# Patient Record
Sex: Male | Born: 1955 | Race: Black or African American | Hispanic: No | Marital: Married | Smoking: Never smoker
Health system: Southern US, Community
[De-identification: ages and names within clinical notes are randomized; demographics above are authoritative.]

## PROBLEM LIST (undated history)

## (undated) DIAGNOSIS — I10 Essential (primary) hypertension: Secondary | ICD-10-CM

## (undated) DIAGNOSIS — E1122 Type 2 diabetes mellitus with diabetic chronic kidney disease: Secondary | ICD-10-CM

## (undated) DIAGNOSIS — N183 Type 2 diabetes mellitus with diabetic chronic kidney disease: Secondary | ICD-10-CM

## (undated) DIAGNOSIS — M5030 Other cervical disc degeneration, unspecified cervical region: Secondary | ICD-10-CM

## (undated) DIAGNOSIS — N289 Disorder of kidney and ureter, unspecified: Secondary | ICD-10-CM

## (undated) DIAGNOSIS — I5032 Chronic diastolic (congestive) heart failure: Secondary | ICD-10-CM

## (undated) DIAGNOSIS — I480 Paroxysmal atrial fibrillation: Secondary | ICD-10-CM

## (undated) DIAGNOSIS — E1169 Type 2 diabetes mellitus with other specified complication: Secondary | ICD-10-CM

## (undated) DIAGNOSIS — E7800 Pure hypercholesterolemia, unspecified: Secondary | ICD-10-CM

## (undated) DIAGNOSIS — IMO0002 Reserved for concepts with insufficient information to code with codable children: Secondary | ICD-10-CM

## (undated) HISTORY — PX: CHOLECYSTECTOMY: SHX55

## (undated) HISTORY — DX: Type 2 diabetes mellitus with diabetic chronic kidney disease: N18.30

## (undated) HISTORY — PX: BOWEL RESECTION: SHX1257

## (undated) HISTORY — PX: NECK SURGERY: SHX720

## (undated) HISTORY — DX: Type 2 diabetes mellitus with other specified complication: E11.69

## (undated) HISTORY — DX: Essential (primary) hypertension: I10

## (undated) HISTORY — PX: TONSILLECTOMY: SUR1361

## (undated) HISTORY — DX: Disorder of kidney and ureter, unspecified: N28.9

## (undated) HISTORY — DX: Chronic kidney disease, stage 3 (moderate): N18.3

## (undated) HISTORY — PX: HERNIA REPAIR: SHX51

## (undated) HISTORY — DX: Type 2 diabetes mellitus with diabetic chronic kidney disease: E11.22

## (undated) HISTORY — DX: Pure hypercholesterolemia, unspecified: E78.00

---

## 1997-04-11 ENCOUNTER — Ambulatory Visit (HOSPITAL_COMMUNITY): Admission: RE | Admit: 1997-04-11 | Discharge: 1997-04-11 | Payer: Self-pay | Admitting: Gastroenterology

## 1998-04-24 ENCOUNTER — Emergency Department (HOSPITAL_COMMUNITY): Admission: EM | Admit: 1998-04-24 | Discharge: 1998-04-24 | Payer: Self-pay | Admitting: Emergency Medicine

## 1999-03-30 ENCOUNTER — Ambulatory Visit (HOSPITAL_COMMUNITY): Admission: RE | Admit: 1999-03-30 | Discharge: 1999-03-30 | Payer: Self-pay | Admitting: Urology

## 2000-08-09 ENCOUNTER — Emergency Department (HOSPITAL_COMMUNITY): Admission: EM | Admit: 2000-08-09 | Discharge: 2000-08-09 | Payer: Self-pay | Admitting: Emergency Medicine

## 2001-07-05 ENCOUNTER — Ambulatory Visit (HOSPITAL_COMMUNITY): Admission: RE | Admit: 2001-07-05 | Discharge: 2001-07-05 | Payer: Self-pay | Admitting: Gastroenterology

## 2001-07-05 ENCOUNTER — Encounter: Payer: Self-pay | Admitting: Gastroenterology

## 2001-08-15 ENCOUNTER — Ambulatory Visit (HOSPITAL_COMMUNITY): Admission: RE | Admit: 2001-08-15 | Discharge: 2001-08-15 | Payer: Self-pay | Admitting: Gastroenterology

## 2001-08-15 ENCOUNTER — Encounter (INDEPENDENT_AMBULATORY_CARE_PROVIDER_SITE_OTHER): Payer: Self-pay | Admitting: Specialist

## 2001-10-24 ENCOUNTER — Encounter: Admission: RE | Admit: 2001-10-24 | Discharge: 2002-01-22 | Payer: Self-pay | Admitting: Internal Medicine

## 2004-01-19 ENCOUNTER — Emergency Department (HOSPITAL_COMMUNITY): Admission: EM | Admit: 2004-01-19 | Discharge: 2004-01-19 | Payer: Self-pay | Admitting: *Deleted

## 2004-05-12 ENCOUNTER — Emergency Department (HOSPITAL_COMMUNITY): Admission: EM | Admit: 2004-05-12 | Discharge: 2004-05-12 | Payer: Self-pay | Admitting: Emergency Medicine

## 2005-03-16 ENCOUNTER — Encounter: Admission: RE | Admit: 2005-03-16 | Discharge: 2005-03-16 | Payer: Self-pay | Admitting: Occupational Medicine

## 2006-06-17 ENCOUNTER — Encounter: Admission: RE | Admit: 2006-06-17 | Discharge: 2006-06-17 | Payer: Self-pay | Admitting: Internal Medicine

## 2006-08-11 ENCOUNTER — Emergency Department (HOSPITAL_COMMUNITY): Admission: EM | Admit: 2006-08-11 | Discharge: 2006-08-11 | Payer: Self-pay | Admitting: Emergency Medicine

## 2007-05-18 ENCOUNTER — Emergency Department (HOSPITAL_COMMUNITY): Admission: EM | Admit: 2007-05-18 | Discharge: 2007-05-18 | Payer: Self-pay | Admitting: Emergency Medicine

## 2007-05-24 ENCOUNTER — Emergency Department (HOSPITAL_COMMUNITY): Admission: EM | Admit: 2007-05-24 | Discharge: 2007-05-24 | Payer: Self-pay | Admitting: Emergency Medicine

## 2007-05-25 ENCOUNTER — Ambulatory Visit (HOSPITAL_COMMUNITY): Admission: RE | Admit: 2007-05-25 | Discharge: 2007-05-25 | Payer: Self-pay | Admitting: Emergency Medicine

## 2007-06-01 ENCOUNTER — Inpatient Hospital Stay (HOSPITAL_COMMUNITY): Admission: RE | Admit: 2007-06-01 | Discharge: 2007-06-04 | Payer: Self-pay | Admitting: Neurosurgery

## 2007-10-05 ENCOUNTER — Encounter: Admission: RE | Admit: 2007-10-05 | Discharge: 2008-01-03 | Payer: Self-pay | Admitting: Occupational Medicine

## 2009-08-18 ENCOUNTER — Observation Stay (HOSPITAL_COMMUNITY): Admission: EM | Admit: 2009-08-18 | Discharge: 2009-08-20 | Payer: Self-pay | Admitting: Emergency Medicine

## 2010-04-26 LAB — CBC
HCT: 40.8 % (ref 39.0–52.0)
Hemoglobin: 14.5 g/dL (ref 13.0–17.0)
MCH: 28.7 pg (ref 26.0–34.0)
MCHC: 35.6 g/dL (ref 30.0–36.0)
MCV: 80.5 fL (ref 78.0–100.0)
Platelets: 139 10*3/uL — ABNORMAL LOW (ref 150–400)
RBC: 5.07 MIL/uL (ref 4.22–5.81)
RDW: 12.6 % (ref 11.5–15.5)
WBC: 5.9 10*3/uL (ref 4.0–10.5)

## 2010-04-26 LAB — COMPREHENSIVE METABOLIC PANEL
ALT: 59 U/L — ABNORMAL HIGH (ref 0–53)
AST: 44 U/L — ABNORMAL HIGH (ref 0–37)
Albumin: 2.9 g/dL — ABNORMAL LOW (ref 3.5–5.2)
Alkaline Phosphatase: 54 U/L (ref 39–117)
BUN: 15 mg/dL (ref 6–23)
CO2: 25 mEq/L (ref 19–32)
Calcium: 8.4 mg/dL (ref 8.4–10.5)
Chloride: 106 mEq/L (ref 96–112)
Creatinine, Ser: 1.07 mg/dL (ref 0.4–1.5)
GFR calc Af Amer: >60 mL/min (ref >60)
GFR calc non Af Amer: >60 mL/min (ref >60)
Glucose, Bld: 158 mg/dL — ABNORMAL HIGH (ref 70–99)
Potassium: 3.8 mEq/L (ref 3.5–5.1)
Sodium: 137 mEq/L (ref 135–145)
Total Bilirubin: 0.6 mg/dL (ref 0.3–1.2)
Total Protein: 6.6 g/dL (ref 6.0–8.3)

## 2010-04-26 LAB — DIFFERENTIAL
Basophils Absolute: 0 10*3/uL (ref 0.0–0.1)
Basophils Relative: 0 % (ref 0–1)
Eosinophils Absolute: 0.3 10*3/uL (ref 0.0–0.7)
Eosinophils Relative: 6 % — ABNORMAL HIGH (ref 0–5)
Lymphocytes Relative: 24 % (ref 12–46)
Lymphs Abs: 1.4 10*3/uL (ref 0.7–4.0)
Monocytes Absolute: 0.7 10*3/uL (ref 0.1–1.0)
Monocytes Relative: 12 % (ref 3–12)
Neutro Abs: 3.5 10*3/uL (ref 1.7–7.7)
Neutrophils Relative %: 58 % (ref 43–77)

## 2010-04-26 LAB — GLUCOSE, CAPILLARY
Glucose-Capillary: 108 mg/dL — ABNORMAL HIGH (ref 70–99)
Glucose-Capillary: 109 mg/dL — ABNORMAL HIGH (ref 70–99)
Glucose-Capillary: 129 mg/dL — ABNORMAL HIGH (ref 70–99)
Glucose-Capillary: 136 mg/dL — ABNORMAL HIGH (ref 70–99)
Glucose-Capillary: 156 mg/dL — ABNORMAL HIGH (ref 70–99)
Glucose-Capillary: 167 mg/dL — ABNORMAL HIGH (ref 70–99)
Glucose-Capillary: 168 mg/dL — ABNORMAL HIGH (ref 70–99)
Glucose-Capillary: 238 mg/dL — ABNORMAL HIGH (ref 70–99)
Glucose-Capillary: 74 mg/dL (ref 70–99)

## 2010-04-26 LAB — URINALYSIS, ROUTINE W REFLEX MICROSCOPIC
Bilirubin Urine: NEGATIVE
Glucose, UA: NEGATIVE mg/dL
Ketones, ur: NEGATIVE mg/dL
Leukocytes, UA: NEGATIVE
Nitrite: NEGATIVE
Protein, ur: >300 mg/dL — AB
Specific Gravity, Urine: 1.019 (ref 1.005–1.030)
Urobilinogen, UA: 1 mg/dL (ref 0.0–1.0)
pH: 5.5 (ref 5.0–8.0)

## 2010-04-26 LAB — HEMOGLOBIN A1C
Hgb A1c MFr Bld: 7.1 % — ABNORMAL HIGH (ref <5.7)
Mean Plasma Glucose: 157 mg/dL — ABNORMAL HIGH (ref <117)

## 2010-04-26 LAB — URINE MICROSCOPIC-ADD ON

## 2010-04-26 LAB — LIPASE, BLOOD: Lipase: 25 U/L (ref 11–59)

## 2010-06-23 NOTE — Op Note (Signed)
NAME:  ARTUR, PUPILLO NO.:  [id number]   MEDICAL RECORD NO.:  [id number]          PATIENT TYPE:  INP   LOCATION:  3109                         FACILITY:  [facility]   PHYSICIAN:  Marchia Meiers. Vertell Limber, M.D.  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   PROCEDURE:  Anterior cervical corpectomy of C4 and C5 with PEEK  interbody cage (38-mm in length packed with morselized bone autograft  and 45-mm anterior cervical plate).   SURGEON:  Marchia Meiers. Vertell Limber, M.D.   ASSISTANT:  Leeroy Cha, M.D.   ANESTHESIA:  General endotracheal anesthesia.   ESTIMATED BLOOD LOSS:  200 mL.   COMPLICATIONS:  None.   DISPOSITION:  Recovery.   INDICATIONS:  Rosemarie Mauricio is a 55 year old man who came to the office  through the emergency room who has Brown-Squard syndrome with left-  sided pain and right greater than left-sided weakness with the cervical  myelopathy with severe spinal cord compression and stenosis with first  cervical canal diameter, measuring 6-mm the midline and more  significantly compressed on the right side of midline.  It was elected  to take him surgery for anterior cervical corpectomy and fusion C3, C4,  and C5 with anterior cervical plating and strut graft from C3-C6 levels.   PROCEDURE:  Mr.  Sowerby was brought to the operating room.  Following  the satisfactory and uncomplicated induction of general endotracheal  anesthesia plus intravenous lines, the patient was placed in supine  position on the  operating table.  The  neck was maintained in neutral  alignment.  His anterior neck was prepped and draped in the usual  sterile fashion.  Area of planned incision was infiltrated with 0.25%  Marcaine and 0.5% lidocaine with 1:200,000 epinephrine.  Incision was  made in a transverse fashion overlying the C4-C5 level on the left side  of midline from the midline to the anterior border sternocleidomastoid  muscle carried sharply through platysmal layer.  Subplatysmal dissection  was performed exposing the anterior border sternocleidomastoid muscle.  Using blunt dissection, the carotid sheath was identified and retracted  and kept lateral.  The trachea, and esophagus kept medial exposing the  anterior cervical spine.  Bent spinal needle was placed what was felt to  be the C4-C5, C5-C6 levels and this was confirmed on intraoperative x-  ray.  Subsequently, further exposure was performed to identify the  cervical spine from C3-C6.  Longus colli muscles were taken down with  the electrocautery and Key elevator and Shadow-line self-retaining  retractor along with up and down retractors were placed.  Distraction  pins using Synthes distraction system were utilized from C3-C6 and with  distraction the cervical kyphosis was corrected.  The interspaces at C3-  C4, C4-C5 and C5-C6 were incised and disk material was removed in  piecemeal fashion and then after that the distraction pins were placed  in an effort to correct cervical kyphosis.  The corpectomy of C4 and C5  was then performed with a high-speed drill with initially under loupe  magnification and completed under the operating microscope.  It was  suspected that the patient had ossification of the posterior  longitudinal ligament, but we are able to get to a plane beneath the  ligament, which was fairly hypertrophied and vascular, but there was  clear separation between the ligament and the dura.  The inferior  endplate of C3 was undercut as  was the superior endplate of C6 and all  the intervening bones and ligament were also removed from overlying the  spinal cord dura.  There are significant decompression of spinal cord  dura, which was seen to be pulsatile beneath the spinal fluid observed  through the translucent dura.  The hemostasis assured with Gelfoam  soaked in thrombin.  It was felt at this point that the kyphosis had  been corrected, the cervical cord been decompressed, and when hemostasis  was assured after trial sizing it was elected to use a 38-mm PEEK  corpectomy cage, which was packed with morselized bone autograft, which  had been retained after drilling of the vertebrae.  The cage was then  placed and the corpectomy defect tamped into position.  Distraction was  removed.  This 10 pounds traction weight was also removed and the cage  appeared to be well positioned.  Subsequently, a 45-mm Trestle anterior  cervical plate was affixed to the anterior cervical spine using fixed  angle 14-mm screws 2 at C3 and 2 at C6.  All screws had excellent  purchase and locking mechanisms were engaged without difficulty.  Final  x-ray demonstrated well-positioned corpectomy cage endplate with  restoration of cervical lordosis, although not completely.  There was  some straightening of the cervical spine, but there appeared to be  significant reduction in the previous kyphosis.  The soft tissues was  inspected, and found to be in good repair. Hemostasis was assured.  A #7  JP drain was inserted through separate stab incision and anchored with  skin stitch.  The platysmal layer was closed with 3-0 Vicryl sutures,  subcutaneous tissues were reapproximated with 3-0 Vicryl interrupted  inverted sutures.  The wound was dressed with benzoin, Steri-Strips,  Telfa gauze, and tape.  The patient was extubated in the operating room  and came to recovery in stable and satisfactory condition having  tolerated this operation well.  Counts  were correct at the end of the  case.      Marchia Meiers. Vertell Limber, M.D.  Electronically Signed     JDS/MEDQ  D:  06/01/2007  T:  06/02/2007  Job:  [id number]

## 2010-06-23 NOTE — Discharge Summary (Signed)
NAME:  Barry Lopez, Barry Lopez NO.:  [id number]   MEDICAL RECORD NO.:  [id number]          PATIENT TYPE:  INP   LOCATION:  3023                         FACILITY:  [facility]   PHYSICIAN:  Ashok Pall, M.D.     DATE OF BIRTH:  [birth year]   DATE OF ADMISSION:  06/01/2007  DATE OF DISCHARGE:  06/04/2007                               DISCHARGE SUMMARY   ADMITTING DIAGNOSES:  Cervical stenosis, kyphotic deformity with  myelopathy, cervical spondylosis with myelopathy.   POSTOPERATIVE DIAGNOSES:  Cervical stenosis, kyphotic deformity with  myelopathy, cervical spondylosis with myelopathy.   PROCEDURE:  C4-C5 corpectomy using a PEEK interbody strut 38-mm anterior  instrumentation.   COMPLICATIONS:  None.   DISCHARGE STATUS:  Alive and well.   DISCHARGE DESTINATION:  Home.  5/5 strength in the upper and lower  extremities.  He is tolerating a regular diet, ambulating well, full use  of the upper and lower extremities.  Wound clean, dry, no signs of  infection.   DISCHARGE MEDICATIONS:  Include Percocet and Flexeril.  He is wearing a  hard cervical collar and was given instructions to keep the collar on  for the next 2 weeks.  And he will follow up with Dr. Jeanie Cooks for  diabetic control.   DISCHARGE DIAGNOSIS:  Uncontrolled diabetes.   [name] will be seen in the office in 2-3 weeks for follow-up.  Will  arrange follow with Dr. Jeanie Cooks.           ______________________________  Ashok Pall, M.D.     KC/MEDQ  D:  06/04/2007  T:  06/05/2007  Job:  [id number]

## 2010-06-26 NOTE — Op Note (Signed)
[place]  Patient:    Barry Lopez, Barry Lopez                       MRN: [id number] Proc. Date: 03/30/99 Adm. Date:  [id number] Disc. Date: [id number] Attending:  Philipp Deputy                           Operative Report  PREOPERATIVE DIAGNOSIS:  Balanitis.  POSTOPERATIVE DIAGNOSIS:  Balanitis.  OPERATION PERFORMED:  Circumcision.  SURGEON:  Mark C. Karsten Ro, M.D.  ANESTHESIA:  General LMA with local supplement.  SPECIMENS:  None.  ESTIMATED BLOOD LOSS:  Less than 10 cc.  DRAINS:  None.  COMPLICATIONS:  None.  INDICATIONS:  The patient is a 55 year old black male diabetic with balanitis. The patient has been irritated by cracking painful foreskin.  The risks, complications and alternatives have been discussed, and he has elected to proceed with circumcision.  DESCRIPTION OF OPERATION:  After informed consent was obtained, the patient was  brought to the operating toom and placed on the table.  The patient was administered general anesthesia, after which his genitalia was sterilely prepped and draped.  A circumcising incision was made circumferentially distally and then proximally. The foreskin was then excised and the bleeding points were cauterized with electrocautery.  The skin edges were then reapproximated with running 3-0 chromic suture.  Then, 20 cc of 0.25% plain Marcaine was used to perform a dorsal penile block.  Neosporin and a sterile dressing were applied.  The patient was awakened and taken to the recovery room in stable and satisfactory condition.  The patient tolerated the procedure well with no intraoperative complications.  Needle, sponge and instrument counts were reportedly correct x 2 at the end of he operation.  The patient will be given prescriptions for Cipro 250 b.i.d. #6, Tylox #40 and he patient had mentioned he had some jock itch, so I gave him some Mycolog cream to be applied b.i.d. for  without weeks; 60 grams was given.  The patient will follow p in my office for recheck in approximately 30 days. DD:  03/30/99 TD:  03/30/99 Job: [id number]

## 2010-06-26 NOTE — Discharge Summary (Signed)
NAME:  AZAVION, BROMMER NO.:  [id number]   MEDICAL RECORD NO.:  [id number]          PATIENT TYPE:  INP   LOCATION:  3023                         FACILITY:  [facility]   PHYSICIAN:  Marchia Meiers. Vertell Limber, M.D.  DATE OF BIRTH:  11/05/1955   DATE OF ADMISSION:  06/01/2007  DATE OF DISCHARGE:  06/04/2007                               DISCHARGE SUMMARY   REASON FOR ADMISSION:  1. Cervical disk herniation with myelopathy.  2. Cervical spondylosis myelopathy.  3. Cervical spinal stenosis.  4. Cervical kyphosis.  5. Hypertension, not otherwise specified.  6. Diabetes type 2.  7. Adverse effects of cortical steroids.  8. Leukocytosis, unspecified.   FINAL DIAGNOSES:  1. Cervical disk herniation with myelopathy.  2. Cervical spondylosis myelopathy.  3. Cervical spinal stenosis.  4. Cervical kyphosis.  5. Hypertension, not otherwise specified.  6. Diabetes type 2.  7. Adverse effects of cortical steroids.  8. Leukocytosis, unspecified.   HISTORY OF PRESENT ILLNESS:  DICTATOR CANCELLED DICTATION AT [facility] D. Vertell Limber, M.D.  Electronically Signed     JDS/MEDQ  D:  06/30/2007  T:  07/01/2007  Job:  [id number]

## 2010-11-03 LAB — BASIC METABOLIC PANEL
BUN: 16
BUN: 18
CO2: 24
CO2: 28
Calcium: 8.4
Calcium: 9.3
Chloride: 106
Chloride: 95 — ABNORMAL LOW
Creatinine, Ser: 0.79
Creatinine, Ser: 1.05
GFR calc Af Amer: >60
GFR calc Af Amer: >60
GFR calc non Af Amer: >60
GFR calc non Af Amer: >60
Glucose, Bld: 119 — ABNORMAL HIGH
Glucose, Bld: 475 — ABNORMAL HIGH
Potassium: 3.6
Potassium: 4.7
Sodium: 129 — ABNORMAL LOW
Sodium: 136

## 2010-11-03 LAB — CBC
HCT: 36.7 — ABNORMAL LOW
HCT: 51
Hemoglobin: 13.1
Hemoglobin: 17.7 — ABNORMAL HIGH
MCHC: 34.8
MCHC: 35.7
MCV: 80.3
MCV: 80.8
Platelets: 134 — ABNORMAL LOW
Platelets: 151
RBC: 4.53
RBC: 6.35 — ABNORMAL HIGH
RDW: 11.6
RDW: 11.7
WBC: 14.5 — ABNORMAL HIGH
WBC: 6.7

## 2010-11-03 LAB — HEMOGLOBIN A1C
Hgb A1c MFr Bld: 11.9 — ABNORMAL HIGH
Mean Plasma Glucose: 346

## 2011-04-11 ENCOUNTER — Other Ambulatory Visit: Payer: Self-pay

## 2011-04-11 ENCOUNTER — Emergency Department (HOSPITAL_COMMUNITY): Payer: BC Managed Care – HMO

## 2011-04-11 ENCOUNTER — Emergency Department (HOSPITAL_COMMUNITY)
Admission: EM | Admit: 2011-04-11 | Discharge: 2011-04-12 | Disposition: A | Discharge status: Home / Self Care | Payer: BC Managed Care – HMO | Attending: Emergency Medicine | Admitting: Emergency Medicine

## 2011-04-11 ENCOUNTER — Encounter (HOSPITAL_COMMUNITY): Payer: Self-pay | Admitting: *Deleted

## 2011-04-11 DIAGNOSIS — E119 Type 2 diabetes mellitus without complications: Secondary | ICD-10-CM | POA: Insufficient documentation

## 2011-04-11 DIAGNOSIS — E7800 Pure hypercholesterolemia, unspecified: Secondary | ICD-10-CM | POA: Insufficient documentation

## 2011-04-11 DIAGNOSIS — R079 Chest pain, unspecified: Secondary | ICD-10-CM | POA: Insufficient documentation

## 2011-04-11 DIAGNOSIS — R0602 Shortness of breath: Secondary | ICD-10-CM | POA: Insufficient documentation

## 2011-04-11 DIAGNOSIS — I10 Essential (primary) hypertension: Secondary | ICD-10-CM | POA: Insufficient documentation

## 2011-04-11 DIAGNOSIS — J40 Bronchitis, not specified as acute or chronic: Secondary | ICD-10-CM

## 2011-04-11 DIAGNOSIS — J3489 Other specified disorders of nose and nasal sinuses: Secondary | ICD-10-CM | POA: Insufficient documentation

## 2011-04-11 HISTORY — DX: Reserved for concepts with insufficient information to code with codable children: IMO0002

## 2011-04-11 HISTORY — DX: Other cervical disc degeneration, unspecified cervical region: M50.30

## 2011-04-11 LAB — BASIC METABOLIC PANEL
BUN: 18 mg/dL (ref 6–23)
CO2: 24 mEq/L (ref 19–32)
Calcium: 8.5 mg/dL (ref 8.4–10.5)
Chloride: 100 mEq/L (ref 96–112)
Creatinine, Ser: 1.16 mg/dL (ref 0.50–1.35)
GFR calc Af Amer: 80 mL/min — ABNORMAL LOW (ref >90)
GFR calc non Af Amer: 69 mL/min — ABNORMAL LOW (ref >90)
Glucose, Bld: 381 mg/dL — ABNORMAL HIGH (ref 70–99)
Potassium: 4 mEq/L (ref 3.5–5.1)
Sodium: 133 mEq/L — ABNORMAL LOW (ref 135–145)

## 2011-04-11 LAB — DIFFERENTIAL
Basophils Absolute: 0 10*3/uL (ref 0.0–0.1)
Basophils Relative: 0 % (ref 0–1)
Eosinophils Absolute: 0.3 10*3/uL (ref 0.0–0.7)
Eosinophils Relative: 6 % — ABNORMAL HIGH (ref 0–5)
Lymphocytes Relative: 27 % (ref 12–46)
Lymphs Abs: 1.5 10*3/uL (ref 0.7–4.0)
Monocytes Absolute: 0.7 10*3/uL (ref 0.1–1.0)
Monocytes Relative: 13 % — ABNORMAL HIGH (ref 3–12)
Neutro Abs: 3 10*3/uL (ref 1.7–7.7)
Neutrophils Relative %: 54 % (ref 43–77)

## 2011-04-11 LAB — CBC
HCT: 33.5 % — ABNORMAL LOW (ref 39.0–52.0)
Hemoglobin: 12.3 g/dL — ABNORMAL LOW (ref 13.0–17.0)
MCH: 27.5 pg (ref 26.0–34.0)
MCHC: 36.7 g/dL — ABNORMAL HIGH (ref 30.0–36.0)
MCV: 74.8 fL — ABNORMAL LOW (ref 78.0–100.0)
Platelets: 150 10*3/uL (ref 150–400)
RBC: 4.48 MIL/uL (ref 4.22–5.81)
RDW: 12.4 % (ref 11.5–15.5)
WBC: 5.8 10*3/uL (ref 4.0–10.5)

## 2011-04-11 LAB — PRO B NATRIURETIC PEPTIDE: Pro B Natriuretic peptide (BNP): 525.9 pg/mL — ABNORMAL HIGH (ref 0–125)

## 2011-04-11 LAB — POCT I-STAT TROPONIN I: Troponin i, poc: 0 ng/mL (ref 0.00–0.08)

## 2011-04-11 LAB — TROPONIN I: Troponin I: <0.3 ng/mL (ref <0.30)

## 2011-04-11 MED ORDER — ALBUTEROL SULFATE (5 MG/ML) 0.5% IN NEBU
2.5000 mg | INHALATION_SOLUTION | RESPIRATORY_TRACT | Status: AC
  Administered 2011-04-11: 2.5 mg via RESPIRATORY_TRACT
  Filled 2011-04-11: qty 0.5

## 2011-04-11 MED ORDER — PREDNISONE 10 MG PO TABS: 60.0000 mg | ORAL_TABLET | Freq: Every day | ORAL | Status: AC

## 2011-04-11 MED ORDER — ALBUTEROL SULFATE HFA 108 (90 BASE) MCG/ACT IN AERS
1.0000 | INHALATION_SPRAY | RESPIRATORY_TRACT | Status: DC | PRN
  Administered 2011-04-12: 2 via RESPIRATORY_TRACT
  Filled 2011-04-11: qty 6.7

## 2011-04-11 MED ORDER — PREDNISONE 20 MG PO TABS
60.0000 mg | ORAL_TABLET | ORAL | Status: AC
  Administered 2011-04-12: 60 mg via ORAL
  Filled 2011-04-11: qty 3

## 2011-04-11 MED ORDER — AZITHROMYCIN 250 MG PO TABS: 250.0000 mg | ORAL_TABLET | Freq: Every day | ORAL | Status: AC

## 2011-04-11 MED ORDER — AZITHROMYCIN 250 MG PO TABS
500.0000 mg | ORAL_TABLET | Freq: Once | ORAL | Status: AC
  Administered 2011-04-11: 500 mg via ORAL
  Filled 2011-04-11: qty 2

## 2011-04-11 NOTE — ED Notes (Signed)
Pt in no apparent distress with wife at bedside. Wife given a cup of ice water per her request.

## 2011-04-11 NOTE — ED Notes (Signed)
Pt reports symptoms started last week with lower back pain. Pt also having sinus congestion and PND. Symptoms continued to worsen with cough, headache and chest pain. Pt reports last week started having sharp right arm pain and numbness with cough. Chest Pain is centrally located.

## 2011-04-11 NOTE — ED Notes (Signed)
Lab called for add-on labs.  ?

## 2011-04-11 NOTE — Discharge Instructions (Signed)
Please be sure to discuss tonight's presentation to the emergency department with your physician.  You need to be sure to have another chest x-ray to ensure resolution of your illness.  Bronchitis Bronchitis is the body's way of reacting to injury and/or infection (inflammation) of the bronchi. Bronchi are the air tubes that extend from the windpipe into the lungs. If the inflammation becomes severe, it may cause shortness of breath. CAUSES  Inflammation may be caused by:  A virus.   Germs (bacteria).   Dust.   Allergens.   Pollutants and many other irritants.  The cells lining the bronchial tree are covered with tiny hairs (cilia). These constantly beat upward, away from the lungs, toward the mouth. This keeps the lungs free of pollutants. When these cells become too irritated and are unable to do their job, mucus begins to develop. This causes the characteristic cough of bronchitis. The cough clears the lungs when the cilia are unable to do their job. Without either of these protective mechanisms, the mucus would settle in the lungs. Then you would develop pneumonia. Smoking is a common cause of bronchitis and can contribute to pneumonia. Stopping this habit is the single most important thing you can do to help yourself. TREATMENT   Your caregiver may prescribe an antibiotic if the cough is caused by bacteria. Also, medicines that open up your airways make it easier to breathe. Your caregiver may also recommend or prescribe an expectorant. It will loosen the mucus to be coughed up. Only take over-the-counter or prescription medicines for pain, discomfort, or fever as directed by your caregiver.   Removing whatever causes the problem (smoking, for example) is critical to preventing the problem from getting worse.   Cough suppressants may be prescribed for relief of cough symptoms.   Inhaled medicines may be prescribed to help with symptoms now and to help prevent problems from returning.     For those with recurrent (chronic) bronchitis, there may be a need for steroid medicines.  SEEK IMMEDIATE MEDICAL CARE IF:   During treatment, you develop more pus-like mucus (purulent sputum).   You have a fever.   Your baby is older than 3 months with a rectal temperature of 102 F (38.9 C) or higher.   Your baby is 52 months old or younger with a rectal temperature of 100.4 F (38 C) or higher.   You become progressively more ill.   You have increased difficulty breathing, wheezing, or shortness of breath.  It is necessary to seek immediate medical care if you are elderly or sick from any other disease. MAKE SURE YOU:   Understand these instructions.   Will watch your condition.   Will get help right away if you are not doing well or get worse.  Document Released: 01/25/2005 Document Revised: 01/14/2011 Document Reviewed: 12/05/2007 [facility] Patient Information 2012 [place].

## 2011-04-11 NOTE — ED Notes (Signed)
resp called for neb 

## 2011-04-11 NOTE — ED Provider Notes (Signed)
History     CSN: [id number]  Arrival date & time 04/11/11  1952   First MD Initiated Contact with Patient 04/11/11 2054      Chief Complaint  Patient presents with  . Chest Pain  . Nasal Congestion    Patient is a 56 y.o. male presenting with chest pain.  Chest Pain Pertinent negatives for primary symptoms include no vomiting.    the patient presents with one week of chest pain, cough, congestion and PND.  Sx began gradually and since onset have been persistent.  No relief w OTC medications.  No dyspnea.  No peripheral edema.  He notes R arm dysesthesia w prolonged coughing spells. No f/c, though there was a fever at the onset of Sx.  Past Medical History  Diagnosis Date  . Diabetes mellitus   . Hypertension   . Hypercholesteremia   . Degenerative disc disease   . Degenerative cervical disc     Past Surgical History  Procedure Date  . Bowel resection   . Neck surgery   . Cholecystectomy   . Tonsillectomy     Family History  Problem Relation Age of Onset  . Heart failure Mother     History  Substance Use Topics  . Smoking status: Never Smoker   . Smokeless tobacco: Not on file  . Alcohol Use: No      Review of Systems  Constitutional:       Per HPI, otherwise negative  HENT:       Per HPI, otherwise negative  Eyes: Negative.   Respiratory:       Per HPI, otherwise negative  Cardiovascular: Positive for chest pain.       Per HPI, otherwise negative  Gastrointestinal: Negative for vomiting.  Genitourinary: Negative.   Musculoskeletal:       Per HPI, otherwise negative  Skin: Negative.   Neurological: Negative for syncope.    Allergies  Penicillins  Home Medications   Current Outpatient Rx  Name Route Sig Dispense Refill  . IBUPROFEN 200 MG PO TABS Oral Take 200 mg by mouth every 6 (six) hours as needed. Pain.    [name] LISINOPRIL PO Oral Take by mouth.    . METFORMIN HCL 500 MG PO TABS Oral Take 500 mg by mouth 2 (two) times daily with a meal.      . NAPROSYN PO Oral Take 1 tablet by mouth 2 (two) times daily.    [name] SIMVASTATIN PO Oral Take by mouth.    [name] JANUVIA PO Oral Take by mouth.    [name] VITAMIN D (ERGOCALCIFEROL) 50000 UNITS PO CAPS Oral Take 50,000 Units by mouth every 7 (seven) days.      BP 169/87  Pulse 86  Temp(Src) 98.2 F (36.8 C) (Oral)  Resp 16  SpO2 99%  Physical Exam  Nursing note and vitals reviewed. Constitutional: He is oriented to person, place, and time. He appears well-developed. No distress.  HENT:  Head: Normocephalic and atraumatic.  Eyes: Conjunctivae and EOM are normal.  Cardiovascular: Normal rate and regular rhythm.   Pulmonary/Chest: Effort normal. No stridor. No respiratory distress.  Abdominal: He exhibits no distension.  Musculoskeletal: He exhibits no edema.  Neurological: He is alert and oriented to person, place, and time.  Skin: Skin is warm and dry.  Psychiatric: He has a normal mood and affect.    ED Course  Procedures (including critical care time)  Labs Reviewed  CBC - Abnormal; Notable for the following:  Hemoglobin 12.3 (*)    HCT 33.5 (*)    MCV 74.8 (*)    MCHC 36.7 (*)    All other components within normal limits  BASIC METABOLIC PANEL - Abnormal; Notable for the following:    Sodium 133 (*)    Glucose, Bld 381 (*)    GFR calc non Af Amer 69 (*)    GFR calc Af Amer 80 (*)    All other components within normal limits  POCT I-STAT TROPONIN I  DIFFERENTIAL  TROPONIN I   No results found.   No diagnosis found.  Cardiac: 86 sr, normal Pulse OX 99%ra- normal   Date: 04/11/2011  Rate: 82  Rhythm: normal sinus rhythm  QRS Axis: normal  Intervals: normal  ST/T Wave abnormalities: nonspecific ST/T changes  Conduction Disutrbances:none  Narrative Interpretation:   Old EKG Reviewed: unchanged from 2011 ABNORMAL ECG  CXR reviewed by me No pleural congestion, perihilar opacification  MDM  This generally well patient presents with one week of cough,  congestion, mild chest pain.  The patient's denial of exertional or pleuritic pain his reassuring.  The patient's absence of factors for PE is also reassuring.  The patient's ECG tonight is the same as his most recent, and his troponin was negative.  The patient's presentation is most consistent with bronchitis.  The patient's BNP was mildly elevated, he has slightly decreased renal function, and there is no peripheral edema, and no x-ray findings consistent with heart failure and borderline heart size.  Given these findings the patient was discharged with albuterol inhaler, steroids, antibiotics.  He will follow up with his primary care physician this week and was instructed to be sure to have another x-ray to ensure resolution of his condition.        Carmin Muskrat, MD 04/11/11 [phone]

## 2011-12-01 ENCOUNTER — Other Ambulatory Visit: Payer: Self-pay | Admitting: Neurosurgery

## 2011-12-01 DIAGNOSIS — M4712 Other spondylosis with myelopathy, cervical region: Secondary | ICD-10-CM

## 2011-12-01 DIAGNOSIS — M5000 Cervical disc disorder with myelopathy, unspecified cervical region: Secondary | ICD-10-CM

## 2011-12-01 DIAGNOSIS — M4802 Spinal stenosis, cervical region: Secondary | ICD-10-CM

## 2011-12-01 DIAGNOSIS — M542 Cervicalgia: Secondary | ICD-10-CM

## 2011-12-02 ENCOUNTER — Ambulatory Visit
Admission: RE | Admit: 2011-12-02 | Discharge: 2011-12-02 | Disposition: A | Discharge status: Home / Self Care | Payer: BC Managed Care – PPO | Source: Ambulatory Visit | Attending: Neurosurgery | Admitting: Neurosurgery

## 2011-12-02 DIAGNOSIS — M4802 Spinal stenosis, cervical region: Secondary | ICD-10-CM

## 2011-12-02 DIAGNOSIS — M542 Cervicalgia: Secondary | ICD-10-CM

## 2011-12-02 DIAGNOSIS — M5000 Cervical disc disorder with myelopathy, unspecified cervical region: Secondary | ICD-10-CM

## 2011-12-02 DIAGNOSIS — M4712 Other spondylosis with myelopathy, cervical region: Secondary | ICD-10-CM

## 2011-12-07 ENCOUNTER — Ambulatory Visit
Admission: RE | Admit: 2011-12-07 | Discharge: 2011-12-07 | Disposition: A | Discharge status: Home / Self Care | Payer: BC Managed Care – PPO | Source: Ambulatory Visit | Attending: Neurosurgery | Admitting: Neurosurgery

## 2011-12-07 ENCOUNTER — Other Ambulatory Visit: Payer: Self-pay | Admitting: Neurosurgery

## 2011-12-07 DIAGNOSIS — Z967 Presence of other bone and tendon implants: Secondary | ICD-10-CM

## 2011-12-07 MED ORDER — IOHEXOL 300 MG/ML  SOLN
100.0000 mL | Freq: Once | INTRAMUSCULAR | Status: AC | PRN
  Administered 2011-12-07: 100 mL via INTRAVENOUS

## 2011-12-08 ENCOUNTER — Other Ambulatory Visit: Payer: Self-pay | Admitting: Neurosurgery

## 2011-12-09 ENCOUNTER — Encounter (HOSPITAL_COMMUNITY): Payer: Self-pay | Admitting: Pharmacy Technician

## 2011-12-14 ENCOUNTER — Encounter (HOSPITAL_COMMUNITY)
Admission: RE | Admit: 2011-12-14 | Discharge: 2011-12-14 | Disposition: A | Discharge status: Home / Self Care | Payer: BC Managed Care – PPO | Source: Ambulatory Visit | Attending: Neurosurgery | Admitting: Neurosurgery

## 2011-12-14 ENCOUNTER — Encounter (HOSPITAL_COMMUNITY): Payer: Self-pay

## 2011-12-14 ENCOUNTER — Encounter (HOSPITAL_COMMUNITY)
Admission: RE | Admit: 2011-12-14 | Discharge: 2011-12-14 | Disposition: A | Discharge status: Home / Self Care | Payer: BC Managed Care – PPO | Source: Ambulatory Visit | Attending: Anesthesiology | Admitting: Anesthesiology

## 2011-12-14 LAB — BASIC METABOLIC PANEL
BUN: 19 mg/dL (ref 6–23)
CO2: 25 mEq/L (ref 19–32)
Calcium: 9.5 mg/dL (ref 8.4–10.5)
Chloride: 101 mEq/L (ref 96–112)
Creatinine, Ser: 1.24 mg/dL (ref 0.50–1.35)
GFR calc Af Amer: 73 mL/min — ABNORMAL LOW (ref >90)
GFR calc non Af Amer: 63 mL/min — ABNORMAL LOW (ref >90)
Glucose, Bld: 147 mg/dL — ABNORMAL HIGH (ref 70–99)
Potassium: 4.2 mEq/L (ref 3.5–5.1)
Sodium: 134 mEq/L — ABNORMAL LOW (ref 135–145)

## 2011-12-14 LAB — CBC
HCT: 41.7 % (ref 39.0–52.0)
Hemoglobin: 15.2 g/dL (ref 13.0–17.0)
MCH: 27.7 pg (ref 26.0–34.0)
MCHC: 36.5 g/dL — ABNORMAL HIGH (ref 30.0–36.0)
MCV: 76 fL — ABNORMAL LOW (ref 78.0–100.0)
Platelets: 154 10*3/uL (ref 150–400)
RBC: 5.49 MIL/uL (ref 4.22–5.81)
RDW: 12.4 % (ref 11.5–15.5)
WBC: 6.5 10*3/uL (ref 4.0–10.5)

## 2011-12-14 LAB — SURGICAL PCR SCREEN
MRSA, PCR: NEGATIVE
Staphylococcus aureus: NEGATIVE

## 2011-12-14 NOTE — Pre-Procedure Instructions (Signed)
[address] [facility]  12/14/2011   Your procedure is scheduled on: Friday 12/17/11   Report to [facility] at 95 AM.  Call this number if you have problems the morning of surgery: [phone]   Remember:   Do not eat food OR DRINK :After Midnight.   Take these medicines the morning of surgery with A SIP OF WATER: STOP ASPIRIN, COUMADIN ,PLAVIX, EFFIENT, HERBAL MEDICINES)   Do not wear jewelry, make-up or nail polish.  Do not wear lotions, powders, or perfumes. You may wear deodorant.  Do not shave 48 hours prior to surgery. Men may shave face and neck.  Do not bring valuables to the hospital.  Contacts, dentures or bridgework may not be worn into surgery.  Leave suitcase in the car. After surgery it may be brought to your room.  For patients admitted to the hospital, checkout time is 11:00 AM the day of discharge.   Patients discharged the day of surgery will not be allowed to drive home.  Name and phone number of your driver:   Special Instructions: Shower using CHG 2 nights before surgery and the night before surgery.  If you shower the day of surgery use CHG.  Use special wash - you have one bottle of CHG for all showers.  You should use approximately 1/3 of the bottle for each shower.   Please read over the following fact sheets that you were given: Pain Booklet, Coughing and Deep Breathing, MRSA Information and Surgical Site Infection Prevention

## 2011-12-15 ENCOUNTER — Other Ambulatory Visit: Payer: Self-pay | Admitting: Neurosurgery

## 2011-12-15 NOTE — Progress Notes (Signed)
New order for consent placed but  Has not been signed. Left message for Barry Lopez that order needs to be signed.

## 2011-12-15 NOTE — Progress Notes (Signed)
Spoke with Janett Billow in office concerning pt. Question about consent .

## 2011-12-16 MED ORDER — VANCOMYCIN HCL IN DEXTROSE 1-5 GM/200ML-% IV SOLN
1000.0000 mg | INTRAVENOUS | Status: AC
  Administered 2011-12-17: 1000 mg via INTRAVENOUS

## 2011-12-16 NOTE — Progress Notes (Signed)
DR. Melven Sartorius OFFICE MADE AWARE OF NEW CONSENT ORDER NEEDING TO BE SIGNED.

## 2011-12-17 ENCOUNTER — Encounter (HOSPITAL_COMMUNITY): Payer: Self-pay | Admitting: Anesthesiology

## 2011-12-17 ENCOUNTER — Ambulatory Visit (HOSPITAL_COMMUNITY): Payer: BC Managed Care – PPO

## 2011-12-17 ENCOUNTER — Encounter (HOSPITAL_COMMUNITY)
Admission: RE | Disposition: A | Discharge status: Home / Self Care | Payer: Self-pay | Source: Ambulatory Visit | Attending: Neurosurgery

## 2011-12-17 ENCOUNTER — Ambulatory Visit (HOSPITAL_COMMUNITY): Payer: BC Managed Care – PPO | Admitting: Anesthesiology

## 2011-12-17 ENCOUNTER — Inpatient Hospital Stay (HOSPITAL_COMMUNITY)
Admission: RE | Admit: 2011-12-17 | Discharge: 2011-12-20 | DRG: 806 | Disposition: A | Discharge status: Home / Self Care | Payer: BC Managed Care – PPO | Source: Ambulatory Visit | Attending: Neurosurgery | Admitting: Neurosurgery

## 2011-12-17 ENCOUNTER — Encounter (HOSPITAL_COMMUNITY): Payer: Self-pay | Admitting: *Deleted

## 2011-12-17 DIAGNOSIS — Y832 Surgical operation with anastomosis, bypass or graft as the cause of abnormal reaction of the patient, or of later complication, without mention of misadventure at the time of the procedure: Secondary | ICD-10-CM | POA: Diagnosis present

## 2011-12-17 DIAGNOSIS — M4712 Other spondylosis with myelopathy, cervical region: Principal | ICD-10-CM | POA: Diagnosis present

## 2011-12-17 DIAGNOSIS — Z01812 Encounter for preprocedural laboratory examination: Secondary | ICD-10-CM

## 2011-12-17 DIAGNOSIS — I10 Essential (primary) hypertension: Secondary | ICD-10-CM | POA: Diagnosis present

## 2011-12-17 DIAGNOSIS — Z01818 Encounter for other preprocedural examination: Secondary | ICD-10-CM

## 2011-12-17 DIAGNOSIS — M129 Arthropathy, unspecified: Secondary | ICD-10-CM | POA: Diagnosis present

## 2011-12-17 DIAGNOSIS — Z01810 Encounter for preprocedural cardiovascular examination: Secondary | ICD-10-CM

## 2011-12-17 DIAGNOSIS — E119 Type 2 diabetes mellitus without complications: Secondary | ICD-10-CM | POA: Diagnosis present

## 2011-12-17 DIAGNOSIS — T84498A Other mechanical complication of other internal orthopedic devices, implants and grafts, initial encounter: Secondary | ICD-10-CM | POA: Diagnosis present

## 2011-12-17 HISTORY — PX: ANTERIOR CERVICAL DECOMP/DISCECTOMY FUSION: SHX1161

## 2011-12-17 HISTORY — PX: POSTERIOR CERVICAL FUSION/FORAMINOTOMY: SHX5038

## 2011-12-17 LAB — GLUCOSE, CAPILLARY
Glucose-Capillary: 135 mg/dL — ABNORMAL HIGH (ref 70–99)
Glucose-Capillary: 121 mg/dL — ABNORMAL HIGH (ref 70–99)
Glucose-Capillary: 183 mg/dL — ABNORMAL HIGH (ref 70–99)
Glucose-Capillary: 243 mg/dL — ABNORMAL HIGH (ref 70–99)

## 2011-12-17 LAB — MRSA PCR SCREENING: MRSA by PCR: NEGATIVE

## 2011-12-17 SURGERY — ANTERIOR CERVICAL DECOMPRESSION/DISCECTOMY FUSION 1 LEVEL
Anesthesia: General | Site: Neck | Wound class: Clean

## 2011-12-17 MED ORDER — OXYCODONE HCL 5 MG/5ML PO SOLN: 5.0000 mg | Freq: Once | ORAL | Status: DC | PRN

## 2011-12-17 MED ORDER — ACETAMINOPHEN 325 MG PO TABS: 650.0000 mg | ORAL_TABLET | ORAL | Status: DC | PRN

## 2011-12-17 MED ORDER — ONDANSETRON HCL 4 MG/2ML IJ SOLN
4.0000 mg | INTRAMUSCULAR | Status: DC | PRN
  Administered 2011-12-18 (×2): 4 mg via INTRAVENOUS
  Filled 2011-12-17 (×2): qty 2

## 2011-12-17 MED ORDER — ACETAMINOPHEN 650 MG RE SUPP: 650.0000 mg | RECTAL | Status: DC | PRN

## 2011-12-17 MED ORDER — LIDOCAINE HCL (CARDIAC) 20 MG/ML IV SOLN
INTRAVENOUS | Status: DC | PRN
  Administered 2011-12-17: 80 mg via INTRAVENOUS

## 2011-12-17 MED ORDER — SODIUM CHLORIDE 0.9 % IV SOLN
INTRAVENOUS | Status: DC | PRN
  Administered 2011-12-17 (×2): via INTRAVENOUS

## 2011-12-17 MED ORDER — LABETALOL HCL 5 MG/ML IV SOLN
INTRAVENOUS | Status: DC | PRN
  Administered 2011-12-17: 5 mg via INTRAVENOUS

## 2011-12-17 MED ORDER — KCL IN DEXTROSE-NACL 20-5-0.45 MEQ/L-%-% IV SOLN
INTRAVENOUS | Status: AC
  Filled 2011-12-17: qty 1000

## 2011-12-17 MED ORDER — INSULIN ASPART 100 UNIT/ML ~~LOC~~ SOLN
4.0000 [IU] | Freq: Three times a day (TID) | SUBCUTANEOUS | Status: DC
  Administered 2011-12-18 – 2011-12-20 (×5): 4 [IU] via SUBCUTANEOUS

## 2011-12-17 MED ORDER — ROCURONIUM BROMIDE 100 MG/10ML IV SOLN
INTRAVENOUS | Status: DC | PRN
  Administered 2011-12-17: 50 mg via INTRAVENOUS

## 2011-12-17 MED ORDER — SODIUM CHLORIDE 0.9 % IR SOLN
Status: DC | PRN
  Administered 2011-12-17: 15:00:00

## 2011-12-17 MED ORDER — ZOLPIDEM TARTRATE 5 MG PO TABS: 5.0000 mg | ORAL_TABLET | Freq: Every evening | ORAL | Status: DC | PRN

## 2011-12-17 MED ORDER — DIAZEPAM 5 MG/ML IJ SOLN
INTRAMUSCULAR | Status: AC
  Administered 2011-12-17: 5 mg
  Filled 2011-12-17: qty 2

## 2011-12-17 MED ORDER — BACITRACIN 50000 UNITS IM SOLR
INTRAMUSCULAR | Status: AC
  Filled 2011-12-17: qty 1

## 2011-12-17 MED ORDER — ACETAMINOPHEN 10 MG/ML IV SOLN
1000.0000 mg | Freq: Once | INTRAVENOUS | Status: AC
  Administered 2011-12-17: 1000 mg via INTRAVENOUS
  Filled 2011-12-17: qty 100

## 2011-12-17 MED ORDER — HYDROMORPHONE HCL PF 1 MG/ML IJ SOLN
0.2500 mg | INTRAMUSCULAR | Status: DC | PRN
  Administered 2011-12-17 (×2): 0.5 mg via INTRAVENOUS

## 2011-12-17 MED ORDER — SODIUM CHLORIDE 0.9 % IJ SOLN
3.0000 mL | Freq: Two times a day (BID) | INTRAMUSCULAR | Status: DC
  Administered 2011-12-18 (×2): 3 mL via INTRAVENOUS

## 2011-12-17 MED ORDER — FENTANYL CITRATE 0.05 MG/ML IJ SOLN
INTRAMUSCULAR | Status: DC | PRN
  Administered 2011-12-17 (×6): 50 ug via INTRAVENOUS
  Administered 2011-12-17: 150 ug via INTRAVENOUS
  Administered 2011-12-17: 50 ug via INTRAVENOUS

## 2011-12-17 MED ORDER — VECURONIUM BROMIDE 10 MG IV SOLR
INTRAVENOUS | Status: DC | PRN
  Administered 2011-12-17 (×4): 2 mg via INTRAVENOUS
  Administered 2011-12-17 (×2): 1 mg via INTRAVENOUS

## 2011-12-17 MED ORDER — SIMVASTATIN 20 MG PO TABS
20.0000 mg | ORAL_TABLET | Freq: Every day | ORAL | Status: DC
  Administered 2011-12-17 – 2011-12-20 (×4): 20 mg via ORAL
  Filled 2011-12-17 (×4): qty 1

## 2011-12-17 MED ORDER — MIDAZOLAM HCL 5 MG/5ML IJ SOLN
INTRAMUSCULAR | Status: DC | PRN
  Administered 2011-12-17: 2 mg via INTRAVENOUS

## 2011-12-17 MED ORDER — PHENOL 1.4 % MT LIQD
1.0000 | OROMUCOSAL | Status: DC | PRN
Start: 2011-12-17 — End: 2011-12-20

## 2011-12-17 MED ORDER — OXYCODONE-ACETAMINOPHEN 5-325 MG PO TABS
1.0000 | ORAL_TABLET | ORAL | Status: DC | PRN
  Administered 2011-12-17 – 2011-12-20 (×5): 2 via ORAL
  Filled 2011-12-17 (×5): qty 2

## 2011-12-17 MED ORDER — PROPOFOL 10 MG/ML IV BOLUS
INTRAVENOUS | Status: DC | PRN
  Administered 2011-12-17: 200 mg via INTRAVENOUS
  Administered 2011-12-17: 50 mg via INTRAVENOUS

## 2011-12-17 MED ORDER — FLEET ENEMA 7-19 GM/118ML RE ENEM
1.0000 | ENEMA | Freq: Once | RECTAL | Status: AC | PRN
  Filled 2011-12-17: qty 1

## 2011-12-17 MED ORDER — LIDOCAINE HCL 4 % MT SOLN
OROMUCOSAL | Status: DC | PRN
  Administered 2011-12-17: 4 mL via TOPICAL

## 2011-12-17 MED ORDER — SENNA 8.6 MG PO TABS
1.0000 | ORAL_TABLET | Freq: Two times a day (BID) | ORAL | Status: DC
  Administered 2011-12-17 – 2011-12-20 (×5): 8.6 mg via ORAL
  Filled 2011-12-17 (×7): qty 1

## 2011-12-17 MED ORDER — BISACODYL 10 MG RE SUPP: 10.0000 mg | Freq: Every day | RECTAL | Status: DC | PRN

## 2011-12-17 MED ORDER — DIAZEPAM 5 MG PO TABS: 5.0000 mg | ORAL_TABLET | Freq: Four times a day (QID) | ORAL | Status: DC | PRN

## 2011-12-17 MED ORDER — 0.9 % SODIUM CHLORIDE (POUR BTL) OPTIME
TOPICAL | Status: DC | PRN
  Administered 2011-12-17: 1000 mL

## 2011-12-17 MED ORDER — ONDANSETRON HCL 4 MG/2ML IJ SOLN: 4.0000 mg | Freq: Four times a day (QID) | INTRAMUSCULAR | Status: DC | PRN

## 2011-12-17 MED ORDER — THROMBIN 20000 UNITS EX SOLR
CUTANEOUS | Status: DC | PRN
  Administered 2011-12-17: 15:00:00 via TOPICAL

## 2011-12-17 MED ORDER — HYDROMORPHONE HCL PF 1 MG/ML IJ SOLN
INTRAMUSCULAR | Status: AC
  Filled 2011-12-17: qty 1

## 2011-12-17 MED ORDER — ACETAMINOPHEN 10 MG/ML IV SOLN
INTRAVENOUS | Status: AC
  Filled 2011-12-17: qty 100

## 2011-12-17 MED ORDER — INSULIN ASPART 100 UNIT/ML ~~LOC~~ SOLN
0.0000 [IU] | Freq: Three times a day (TID) | SUBCUTANEOUS | Status: DC
Start: 2011-12-18 — End: 2011-12-20
  Administered 2011-12-18 (×3): 5 [IU] via SUBCUTANEOUS
  Administered 2011-12-19: 2 [IU] via SUBCUTANEOUS
  Administered 2011-12-19 (×2): 5 [IU] via SUBCUTANEOUS
  Administered 2011-12-20: 3 [IU] via SUBCUTANEOUS

## 2011-12-17 MED ORDER — ONDANSETRON HCL 4 MG/2ML IJ SOLN
INTRAMUSCULAR | Status: DC | PRN
  Administered 2011-12-17: 4 mg via INTRAVENOUS

## 2011-12-17 MED ORDER — DOCUSATE SODIUM 100 MG PO CAPS
100.0000 mg | ORAL_CAPSULE | Freq: Two times a day (BID) | ORAL | Status: DC
  Administered 2011-12-17 – 2011-12-20 (×5): 100 mg via ORAL
  Filled 2011-12-17 (×6): qty 1

## 2011-12-17 MED ORDER — INSULIN ASPART 100 UNIT/ML ~~LOC~~ SOLN
0.0000 [IU] | Freq: Every day | SUBCUTANEOUS | Status: DC
  Administered 2011-12-17: 2 [IU] via SUBCUTANEOUS

## 2011-12-17 MED ORDER — VITAMIN D (ERGOCALCIFEROL) 1.25 MG (50000 UNIT) PO CAPS
50000.0000 [IU] | ORAL_CAPSULE | ORAL | Status: DC
  Administered 2011-12-17: 50000 [IU] via ORAL
  Filled 2011-12-17: qty 1

## 2011-12-17 MED ORDER — DEXAMETHASONE SODIUM PHOSPHATE 10 MG/ML IJ SOLN
10.0000 mg | Freq: Once | INTRAMUSCULAR | Status: AC
  Administered 2011-12-17: 10 mg via INTRAVENOUS
  Filled 2011-12-17: qty 1

## 2011-12-17 MED ORDER — BACITRACIN ZINC 500 UNIT/GM EX OINT
TOPICAL_OINTMENT | CUTANEOUS | Status: DC | PRN
  Administered 2011-12-17: 1 via TOPICAL

## 2011-12-17 MED ORDER — MORPHINE SULFATE 2 MG/ML IJ SOLN
1.0000 mg | INTRAMUSCULAR | Status: DC | PRN
  Administered 2011-12-17: 2 mg via INTRAVENOUS
  Administered 2011-12-18 (×3): 4 mg via INTRAVENOUS
  Administered 2011-12-19: 2 mg via INTRAVENOUS
  Filled 2011-12-17 (×2): qty 1
  Filled 2011-12-17 (×3): qty 2

## 2011-12-17 MED ORDER — LIDOCAINE-EPINEPHRINE 1 %-1:100000 IJ SOLN
INTRAMUSCULAR | Status: DC | PRN
  Administered 2011-12-17: 10 mL
  Administered 2011-12-17: 7 mL

## 2011-12-17 MED ORDER — NEOSTIGMINE METHYLSULFATE 1 MG/ML IJ SOLN
INTRAMUSCULAR | Status: DC | PRN
  Administered 2011-12-17: 3 mg via INTRAVENOUS

## 2011-12-17 MED ORDER — LISINOPRIL 20 MG PO TABS
20.0000 mg | ORAL_TABLET | Freq: Two times a day (BID) | ORAL | Status: DC
  Administered 2011-12-17 – 2011-12-20 (×6): 20 mg via ORAL
  Filled 2011-12-17 (×7): qty 1

## 2011-12-17 MED ORDER — OXYCODONE HCL 5 MG PO TABS: 5.0000 mg | ORAL_TABLET | Freq: Once | ORAL | Status: DC | PRN

## 2011-12-17 MED ORDER — MENTHOL 3 MG MT LOZG: 1.0000 | LOZENGE | OROMUCOSAL | Status: DC | PRN

## 2011-12-17 MED ORDER — ARTIFICIAL TEARS OP OINT
TOPICAL_OINTMENT | OPHTHALMIC | Status: DC | PRN
  Administered 2011-12-17: 1 via OPHTHALMIC

## 2011-12-17 MED ORDER — HYDROCODONE-ACETAMINOPHEN 5-325 MG PO TABS
1.0000 | ORAL_TABLET | ORAL | Status: DC | PRN
  Administered 2011-12-18: 2 via ORAL
  Filled 2011-12-17: qty 2

## 2011-12-17 MED ORDER — LINAGLIPTIN 5 MG PO TABS
5.0000 mg | ORAL_TABLET | Freq: Every day | ORAL | Status: DC
  Administered 2011-12-17 – 2011-12-20 (×4): 5 mg via ORAL
  Filled 2011-12-17 (×4): qty 1

## 2011-12-17 MED ORDER — KCL IN DEXTROSE-NACL 20-5-0.45 MEQ/L-%-% IV SOLN
INTRAVENOUS | Status: DC
  Administered 2011-12-17: 21:00:00 via INTRAVENOUS
  Filled 2011-12-17 (×3): qty 1000

## 2011-12-17 MED ORDER — PANTOPRAZOLE SODIUM 40 MG IV SOLR
40.0000 mg | Freq: Every day | INTRAVENOUS | Status: DC
  Administered 2011-12-17: 40 mg via INTRAVENOUS
  Filled 2011-12-17 (×2): qty 40

## 2011-12-17 MED ORDER — GLYCOPYRROLATE 0.2 MG/ML IJ SOLN
INTRAMUSCULAR | Status: DC | PRN
  Administered 2011-12-17: 0.4 mg via INTRAVENOUS

## 2011-12-17 MED ORDER — VANCOMYCIN HCL 1000 MG IV SOLR
1000.0000 mg | INTRAVENOUS | Status: DC
  Filled 2011-12-17: qty 1000

## 2011-12-17 MED ORDER — METFORMIN HCL 500 MG PO TABS
500.0000 mg | ORAL_TABLET | Freq: Two times a day (BID) | ORAL | Status: DC
  Administered 2011-12-18 – 2011-12-20 (×5): 500 mg via ORAL
  Filled 2011-12-17 (×8): qty 1

## 2011-12-17 MED ORDER — SODIUM CHLORIDE 0.9 % IV SOLN
INTRAVENOUS | Status: AC
  Filled 2011-12-17: qty 500

## 2011-12-17 MED ORDER — SODIUM CHLORIDE 0.9 % IV SOLN: 250.0000 mL | INTRAVENOUS | Status: DC

## 2011-12-17 MED ORDER — SODIUM CHLORIDE 0.9 % IJ SOLN: 3.0000 mL | INTRAMUSCULAR | Status: DC | PRN

## 2011-12-17 MED ORDER — VANCOMYCIN HCL IN DEXTROSE 1-5 GM/200ML-% IV SOLN
1000.0000 mg | Freq: Once | INTRAVENOUS | Status: AC
  Administered 2011-12-18: 1000 mg via INTRAVENOUS
  Filled 2011-12-17: qty 200

## 2011-12-17 MED ORDER — VANCOMYCIN HCL IN DEXTROSE 1-5 GM/200ML-% IV SOLN
INTRAVENOUS | Status: AC
  Filled 2011-12-17: qty 200

## 2011-12-17 MED ORDER — POLYETHYLENE GLYCOL 3350 17 G PO PACK
17.0000 g | PACK | Freq: Every day | ORAL | Status: DC | PRN
  Filled 2011-12-17: qty 1

## 2011-12-17 MED ORDER — BUPIVACAINE HCL (PF) 0.5 % IJ SOLN
INTRAMUSCULAR | Status: DC | PRN
  Administered 2011-12-17: 10 mL
  Administered 2011-12-17: 7 mL

## 2011-12-17 SURGICAL SUPPLY — 101 items
ADH SKN CLS APL DERMABOND .7 (GAUZE/BANDAGES/DRESSINGS) ×2
APL SKNCLS STERI-STRIP NONHPOA (GAUZE/BANDAGES/DRESSINGS) ×2
BAG DECANTER FOR FLEXI CONT (MISCELLANEOUS) ×3 IMPLANT
BANDAGE GAUZE ELAST BULKY 4 IN (GAUZE/BANDAGES/DRESSINGS) ×2 IMPLANT
BENZOIN TINCTURE PRP APPL 2/3 (GAUZE/BANDAGES/DRESSINGS) ×1 IMPLANT
BIT DRILL 14MM (INSTRUMENTS) IMPLANT
BIT DRILL NEURO 2X3.1 SFT TUCH (MISCELLANEOUS) ×2 IMPLANT
BLADE SURG 15 STRL LF DISP TIS (BLADE) IMPLANT
BLADE SURG 15 STRL SS (BLADE) ×3
BLADE ULTRA TIP 2M (BLADE) IMPLANT
BONE VOID FILLER STRIP 10CC (Bone Implant) ×1 IMPLANT
BUR BARREL STRAIGHT FLUTE 4.0 (BURR) ×3 IMPLANT
BUR PRECISION FLUTE 5.0 (BURR) ×1 IMPLANT
CANISTER SUCTION 2500CC (MISCELLANEOUS) ×3 IMPLANT
CLOTH BEACON ORANGE TIMEOUT ST (SAFETY) ×6 IMPLANT
CONT SPEC 4OZ CLIKSEAL STRL BL (MISCELLANEOUS) ×3 IMPLANT
CORDS BIPOLAR (ELECTRODE) ×1 IMPLANT
COVER MAYO STAND STRL (DRAPES) ×3 IMPLANT
DERMABOND ADVANCED (GAUZE/BANDAGES/DRESSINGS) ×1
DERMABOND ADVANCED .7 DNX12 (GAUZE/BANDAGES/DRESSINGS) ×2 IMPLANT
DRAPE INCISE IOBAN 66X45 STRL (DRAPES) ×1 IMPLANT
DRAPE LAPAROTOMY 100X72 PEDS (DRAPES) ×6 IMPLANT
DRAPE MICROSCOPE LEICA (MISCELLANEOUS) ×3 IMPLANT
DRAPE POUCH INSTRU U-SHP 10X18 (DRAPES) ×8 IMPLANT
DRAPE PROXIMA HALF (DRAPES) IMPLANT
DRESSING TELFA 8X3 (GAUZE/BANDAGES/DRESSINGS) ×1 IMPLANT
DRILL 14MM (INSTRUMENTS) ×3
DRILL NEURO 2X3.1 SOFT TOUCH (MISCELLANEOUS) ×3
DRSG PAD ABDOMINAL 8X10 ST (GAUZE/BANDAGES/DRESSINGS) IMPLANT
DURAPREP 6ML APPLICATOR 50/CS (WOUND CARE) ×6 IMPLANT
ELECT BLADE 4.0 EZ CLEAN MEGAD (MISCELLANEOUS) ×3
ELECT COATED BLADE 2.86 ST (ELECTRODE) ×3 IMPLANT
ELECT REM PT RETURN 9FT ADLT (ELECTROSURGICAL) ×3
ELECTRODE BLDE 4.0 EZ CLN MEGD (MISCELLANEOUS) IMPLANT
ELECTRODE REM PT RTRN 9FT ADLT (ELECTROSURGICAL) ×2 IMPLANT
GAUZE SPONGE 4X4 16PLY XRAY LF (GAUZE/BANDAGES/DRESSINGS) ×1 IMPLANT
GLOVE BIO SURGEON STRL SZ 6.5 (GLOVE) ×2 IMPLANT
GLOVE BIO SURGEON STRL SZ8 (GLOVE) ×6 IMPLANT
GLOVE BIOGEL PI IND STRL 6.5 (GLOVE) IMPLANT
GLOVE BIOGEL PI IND STRL 8 (GLOVE) ×4 IMPLANT
GLOVE BIOGEL PI IND STRL 8.5 (GLOVE) ×4 IMPLANT
GLOVE BIOGEL PI INDICATOR 6.5 (GLOVE) ×1
GLOVE BIOGEL PI INDICATOR 8 (GLOVE) ×6
GLOVE BIOGEL PI INDICATOR 8.5 (GLOVE) ×4
GLOVE ECLIPSE 7.5 STRL STRAW (GLOVE) ×5 IMPLANT
GLOVE ECLIPSE 8.0 STRL XLNG CF (GLOVE) ×4 IMPLANT
GLOVE EXAM NITRILE LRG STRL (GLOVE) IMPLANT
GLOVE EXAM NITRILE MD LF STRL (GLOVE) ×1 IMPLANT
GLOVE EXAM NITRILE XL STR (GLOVE) IMPLANT
GLOVE EXAM NITRILE XS STR PU (GLOVE) IMPLANT
GOWN BRE IMP SLV AUR LG STRL (GOWN DISPOSABLE) ×2 IMPLANT
GOWN BRE IMP SLV AUR XL STRL (GOWN DISPOSABLE) ×2 IMPLANT
GOWN STRL REIN 2XL LVL4 (GOWN DISPOSABLE) ×2 IMPLANT
HEAD HALTER (SOFTGOODS) ×3 IMPLANT
HEMOSTAT SURGICEL 2X14 (HEMOSTASIS) IMPLANT
KIT BASIN OR (CUSTOM PROCEDURE TRAY) ×3 IMPLANT
KIT INFUSE X SMALL 1.4CC (Orthopedic Implant) ×1 IMPLANT
KIT ROOM TURNOVER OR (KITS) ×3 IMPLANT
MARKER SKIN DUAL TIP RULER LAB (MISCELLANEOUS) ×3 IMPLANT
NDL HYPO 18GX1.5 BLUNT FILL (NEEDLE) IMPLANT
NDL HYPO 25X1 1.5 SAFETY (NEEDLE) ×2 IMPLANT
NDL SPNL 22GX3.5 QUINCKE BK (NEEDLE) ×2 IMPLANT
NEEDLE HYPO 18GX1.5 BLUNT FILL (NEEDLE) IMPLANT
NEEDLE HYPO 25X1 1.5 SAFETY (NEEDLE) ×3 IMPLANT
NEEDLE SPNL 22GX3.5 QUINCKE BK (NEEDLE) ×3 IMPLANT
NS IRRIG 1000ML POUR BTL (IV SOLUTION) ×3 IMPLANT
PACK LAMINECTOMY NEURO (CUSTOM PROCEDURE TRAY) ×3 IMPLANT
PAD ARMBOARD 7.5X6 YLW CONV (MISCELLANEOUS) ×9 IMPLANT
PENCIL BUTTON HOLSTER BLD 10FT (ELECTRODE) ×1 IMPLANT
PIN DISTRACTION 14MM (PIN) ×6 IMPLANT
PIN MAYFIELD SKULL DISP (PIN) ×3 IMPLANT
PLATE 16MM (Plate) ×2 IMPLANT
RASP 3.0MM (RASP) ×1 IMPLANT
ROD 240MM (Rod) ×1 IMPLANT
RUBBERBAND STERILE (MISCELLANEOUS) ×6 IMPLANT
SCREW 14MM (Screw) ×18 IMPLANT
SCREW BN 14X4.5XST VA (Screw) IMPLANT
SCREW POLYAXIAL 12MM (Screw) ×8 IMPLANT
SCREW POLYAXIAL 3.5X10MM (Screw) ×2 IMPLANT
SCREW SET (Screw) ×10 IMPLANT
SLEEVE SURGEON STRL (DRAPES) ×1 IMPLANT
SPACER ASSEM CERV LORD 7M (Spacer) ×1 IMPLANT
SPACER ASSEM CERV LORD 8M (Spacer) ×1 IMPLANT
SPONGE GAUZE 4X4 12PLY (GAUZE/BANDAGES/DRESSINGS) ×1 IMPLANT
SPONGE INTESTINAL PEANUT (DISPOSABLE) ×4 IMPLANT
SPONGE SURGIFOAM ABS GEL SZ50 (HEMOSTASIS) ×2 IMPLANT
STAPLER SKIN PROX WIDE 3.9 (STAPLE) ×3 IMPLANT
STRIP CLOSURE SKIN 1/2X4 (GAUZE/BANDAGES/DRESSINGS) ×1 IMPLANT
SUT VIC AB 0 CT1 18XCR BRD8 (SUTURE) ×2 IMPLANT
SUT VIC AB 0 CT1 8-18 (SUTURE) ×3
SUT VIC AB 2-0 CP2 18 (SUTURE) ×3 IMPLANT
SUT VIC AB 3-0 SH 8-18 (SUTURE) ×7 IMPLANT
SYR 20ML ECCENTRIC (SYRINGE) ×3 IMPLANT
SYR 3ML LL SCALE MARK (SYRINGE) IMPLANT
TAPE CLOTH SURG 4X10 WHT LF (GAUZE/BANDAGES/DRESSINGS) ×1 IMPLANT
TOWEL OR 17X24 6PK STRL BLUE (TOWEL DISPOSABLE) ×3 IMPLANT
TOWEL OR 17X26 10 PK STRL BLUE (TOWEL DISPOSABLE) ×6 IMPLANT
TRAP SPECIMEN MUCOUS 40CC (MISCELLANEOUS) ×3 IMPLANT
TRAY FOLEY CATH 14FRSI W/METER (CATHETERS) ×1 IMPLANT
TUBE CONNECTING 12X1/4 (SUCTIONS) ×1 IMPLANT
WATER STERILE IRR 1000ML POUR (IV SOLUTION) ×3 IMPLANT

## 2011-12-17 NOTE — Progress Notes (Signed)
Awake, alert, conversant.  Sore in neck, as expected, but improved strength and numbness. Doing well.

## 2011-12-17 NOTE — Preoperative (Signed)
Beta Blockers   Reason not to administer Beta Blockers:Not Applicable 

## 2011-12-17 NOTE — Anesthesia Procedure Notes (Signed)
Procedure Name: Intubation Date/Time: 12/17/2011 2:45 PM Performed by: Luciana Axe K Pre-anesthesia Checklist: Patient identified, Emergency Drugs available, Suction available, Patient being monitored and Timeout performed Patient Re-evaluated:Patient Re-evaluated prior to inductionOxygen Delivery Method: Circle system utilized Preoxygenation: Pre-oxygenation with 100% oxygen Intubation Type: IV induction Ventilation: Mask ventilation without difficulty and Oral airway inserted - appropriate to patient size Grade View: Grade I Tube type: Oral Tube size: 7.5 mm Number of attempts: 1 Airway Equipment and Method: Stylet,  Video-laryngoscopy and LTA kit utilized Placement Confirmation: ETT inserted through vocal cords under direct vision,  positive ETCO2 and breath sounds checked- equal and bilateral Secured at: 21 cm Tube secured with: Tape Dental Injury: Teeth and Oropharynx as per pre-operative assessment  Difficulty Due To: Difficulty was anticipated, Difficult Airway-  due to neck instability, Difficult Airway- due to limited oral opening and Difficult Airway- due to reduced neck mobility Comments: Neck maintained in neutral position during induction, masking, and video-glidescope intubation. Atraumatic. +ETCO2 bbse.

## 2011-12-17 NOTE — Anesthesia Postprocedure Evaluation (Signed)
  Anesthesia Post-op Note  Patient: Barry Lopez  Procedure(s) Performed: Procedure(s) (LRB) with comments: ANTERIOR CERVICAL DECOMPRESSION/DISCECTOMY FUSION 1 LEVEL (N/A) - Exploration of Fusion and Anterior Cervical Six-Seven Decompression/Diskectomy/Fusion POSTERIOR CERVICAL FUSION/FORAMINOTOMY LEVEL 4 () - Posterior Cervical Three-Seven Fusion  Patient Location: PACU  Anesthesia Type:General  Level of Consciousness: awake, oriented, sedated and patient cooperative  Airway and Oxygen Therapy: Patient Spontanous Breathing  Post-op Pain: mild  Post-op Assessment: Post-op Vital signs reviewed, Patient's Cardiovascular Status Stable, Respiratory Function Stable, Patent Airway, No signs of Nausea or vomiting and Pain level controlled  Post-op Vital Signs: stable  Complications: No apparent anesthesia complications

## 2011-12-17 NOTE — OR Nursing (Signed)
Procedure 1 end at 1630. Procedure 2 start at 1705.

## 2011-12-17 NOTE — H&P (Signed)
Cottie Banda Willets  [id number] DOB:  [birth year]  12/13/2011:  Rebbeca Paul comes back today to review his CT scan of his cervical spine. This shows that he has had a fusion at C4-5 and C5-6 levels, but no fusion at C3 and there appears to be some lucency around the screw, suggestive of incomplete fusion.    Based on these images, I have recommended going ahead with anterior decompression and fusion at C6-7 with possible plating vs. cutting the plate above C6, and then proceeding at the same setting with a posterior fusion C3 through 7 levels.   Mr. Scacco says that he is having trouble with having and maintaining erections and this has been a problem for him ever since his previous surgery.  He feels that his hands have gotten worse and does want to go ahead with the surgery.  I explained that we may keep a breathing tube in overnight. He will be in the ICU postoperatively and make sure that he is not having any swelling before removing the breathing tube.  He wished to proceed with surgery and this has been scheduled for 12/17/2011.             Marchia Meiers. Vertell Limber, M.D./aft   Cottie Banda. Go  [id number] DOB: 1955/10/03 12/07/2011:  Mr. Armstead returns to review his MRI of his cervical spine. Based on this he has a large disc herniation at C6-7 which is causing significant cord compression. I have recommended that he undergo anterior cervical decompression and fusion at the C6-7 level.   I am going to get a CT scan of his cervical spine to make sure that he has had successful arthrodesis at the previously operated levels as he does have a broken screw at C3.  If he does not, then we will likely do posterior decompression and fusion as well as anterior surgery. He wants to come back with his wife to review these findings and imaging studies, and I will see him back on the 4th of November to discuss that.             Marchia Meiers. 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 He notes pain into both of his arms and his right leg, and he says he has poor sleep and no position of comfort.  He says this has gone on since June of 2013.  He notes numbness in his hands, his arms and his legs.   REVIEW OF SYSTEMS:   A detailed Review of Systems sheet was reviewed with the patient.  Pertinent positives include under musculoskeletal - for arm weakness, leg weakness, back pain, arm pain, leg pain, and neck pain.   All other systems are negative; this includes Constitutional symptoms, Eyes, Cardiovascular, Ears, nose, mouth, throat, Endocrine, Respiratory, Gastrointestinal, Genitourinary, Musculoskeletal, Integumentary & Breast, Neurologic, Psychiatric, Hematologic/Lymphatic, Allergic/Immunologic.    PAST MEDICAL HISTORY:      Current Medical Conditions:    He has a history of noninsulin dependent diabetes mellitus.      Prior Operations and Hospitalizations:   He has previously undergone C3 through C6 fusion by me in April of 2009 with corpectomies of C4 and C5.  He has not had any significant neck pain following that surgery and had  relief of his significant cervical myelopathy.      Medications and Allergies:  Current medications are Metformin one twice daily, Januvia one once daily, Simvastatin once a day, Lisinopril two a day, and Vitamin D once a week.  He is ALLERGIC TO PENICILLIN WHICH CAUSES HIVES.      Height and Weight:     He is currently 5'8" tall, 180 lbs.  BMI is 27.5.    FAMILY HISTORY:    Not applicable.    SOCIAL HISTORY:    He denies tobacco, alcohol, or drug use.   DIAGNOSTIC STUDIES:   The plain radiographs obtained in the office today show well positioned corpectomy graft with a single fractured screw at C3 without abnormal motion at this level.    PHYSICAL EXAMINATION:  Mr. Plattner has had  an MRI of his cervical spine which appears to show a large central disc herniation at C6-7 with significant cord compression at this level.      General Appearance:   On examination today, Mr. Bozard is a pleasant and cooperative man in no acute distress.      Blood Pressure, Pulse:     His blood pressure is 130/94.  Heart rate is 76 and regular.  Respiratory rate is 16.      HEENT - normocephalic, atraumatic.  The pupils are equal, round and reactive to light.  The extraocular muscles are intact.  Sclerae - white.  Conjunctiva - pink.  Oropharynx benign.  Uvula midline.     Neck - there are no masses, meningismus, deformities, tracheal deviation, jugular vein distention or carotid bruits.  There is normal cervical range of motion.  He has right-sided neck pain with positive Lhermitte's sign with axial compression and bilaterally positive Spurlings' maneuvers.      Respiratory - there is normal respiratory effort with good intercostal function.  Lungs are clear to auscultation.  There are no rales, rhonchi or wheezes.      Cardiovascular - the heart has regular rate and rhythm to auscultation.  No murmurs are appreciated.  There is no extremity edema, cyanosis or clubbing.  There are palpable pedal pulses.      Abdomen - soft, nontender, no hepatosplenomegaly appreciated or masses.  There are active bowel sounds.  No guarding or rebound.      Musculoskeletal Examination - the patient is able to walk about the examining room with a normal, casual, heel and toe gait.  Negative Tinel's and Phalen's signs at the wrists.    NEUROLOGICAL EXAMINATION: The patient is oriented to time, person and place and has good recall of both recent and remote memory with normal attention span and concentration.  The patient speaks with clear and fluent speech and exhibits normal language function and appropriate fund of knowledge.   Cranial Nerve Examination - pupils are equal, round and reactive to light.   Extraocular movements are full.  Visual fields are full to confrontational testing.  Facial sensation and facial movement are symmetric and intact.  Hearing is intact to finger rub.  Palate is upgoing.  Shoulder shrug is symmetric.  Tongue protrudes in the midline.      Motor Examination - motor strength is 5/5 in the bilateral deltoids, biceps, triceps, handgrips, wrist extensors, interosseous, with the exception of bilateral hand intrinsic weakness at 4-/5, with bilateral finger extensor weakness at 4-/5.  He has full strength in all other motor groups in the upper and lower extremities.  In the lower extremities motor strength is  5/5 in hip flexion, extension, quadriceps, hamstrings, plantar flexion, dorsiflexion and extensor hallucis longus.      Sensory Examination - he has decreased pin sensation in both upper extremities greater than lower extremities and right side more effected than left.      Deep Tendon Reflexes - deep tendon reflexes are brisk at 3 in the biceps and triceps, negative Hoffmann's signs, 3 in the knees, 2 in the ankles.  The great toes are downgoing to plantar stimulation.    IMPRESSION AND RECOMMENDATIONS: Based on my review of Mr. Stallworth MRI, radiographs, and his examination, I believe that he needs to undergo decompression of the cervical spinal cord with anterior cervical decompression and fusion at the C6-7 level. The inferior aspect of the plate is quite low over the C6 vertebral body, which would make it not impossible to abut a new plate against the old plate, and we would have to most likely cut the plate and remove the inferior screws and then to perform an interbody fusion at the C6-7 level. I am concerned about the fractured screw at C3 and while he has not had any significant neck pain, I would like to make sure that there is no evidence of a pseudoarthrosis or incomplete fusion and to this end I have recommended we get a CT scan of the cervical spine. I have  discussed his case with Dr. Sherwood Gambler who reviewed his films and also met with the patient, and Dr. Donnella Bi recommendation, which I believe is sound, is to go ahead with anterior exposure, cut the inferior aspect of the plate to perform a discectomy at C6-7 and a fusion at that level, but that based on the CT scan, if there appears to be a pseudoarthrosis, then to turn the patient prone and perform a posterior instrumented fusion C3 through C7 levels.  The patient is very uncomfortable and I am concerned about the severity of his myelopathy and he wishes to go ahead with surgery, and we will plan on doing this on 12/17/2011, and we will modify the specific surgical plan after obtaining the cervical CT scan.  Risks and benefits are discussed with the patient who wishes to proceed.                                  [facility]    Marchia Meiers. Vertell Limber, M.D.

## 2011-12-17 NOTE — Transfer of Care (Signed)
Immediate Anesthesia Transfer of Care Note  Patient: Barry Lopez  Procedure(s) Performed: Procedure(s) (LRB) with comments: ANTERIOR CERVICAL DECOMPRESSION/DISCECTOMY FUSION 1 LEVEL (N/A) - Exploration of Fusion and Anterior Cervical Six-Seven Decompression/Diskectomy/Fusion POSTERIOR CERVICAL FUSION/FORAMINOTOMY LEVEL 4 () - Posterior Cervical Three-Seven Fusion  Patient Location: PACU  Anesthesia Type:General  Level of Consciousness: awake, alert , oriented and patient cooperative  Airway & Oxygen Therapy: Patient Spontanous Breathing and Patient connected to face mask oxygen  Post-op Assessment: Report given to PACU RN, Post -op Vital signs reviewed and stable and Patient moving all extremities  Post vital signs: Reviewed and stable  Complications: No apparent anesthesia complications

## 2011-12-17 NOTE — Anesthesia Preprocedure Evaluation (Addendum)
Anesthesia Evaluation  Patient identified by MRN, date of birth, ID band Patient awake    Reviewed: Allergy & Precautions, H&P , NPO status , Patient's Chart, lab work & pertinent test results  History of Anesthesia Complications Negative for: history of anesthetic complications  Airway Mallampati: II TM Distance: >3 FB Neck ROM: Limited  Mouth opening: Limited Mouth Opening  Dental  (+) Teeth Intact, Edentulous Upper and Dental Advisory Given   Pulmonary neg pulmonary ROS,  breath sounds clear to auscultation        Cardiovascular hypertension, Pt. on medications Rhythm:Regular Rate:Normal     Neuro/Psych    GI/Hepatic   Endo/Other  diabetes, Type 2, Oral Hypoglycemic Agents  Renal/GU      Musculoskeletal  (+) Arthritis -,   Abdominal   Peds  Hematology Possibility of Sickle Cell Trait. Patient's daughter has Sickle Cell disease.   Anesthesia Other Findings   Reproductive/Obstetrics                          Anesthesia Physical Anesthesia Plan  ASA: II  Anesthesia Plan: General   Post-op Pain Management:    Induction: Intravenous  Airway Management Planned: Oral ETT  Additional Equipment:   Intra-op Plan:   Post-operative Plan: Extubation in OR  Informed Consent: I have reviewed the patients History and Physical, chart, labs and discussed the procedure including the risks, benefits and alternatives for the proposed anesthesia with the patient or authorized representative who has indicated his/her understanding and acceptance.     Plan Discussed with: CRNA and Surgeon  Anesthesia Plan Comments:         Anesthesia Quick Evaluation

## 2011-12-17 NOTE — Op Note (Signed)
12/17/2011  6:56 PM  PATIENT:  Barry Lopez  56 y.o. 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, Cervical stenosis, Pseudoarthrosis prior corpectomy C 3-6  POST-OPERATIVE DIAGNOSIS:  Cervical spondylosis with myelopathy C 6/7 , Cervical hnp with myelopathy C 6/7, Cervical stenosis, Pseudoarthrosis prior corpectomy C 3-6  PROCEDURE:  Procedure(s) (LRB) with comments: ANTERIOR CERVICAL DECOMPRESSION/DISCECTOMY FUSION 1 LEVEL (N/A) - Exploration of Fusion and Anterior Cervical Six-Seven Decompression/Diskectomy/Fusion POSTERIOR CERVICAL FUSION/FORAMINOTOMY LEVEL 4 () - Posterior Cervical Three-Seven Fusion  SURGEON:  Surgeon(s) and Role: Panel 1:    * Erline Levine, MD - Primary    * Faythe Ghee, MD - Assisting  Panel 2:    * Erline Levine, MD - Primary  PHYSICIAN ASSISTANT:   ASSISTANTS: Poteat, RN   ANESTHESIA:   general  EBL:  Total I/O In: 1500 [I.V.:1500] Out: 325 [Urine:225; Blood:100]  BLOOD ADMINISTERED:none  DRAINS: none   LOCAL MEDICATIONS USED:  LIDOCAINE   SPECIMEN:  No Specimen  DISPOSITION OF SPECIMEN:  N/A  COUNTS:  YES  TOURNIQUET:  * No tourniquets in log *  DICTATION: Patient is 56 year old man with large central disc herniation at C 6/7 with severe cord compression and cord edema, who had previously undergone anterior cervical corpectomy of C 4 and C 5 who has a broken screw at C 3 and incomplete arthrodesis of previous corpectomy graft on CT scan.  It was elected to take the patient to surgery for anterior cervical decompression and fusion at C 6/7, cutting of the anterior cervical plate and then posterior fusion C 3-7 levels.  PROCEDURE: Patient was brought to operating room and following the smooth and uncomplicated induction of general endotracheal anesthesia his head was placed on a horseshoe head holder he was placed in 5 pounds of Holter traction and his anterior neck was  prepped and draped in usual sterile fashion. An incision was made on the left side of midline after infiltrating the skin and subcutaneous tissues with local lidocaine. The platysmal layer was incised and subplatysmal dissection was performed exposing the anterior border sternocleidomastoid muscle. Using blunt dissection the carotid sheath was kept lateral and trachea and esophagus kept medial exposing the anterior cervical spine. The inferior aspect of the previously placed plate was identified and then painstakingly cleared of investing soft tissues and bony overgrowth. Longus coli muscles were taken down from the anterior cervical spine using electrocautery and key elevator and self-retaining retractor was placed. The interspace at C 6/7 was incised and a thorough discectomy was performed. The inferior aspect of the plate was cut with a metal cutting bur and the portion of the plate at C 6 was removed. Distraction pins were placed. Uncinate spurs and central spondylitic ridges were drilled down with a high-speed drill. The spinal cord dura and both C7 nerve roots were widely decompressed. There was a large amount of disc material which was removed which was causing severe compression of the cervical spinal cord.  Ball hooks were used to palpate behind the C 7 vertebral body and confirm that all compressive disc material had been removed.Hemostasis was assured. After trial sizing an 8 mm bone allograft wedge was selected after initially placing a 7 mm graft, which was felt to be too loose.  The graft was packed with autograft. Tamped into position and countersunk appropriately. Distraction weight was removed. A 16 mm trestle luxe anterior cervical plate was affixed to the cervical  spine with 14 mm variable-angle screws 2 at C6 and 2 at C7. All screws were well-positioned and locking mechanisms were engaged. The prior C 6 screw holes were used with 4.5 mm screws. Soft tissues were inspected and found to be in good  repair. The wound was irrigated. The platysma layer was closed with 3-0 Vicryl stitches and the skin was reapproximated with 3-0 Vicryl subcuticular stitches. The wound was dressed with Dermabond. Counts were correct at the end of the case. Patient was returned to the OR gurney, placed in 3 pin head fixation and placed in a prone position on chest rolls. Shoulders were taped to facilitate Xray visualization. Posterior neck was prepped and draped in the usual fashion.  Area of planned incision was infiltrated with local lidocaine.  Incision was made from C3-C7 and carried through the avascular midline plane to expose these lavels and their lateral masses.  Lateral mass screws were placed from C3 to C7 bilaterally according to standard landmarks and their positioning was confirmed with fluoroscopy.  The facet joints and laminae were decorticated and packed with extra small BMP and NexOss bone graft extender.  Screws and rods were locked down in situ.  Wound was closed with 0, 2-0, and 3-0 vicryl sutures.  Sterile occlusive dressing was placed.  Patient was taken out of pins and turned back onto the OR table. Counts were correct at the end of the case. Patient was extubated and taken to recovery in stable and satisfactory condition.  PLAN OF CARE: Admit to inpatient   PATIENT DISPOSITION:  PACU - hemodynamically stable.   Delay start of Pharmacological VTE agent (>24hrs) due to surgical blood loss or risk of bleeding: yes

## 2011-12-17 NOTE — Brief Op Note (Signed)
12/17/2011  6:56 PM  PATIENT:  Barry Lopez  56 y.o. 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, Cervical stenosis, Pseudoarthrosis prior corpectomy C 3-6  POST-OPERATIVE DIAGNOSIS:  Cervical spondylosis with myelopathy C 6/7 , Cervical hnp with myelopathy C 6/7, Cervical stenosis, Pseudoarthrosis prior corpectomy C 3-6  PROCEDURE:  Procedure(s) (LRB) with comments: ANTERIOR CERVICAL DECOMPRESSION/DISCECTOMY FUSION 1 LEVEL (N/A) - Exploration of Fusion and Anterior Cervical Six-Seven Decompression/Diskectomy/Fusion POSTERIOR CERVICAL FUSION/FORAMINOTOMY LEVEL 4 () - Posterior Cervical Three-Seven Fusion  SURGEON:  Surgeon(s) and Role: Panel 1:    * Erline Levine, MD - Primary    * Faythe Ghee, MD - Assisting  Panel 2:    * Erline Levine, MD - Primary  PHYSICIAN ASSISTANT:   ASSISTANTS: Poteat, RN   ANESTHESIA:   general  EBL:  Total I/O In: 1500 [I.V.:1500] Out: 325 [Urine:225; Blood:100]  BLOOD ADMINISTERED:none  DRAINS: none   LOCAL MEDICATIONS USED:  LIDOCAINE   SPECIMEN:  No Specimen  DISPOSITION OF SPECIMEN:  N/A  COUNTS:  YES  TOURNIQUET:  * No tourniquets in log *  DICTATION: Patient is 56 year old man with large central disc herniation at C 6/7 with severe cord compression and cord edema, who had previously undergone anterior cervical corpectomy of C 4 and C 5 who has a broken screw at C 3 and incomplete arthrodesis of previous corpectomy graft on CT scan.  It was elected to take the patient to surgery for anterior cervical decompression and fusion at C 6/7, cutting of the anterior cervical plate and then posterior fusion C 3-7 levels.  PROCEDURE: Patient was brought to operating room and following the smooth and uncomplicated induction of general endotracheal anesthesia his head was placed on a horseshoe head holder he was placed in 5 pounds of Holter traction and his anterior neck was  prepped and draped in usual sterile fashion. An incision was made on the left side of midline after infiltrating the skin and subcutaneous tissues with local lidocaine. The platysmal layer was incised and subplatysmal dissection was performed exposing the anterior border sternocleidomastoid muscle. Using blunt dissection the carotid sheath was kept lateral and trachea and esophagus kept medial exposing the anterior cervical spine. The inferior aspect of the previously placed plate was identified and then painstakingly cleared of investing soft tissues and bony overgrowth. Longus coli muscles were taken down from the anterior cervical spine using electrocautery and key elevator and self-retaining retractor was placed. The interspace at C 6/7 was incised and a thorough discectomy was performed. The inferior aspect of the plate was cut with a metal cutting bur and the portion of the plate at C 6 was removed. Distraction pins were placed. Uncinate spurs and central spondylitic ridges were drilled down with a high-speed drill. The spinal cord dura and both C7 nerve roots were widely decompressed. There was a large amount of disc material which was removed which was causing severe compression of the cervical spinal cord.  Ball hooks were used to palpate behind the C 7 vertebral body and confirm that all compressive disc material had been removed.Hemostasis was assured. After trial sizing an 8 mm bone allograft wedge was selected after initially placing a 7 mm graft, which was felt to be too loose.  The graft was packed with autograft. Tamped into position and countersunk appropriately. Distraction weight was removed. A 16 mm trestle luxe anterior cervical plate was affixed to the cervical  spine with 14 mm variable-angle screws 2 at C6 and 2 at C7. All screws were well-positioned and locking mechanisms were engaged. The prior C 6 screw holes were used with 4.5 mm screws. Soft tissues were inspected and found to be in good  repair. The wound was irrigated. The platysma layer was closed with 3-0 Vicryl stitches and the skin was reapproximated with 3-0 Vicryl subcuticular stitches. The wound was dressed with Dermabond. Counts were correct at the end of the case. Patient was returned to the OR gurney, placed in 3 pin head fixation and placed in a prone position on chest rolls. Shoulders were taped to facilitate Xray visualization. Posterior neck was prepped and draped in the usual fashion.  Area of planned incision was infiltrated with local lidocaine.  Incision was made from C3-C7 and carried through the avascular midline plane to expose these lavels and their lateral masses.  Lateral mass screws were placed from C3 to C7 bilaterally according to standard landmarks and their positioning was confirmed with fluoroscopy.  The facet joints and laminae were decorticated and packed with extra small BMP and NexOss bone graft extender.  Screws and rods were locked down in situ.  Wound was closed with 0, 2-0, and 3-0 vicryl sutures.  Sterile occlusive dressing was placed.  Patient was taken out of pins and turned back onto the OR table. Counts were correct at the end of the case. Patient was extubated and taken to recovery in stable and satisfactory condition.  PLAN OF CARE: Admit to inpatient   PATIENT DISPOSITION:  PACU - hemodynamically stable.   Delay start of Pharmacological VTE agent (>24hrs) due to surgical blood loss or risk of bleeding: yes

## 2011-12-18 LAB — GLUCOSE, CAPILLARY
Glucose-Capillary: 228 mg/dL — ABNORMAL HIGH (ref 70–99)
Glucose-Capillary: 232 mg/dL — ABNORMAL HIGH (ref 70–99)
Glucose-Capillary: 221 mg/dL — ABNORMAL HIGH (ref 70–99)
Glucose-Capillary: 167 mg/dL — ABNORMAL HIGH (ref 70–99)

## 2011-12-18 LAB — HEMOGLOBIN A1C
Hgb A1c MFr Bld: 6.7 % — ABNORMAL HIGH (ref <5.7)
Mean Plasma Glucose: 146 mg/dL — ABNORMAL HIGH (ref <117)

## 2011-12-18 MED ORDER — PANTOPRAZOLE SODIUM 40 MG PO TBEC
40.0000 mg | DELAYED_RELEASE_TABLET | Freq: Every day | ORAL | Status: DC
  Administered 2011-12-18 – 2011-12-20 (×3): 40 mg via ORAL
  Filled 2011-12-18 (×3): qty 1

## 2011-12-18 NOTE — Progress Notes (Signed)
Patient ID: Barry Lopez, male   DOB: 01/30/1956, 56 y.o.   MRN: [id number] Subjective: Patient reports neck soreness  Objective: Vital signs in last 24 hours: Temp:  [97.8 F (36.6 C)-98.8 F (37.1 C)] 98.8 F (37.1 C) (11/09 0736) Pulse Rate:  [77-99] 94  (11/09 0700) Resp:  [11-29] 18  (11/09 0700) BP: (107-170)/(76-106) 161/91 mmHg (11/09 0700) SpO2:  [95 %-100 %] 99 % (11/09 0700) Weight:  [84.1 kg (185 lb 6.5 oz)] 84.1 kg (185 lb 6.5 oz) (11/08 2048)  Intake/Output from previous day: 11/08 0701 - 11/09 0700 In: 2932 [P.O.:480; I.V.:2250; IV Piggyback:202] Out: 1515 [Urine:1415; Blood:100] Intake/Output this shift: Total I/O In: 75 [I.V.:75] Out: 400 [Urine:400]  Wound:clean and dry. Neuro stable  Lab Results: No results found for this basename: WBC:2,HGB:2,HCT:2,PLT:2 in the last 72 hours BMET No results found for this basename: NA:2,K:2,CL:2,CO2:2,GLUCOSE:2,BUN:2,CREATININE:2,CALCIUM:2 in the last 72 hours  Studies/Results: Dg Cervical Spine 1 View  12/17/2011  *RADIOLOGY REPORT*  Clinical Data: C3 - C7 posterior cervical fusion  DG C-ARM 61-120 MIN,DG CERVICAL SPINE - 1 VIEW  Comparison:  Intraoperative cervical spine radiograph - earlier same day; cervical spine radiographs - 12/01/2011; cervical spine CT - 12/07/2011  Findings:  A single lateral intraoperative radiograph of the cervical spine is provided for review.  The C1 to the superior endplate of C6 is visualized. C7 is obscured secondary to overlying osseous and soft tissue structures.  Post C4 - C5 corpectomy.  There has been interval resection of the inferior aspect of the previously identified C3 - C6 ACDF plate.  The cranial aspect of an additional cervical plate is seen at C6.  Posterior elements screws are seen at C5-C6, though suspected at C7.  IMPRESSION: Post multilevel posterior spinal fusion as above.   Original Report Authenticated By: Jake Seats, MD    Dg Cervical Spine 1 View  12/17/2011   *RADIOLOGY REPORT*  Clinical Data: ACDF - 1 level  DG CERVICAL SPINE - 1 VIEW  Comparison: Cervical spine radiographs - 12/01/2011; cervical spine CT - 12/07/2011  Findings:  A single lateral intraoperative radiograph of the cervical spine is provided for review.  C1 to the superior endplate of C6 is visualized.  Evaluation of C6 and C7 is degraded secondary to overlying osseous and soft tissue structures.  Post C4 and C5 corpectomy.  There has been apparent removal of the inferior aspect of the previously identified C3 - C6 ACDF plate at the level of C5. The cranial aspect of an additional ACDF plate is seen at C6.  Enteric tube and endotracheal tube tips are excluded from view.  A radiopaque drain overlies the inferior aspect of the operative site.  IMPRESSION: Degraded intraoperative cervical spine radiograph as above.   Original Report Authenticated By: Jake Seats, MD    Dg C-arm [phone] Min  12/17/2011  *RADIOLOGY REPORT*  Clinical Data: C3 - C7 posterior cervical fusion  DG C-ARM 61-120 MIN,DG CERVICAL SPINE - 1 VIEW  Comparison:  Intraoperative cervical spine radiograph - earlier same day; cervical spine radiographs - 12/01/2011; cervical spine CT - 12/07/2011  Findings:  A single lateral intraoperative radiograph of the cervical spine is provided for review.  The C1 to the superior endplate of C6 is visualized. C7 is obscured secondary to overlying osseous and soft tissue structures.  Post C4 - C5 corpectomy.  There has been interval resection of the inferior aspect of the previously identified C3 - C6 ACDF plate.  The cranial aspect of  an additional cervical plate is seen at C6.  Posterior elements screws are seen at C5-C6, though suspected at C7.  IMPRESSION: Post multilevel posterior spinal fusion as above.   Original Report Authenticated By: Jake Seats, MD     Assessment/Plan: Doing well POD 1. Will increase activity, plan transfer to floor tomorrow.  LOS: 1 day  as above   Faythe Ghee, MD 12/18/2011, 9:03 AM

## 2011-12-18 NOTE — Evaluation (Signed)
Physical Therapy Evaluation Patient Details Name: Barry Lopez MRN: [id number] DOB: May 08, 1955 Today's Date: 12/18/2011 Time: [id number] PT Time Calculation (min): 29 min  PT Assessment / Plan / Recommendation Clinical Impression  Pt s/p ACDF C6-7 and PCF C3-7. Pt has good LE strength although still limited by pain during movement. Pt will benefit from skilled PT in the acute care setting in order to maximize functional mobility, strength and safety prior to d/c    PT Assessment  Patient needs continued PT services    Follow Up Recommendations  No PT follow up;Supervision for mobility/OOB    Does the patient have the potential to tolerate intense rehabilitation      Barriers to Discharge        Equipment Recommendations  Rolling walker with 5" wheels    Recommendations for Other Services     Frequency Min 4X/week    Precautions / Restrictions Precautions Precautions: Cervical Precaution Comments: pt educated on cervical precautions Required Braces or Orthoses: Cervical Brace Cervical Brace: Hard collar;Applied in supine position Restrictions Weight Bearing Restrictions: No   Pertinent Vitals/Pain Neck pain 6/10. RN aware, pain meds given prior to treatment.         Mobility  Bed Mobility Bed Mobility: Not assessed Transfers Transfers: Sit to Stand;Stand to Sit Sit to Stand: 4: Min guard;With upper extremity assist;From chair/3-in-1 Stand to Sit: 4: Min guard;With upper extremity assist;To chair/3-in-1 Details for Transfer Assistance: VC for safe hand placement to/from RW. Cues to maintain neutral neck position during transfer Ambulation/Gait Ambulation/Gait Assistance: 4: Min guard Ambulation Distance (Feet): 150 Feet Assistive device: Rolling walker Ambulation/Gait Assistance Details: Short steps during ambulation around unit. Pt relying heavily on RW although needing only minguard assist from therapist. Cues for upright posture and safety with RW Gait Pattern:  Step-to pattern;Decreased stride length;Decreased hip/knee flexion - right;Decreased hip/knee flexion - left;Trunk flexed Gait velocity: decreased gait speed Stairs: No    Shoulder Instructions     Exercises     PT Diagnosis: Acute pain;Difficulty walking  PT Problem List: Decreased activity tolerance;Decreased mobility;Decreased knowledge of use of DME;Decreased safety awareness;Decreased knowledge of precautions;Pain PT Treatment Interventions: DME instruction;Gait training;Stair training;Functional mobility training;Patient/family education   PT Goals Acute Rehab PT Goals PT Goal Formulation: With patient Time For Goal Achievement: 12/25/11 Potential to Achieve Goals: Fair Pt will go Supine/Side to Sit: with modified independence PT Goal: Supine/Side to Sit - Progress: Goal set today Pt will go Sit to Supine/Side: with modified independence PT Goal: Sit to Supine/Side - Progress: Goal set today Pt will go Sit to Stand: with modified independence PT Goal: Sit to Stand - Progress: Goal set today Pt will go Stand to Sit: with modified independence PT Goal: Stand to Sit - Progress: Goal set today Pt will Transfer Bed to Chair/Chair to Bed: with modified independence PT Transfer Goal: Bed to Chair/Chair to Bed - Progress: Goal set today Pt will Ambulate: >150 feet;with modified independence;with least restrictive assistive device PT Goal: Ambulate - Progress: Goal set today Pt will Go Up / Down Stairs: 1-2 stairs;with supervision PT Goal: Up/Down Stairs - Progress: Goal set today  Visit Information  Last PT Received On: 12/18/11 Assistance Needed: +1    Subjective Data  Patient Stated Goal: to go home   Prior Functioning  Home Living Lives With: Spouse Available Help at Discharge: Family;Other (Comment) (wife works during the day) Type of Home: House Home Access: Stairs to enter [employer] of Steps: 1 Entrance Stairs-Rails: None Home Layout:  One  level Bathroom Shower/Tub: Tub/shower unit;Door [employer] Toilet: Standard Bathroom Accessibility: Yes How Accessible: Accessible via walker Home Adaptive Equipment: None Prior Function Level of Independence: Needs assistance Needs Assistance: Bathing;Dressing Bath: Moderate Dressing: Moderate Able to Take Stairs?: Yes Driving: Yes Vocation: Full time employment Comments: buckhandler Communication Communication: No difficulties Dominant Hand: Right    Cognition  Overall Cognitive Status: Appears within functional limits for tasks assessed/performed Arousal/Alertness: Awake/alert Orientation Level: Appears intact for tasks assessed Behavior During Session: [facility] for tasks performed    Extremity/Trunk Assessment Right Upper Extremity Assessment RUE ROM/Strength/Tone: Deficits RUE ROM/Strength/Tone Deficits: pain with movement of RUE. limited abduction and forward shoulder flexion Right Lower Extremity Assessment RLE ROM/Strength/Tone: Within functional levels RLE Sensation: WFL - Light Touch Left Lower Extremity Assessment LLE ROM/Strength/Tone: Within functional levels LLE Sensation: WFL - Light Touch   Balance    End of Session PT - End of Session Equipment Utilized During Treatment: Gait belt;Cervical collar Activity Tolerance: Patient tolerated treatment well Patient left: in chair;with call bell/phone within reach Nurse Communication: Mobility status  GP     Ambrose Finland 12/18/2011, 9:25 AM

## 2011-12-19 LAB — GLUCOSE, CAPILLARY
Glucose-Capillary: 228 mg/dL — ABNORMAL HIGH (ref 70–99)
Glucose-Capillary: 210 mg/dL — ABNORMAL HIGH (ref 70–99)
Glucose-Capillary: 126 mg/dL — ABNORMAL HIGH (ref 70–99)
Glucose-Capillary: 204 mg/dL — ABNORMAL HIGH (ref 70–99)

## 2011-12-19 NOTE — Progress Notes (Signed)
Visited pt while rounding in 3100. Pt was sitting in chair wearing neck brace, recovering from neck surgery. Pt was in a lot of pain. Pt has had similar surgery in the past and is hoping this is the last such surgery needed. Had prayer regarding pt's pain and for recovery. Pt expressed appreciation for chaplain visit.

## 2011-12-19 NOTE — Progress Notes (Signed)
Patient ID: Barry Lopez, male   DOB: December 24, 1955, 56 y.o.   MRN: [id number] Doing well. Both wounds look excellent. Ambulating fine. Does complain of neck soreness. Will transfer to floor today.

## 2011-12-19 NOTE — Progress Notes (Signed)
Physical Therapy Treatment Patient Details Name: Barry Lopez MRN: [id number] DOB: 28-Apr-1955 Today's Date: 12/19/2011 Time: [id number] PT Time Calculation (min): 11 min  PT Assessment / Plan / Recommendation Comments on Treatment Session  Pt progressing well. Pt required max encouragement to participate in therapy secondary to pain. Stiff posture throughout. Pt educated on the importance of mobiity to decrease stiffness and weakness from deconditioning. Continue per plan, attempt stairs tomorrow.    Follow Up Recommendations  No PT follow up;Supervision for mobility/OOB     Does the patient have the potential to tolerate intense rehabilitation     Barriers to Discharge        Equipment Recommendations  Rolling walker with 5" wheels    Recommendations for Other Services    Frequency Min 4X/week   Plan Discharge plan remains appropriate;Frequency remains appropriate    Precautions / Restrictions Precautions Precautions: Cervical Precaution Comments: pt able to verbalize and maintain cervical precautions during treatment Required Braces or Orthoses: Cervical Brace Cervical Brace: Hard collar;Applied in supine position Restrictions Weight Bearing Restrictions: No   Pertinent Vitals/Pain Pain 6/10 with mobility.    Mobility  Bed Mobility Bed Mobility: Sit to Sidelying Left Sit to Sidelying Left: 4: Min assist Details for Bed Mobility Assistance: Min assist with LEs into bed. Cueing for safe technique during transfer into bed Transfers Transfers: Sit to Stand;Stand to Sit Sit to Stand: 4: Min guard;With upper extremity assist;From chair/3-in-1 Stand to Sit: 4: Min guard;With upper extremity assist;To bed Details for Transfer Assistance: VC for safe hand placement during transfer. Pt able to maintain cervical precautions throughout Ambulation/Gait Ambulation/Gait Assistance: 4: Min guard Ambulation Distance (Feet): 150 Feet Assistive device: Rolling walker Ambulation/Gait  Assistance Details: Pt with increased stride this session. Minguard throughout ambulation for safety. Pt with stiff neck and back throughout.  Gait Pattern: Step-to pattern;Decreased stride length;Decreased hip/knee flexion - right;Decreased hip/knee flexion - left;Trunk flexed Gait velocity: decreased gait speed Stairs: No (pt refused this session)    Exercises Other Exercises Other Exercises: Instructed pt to not raise his arms above shoulder height, can use his bottle of cleanser as a weight to do bicep curls, foreram supination/pronation, wrist flexion extension; and that I would make sure that whomever follows up with him tomorrow will go over theraputty exercises with him. Also informed him that he should not do any kind of resistive exercises with arms at this point   PT Diagnosis:    PT Problem List:   PT Treatment Interventions:     PT Goals Acute Rehab PT Goals PT Goal: Supine/Side to Sit - Progress: Progressing toward goal PT Goal: Sit to Supine/Side - Progress: Progressing toward goal PT Goal: Sit to Stand - Progress: Progressing toward goal PT Goal: Stand to Sit - Progress: Progressing toward goal PT Transfer Goal: Bed to Chair/Chair to Bed - Progress: Progressing toward goal PT Goal: Ambulate - Progress: Progressing toward goal  Visit Information  Last PT Received On: 12/19/11 Assistance Needed: +1    Subjective Data      Cognition  Overall Cognitive Status: Appears within functional limits for tasks assessed/performed Arousal/Alertness: Awake/alert Orientation Level: Appears intact for tasks assessed Behavior During Session: [facility] for tasks performed    Balance     End of Session PT - End of Session Equipment Utilized During Treatment: Gait belt;Cervical collar Activity Tolerance: Patient tolerated treatment well Patient left: in bed;with call bell/phone within reach;with family/visitor present Nurse Communication: Mobility status   GP     Barry Lopez,  Cherylann Banas 12/19/2011, 1:55 PM

## 2011-12-19 NOTE — Evaluation (Addendum)
Occupational Therapy Evaluation Patient Details Name: Barry Lopez MRN: [id number] DOB: 1955/08/22 Today's Date: 12/19/2011 Time: 0921-0939 OT Time Calculation (min): 18 min  OT Assessment / Plan / Recommendation Clinical Impression  This 56 yo male s/p anterior and posterior cervical fusion presents to acute OT with Bil UE issues (L>R) and cervical precautions, thus affecting pt's Independence. Will benefit from acute OT with follow up OPOT. Will follow.    OT Assessment  Patient needs continued OT Services    Follow Up Recommendations  Outpatient OT    Barriers to Discharge None (per pt, although he will be alone during the day)    Equipment Recommendations  Rolling walker with 5" wheels (OT=TBD)       Frequency  Min 3X/week    Precautions / Restrictions Precautions Precautions: Cervical Required Braces or Orthoses: Cervical Brace Cervical Brace: Hard collar;Applied in supine position Restrictions Weight Bearing Restrictions: No   Pertinent Vitals/Pain 7/10 headache    ADL  Eating/Feeding: Simulated;Set up;Supervision/safety Where Assessed - Eating/Feeding: Chair Grooming: Simulated;Set up;Supervision/safety Where Assessed - Grooming: Supported sitting Upper Body Bathing: Simulated;Supervision/safety;Set up Where Assessed - Upper Body Bathing: Supported sitting Lower Body Bathing: Simulated;Maximal assistance Where Assessed - Lower Body Bathing: Supported sitting Upper Body Dressing: Simulated;+1 Total assistance Where Assessed - Upper Body Dressing: Supported sitting Lower Body Dressing: Simulated;+1 Total assistance (pt cannot sit upright and cross one leg over the other) Where Assessed - Lower Body Dressing: Unsupported sitting Equipment Used:  (cervical collar) Transfers/Ambulation Related to ADLs: Pt, not wanting to get up right now. RN reports pt is a min A to a min guard A level for sit to stand and ambulation ADL Comments: All activities take increased  time due to decreased Bil UE strength overall ("they feel heavy")    OT Diagnosis: Generalized weakness  OT Problem List: Decreased strength;Decreased range of motion;Impaired balance (sitting and/or standing);Decreased coordination;Decreased knowledge of use of DME or AE;Decreased knowledge of precautions;Impaired UE functional use OT Treatment Interventions: Self-care/ADL training;DME and/or AE instruction;Therapeutic exercise;Patient/family education;Balance training   OT Goals Acute Rehab OT Goals OT Goal Formulation: With patient Time For Goal Achievement: 12/26/11 Potential to Achieve Goals: Good ADL Goals Pt Will Perform Grooming: with set-up;with supervision;Unsupported;Standing at sink (1 task) ADL Goal: Grooming - Progress: Goal set today Pt Will Transfer to Toilet: with modified independence;Ambulation;with DME;3-in-1;Grab bars ADL Goal: Toilet Transfer - Progress: Goal set today Pt Will Perform Tub/Shower Transfer: Tub transfer;with supervision;Ambulation;with DME;Shower seat with back ADL Goal: Clinical cytogeneticist - Progress: Goal set today Miscellaneous OT Goals Miscellaneous OT Goal #1: Pt will be supervision with Bil UE exercises (AROM shoulders to 90 flexion; elbow-forearm-wrist with 1-2 lbs of weight; hand with theraputty) OT Goal: Miscellaneous Goal #1 - Progress: Goal set today Miscellaneous OT Goal #2: Wife will be independent with changing out pads on collar and re-donning collar. OT Goal: Miscellaneous Goal #2 - Progress: Goal set today  Visit Information  Last OT Received On: 12/19/11 Assistance Needed: +1    Subjective Data  Subjective: I just got comfortable and really do not want to get back up now (from recliner) Patient Stated Goal: Did not ask   Prior Functioning     Home Living Lives With: Spouse Available Help at Discharge: Family (wife works during the day, neighbor can check on him) Type of Home: House Home Access: Stairs to enter [employer], site of Steps: 1 Entrance Stairs-Rails: None Home Layout: One level Bathroom Shower/Tub: Tub/shower unit;Door Bathroom Toilet: Standard Bathroom Accessibility: Yes  How Accessible: Accessible via walker Home Adaptive Equipment: None Prior Function Level of Independence: Independent (per pt, contradictory to what told to PT yesterday) Able to Take Stairs?: Yes Driving: Yes Vocation: Full time employment Comments: Has to life alot Communication Communication: No difficulties Dominant Hand: Right            Cognition  Overall Cognitive Status: Appears within functional limits for tasks assessed/performed Arousal/Alertness: Awake/alert Orientation Level: Appears intact for tasks assessed Behavior During Session: [facility] for tasks performed    Extremity/Trunk Assessment Right Upper Extremity Assessment RUE ROM/Strength/Tone: Deficits RUE ROM/Strength/Tone Deficits: Only had pt raise shoulder to 90 degrees flexion (2-/5 with abduction component), rest 3/5. No c/o pain today Left Upper Extremity Assessment LUE ROM/Strength/Tone: Deficits LUE ROM/Strength/Tone Deficits: Only had pt raise shoulder to 90 degrees flexion (2+/5 with abduction component), rest 3/5. No c/o pain today    Instructed pt to not raise his arms above shoulder height, can use his bottle of cleanser as a weight to do bicep curls, foreram supination/pronation, wrist flexion extension; and that I would make sure that whomever follows up with him tomorrow will go over theraputty exercises with him. Also informed him that he should not do any kind of resistive exercises with arms at this point    Mobility Bed Mobility Bed Mobility: Not assessed Transfers Transfers: Not assessed              End of Session OT - End of Session Equipment Utilized During Treatment: Cervical collar Activity Tolerance: Patient limited by fatigue Patient left: in chair;with call bell/phone within reach Nurse Communication:   (That the patient wanted to see the nurse)       Almon Register [id number] 12/19/2011, 10:50 AM

## 2011-12-20 ENCOUNTER — Encounter (HOSPITAL_COMMUNITY): Payer: Self-pay | Admitting: Neurosurgery

## 2011-12-20 LAB — GLUCOSE, CAPILLARY: Glucose-Capillary: 173 mg/dL — ABNORMAL HIGH (ref 70–99)

## 2011-12-20 MED ORDER — DIAZEPAM 5 MG PO TABS: 5.0000 mg | ORAL_TABLET | Freq: Four times a day (QID) | ORAL | Status: DC | PRN

## 2011-12-20 MED ORDER — OXYCODONE-ACETAMINOPHEN 5-325 MG PO TABS: 1.0000 | ORAL_TABLET | ORAL | Status: DC | PRN

## 2011-12-20 NOTE — Progress Notes (Signed)
Occupational Therapy Treatment Patient Details Name: Barry Lopez MRN: [id number] DOB: 04/08/1955 Today's Date: 12/20/2011 Time: [id number] OT Time Calculation (min): 40 min  OT Assessment / Plan / Recommendation Comments on Treatment Session This 56 yo male making progress. Main focus on Bil UE exercises today due to pt going home. Wife aware of how to A him from prior neck surgery in 2009.     Follow Up Recommendations  Outpatient OT             Frequency Min 3X/week   Plan Discharge plan remains appropriate    Precautions / Restrictions Precautions Precautions: Cervical Precaution Comments: pt able to verbalize and maintain cervical precautions during treatment Required Braces or Orthoses: Cervical Brace Cervical Brace: Hard collar;Applied in sitting position [employer] over with wife how to change out pads and clean them) Restrictions Weight Bearing Restrictions: No         OT Goals ADL Goals ADL Goal: Tub/Shower Transfer - Progress:  (Will sponge bathe until post MD follow up visit) Miscellaneous OT Goals Miscellaneous OT Goal #1: Pt will be supervision with Bil UE exeercises ( AROM shoulders to 90 flexion; elbow-forearm-wrist with 1-2 lbs of weigh, hand with theraputty) OT Goal: Miscellaneous Goal #1 - Progress: Met Miscellaneous OT Goal #2: Wife will be independent with changing out pads on collar and re-donning collar OT Goal: Miscellaneous Goal #2 - Progress: Met  Visit Information  Last OT Received On: 12/20/11 Assistance Needed: +1          Cognition  Overall Cognitive Status: Appears within functional limits for tasks assessed/performed Arousal/Alertness: Awake/alert Orientation Level: Appears intact for tasks assessed Behavior During Session: [facility] for tasks performed    Mobility  Shoulder Instructions Transfers Sit to Stand: 5: Supervision;With armrests;From chair/3-in-1 Stand to Sit: 5: Supervision;With armrests;To chair/3-in-1 Details for Transfer  Assistance: increased time       Exercises  Other Exercises Other Exercises: Pt given handout on Bil UE shoulder (AAROM), elbow-wrist-hand (1-2# weight)with arm supported on surface (hard copy in shadow chart) 5-10 reps 5x/day. Also given hand out on FM activities (2x/day), theraputty exercise sheets x2 (2x/day). Made him and his wife aware that I had recommended OPOT and that if he is still having issues with his arms at his follow up with Dr. Vertell Limber to let him know and then perhaps then he would order OPPT. Pt does report "numbness" in his left hand digits 5,4, and 1/2 of 3).      End of Session OT - End of Session Equipment Utilized During Treatment: Cervical collar Patient left: in chair;with family/visitor present       Almon Register [id number] 12/20/2011, 10:24 AM

## 2011-12-20 NOTE — Care Management Note (Signed)
    Page 1 of 1   12/20/2011     12:25:21 PM   CARE MANAGEMENT NOTE 12/20/2011  Patient:  Barry Lopez, Barry Lopez   Account Number:  [id number]  Date Initiated:  12/20/2011  Documentation initiated by:  [facility]  Subjective/Objective Assessment:   Admitted postop anterior decompression and fusion C6/7, posterior instrumented fusion C3-7.     Action/Plan:   PT/OT evals- recommended outpatient OT and rolling walker   Anticipated DC Date:  12/20/2011   Anticipated DC Plan:  [place]  CM consult  OP Neuro Rehab      Choice offered to / List presented to:     DME arranged  Vassie Moselle      DME agency  [place].        Status of service:  Completed, signed off Medicare Important Message given?   (If response is "NO", the following Medicare IM given date fields will be blank) Date Medicare IM given:   Date Additional Medicare IM given:    Discharge Disposition:  HOME/SELF CARE  Per UR Regulation:  Reviewed for med. necessity/level of care/duration of stay  If discussed at [place] of Stay Meetings, dates discussed:    Comments:  12/20/11 OT recomending outpatient OT.Spoke with patient about outpatient OT, he is agreeable to going neuro rehab for outpatient OT. Gave patient map and telephone #. Faxed order, referral, d/c summary, and OT eval to neuro rehab [phone]. Neuro rehab will call patient to make appt. Rolling walker delivered to patient prior to discharge. Fuller Plan RN, BSN, CCM

## 2011-12-20 NOTE — Progress Notes (Signed)
Physical Therapy Treatment Patient Details Name: Barry Lopez MRN: [id number] DOB: 08-04-1955 Today's Date: 12/20/2011 Time: [id number] PT Time Calculation (min): 20 min  PT Assessment / Plan / Recommendation Comments on Treatment Session  Pt with improved ambulation tolerance. Pt safe to d/c home with spouse and recommended DME.    Follow Up Recommendations  No PT follow up;Supervision for mobility/OOB     Does the patient have the potential to tolerate intense rehabilitation     Barriers to Discharge        Equipment Recommendations       Recommendations for Other Services    Frequency Min 4X/week   Plan Discharge plan remains appropriate;Frequency remains appropriate    Precautions / Restrictions Precautions Precautions: Cervical Precaution Comments: pt able to verbalize and maintain cervical precautions during treatment Required Braces or Orthoses: Cervical Brace Cervical Brace: Hard collar Restrictions Weight Bearing Restrictions: No   Pertinent Vitals/Pain 5/10 neck pain       Mobility  Transfers Transfers: Sit to Stand;Stand to Sit Sit to Stand: 5: Supervision;With armrests;From chair/3-in-1 Stand to Sit: 5: Supervision;With armrests;To chair/3-in-1 Details for Transfer Assistance: increased time Ambulation/Gait Ambulation/Gait Assistance: 4: Min guard (with no AD, supervision with RW) Ambulation Distance (Feet):  (150'x2) Assistive device: Rolling walker Ambulation/Gait Assistance Details: pt safe to ambulate with RW mod I but remains to require supervision when ambulating without AD Gait Pattern: Step-to pattern;Decreased stride length;Decreased hip/knee flexion - right;Decreased hip/knee flexion - left;Trunk flexed General Gait Details: pt reports decreased cervical/shoulder pain with RW Stairs:  (discussed verbally how to asc/desc 1 step with RW)    Exercises     PT Diagnosis:    PT Problem List:   PT Treatment Interventions:     PT Goals Acute  Rehab PT Goals PT Goal: Ambulate - Progress: Progressing toward goal PT Goal: Up/Down Stairs - Progress: Progressing toward goal Additional Goals Additional Goal #1: Pt I and compliant with cervical precautions PT Goal: Additional Goal #1 - Progress: Met  Visit Information  Last PT Received On: 12/20/11 Assistance Needed: +1    Subjective Data  Subjective: Pt received up in chair with report of 5/10 neck pain and report of d/c today.   Cognition  Overall Cognitive Status: Appears within functional limits for tasks assessed/performed Arousal/Alertness: Awake/alert Orientation Level: Appears intact for tasks assessed Behavior During Session: [facility] for tasks performed    Balance     End of Session PT - End of Session Equipment Utilized During Treatment: Gait belt;Cervical collar Activity Tolerance: Patient tolerated treatment well Patient left: in chair;with call bell/phone within reach   GP     [name] 12/20/2011, 9:31 AM  [name], PT, DPT Pager #: [phone] Office #: [phone]

## 2011-12-20 NOTE — Progress Notes (Signed)
Subjective: Patient reports doing well  Objective: Vital signs in last 24 hours: Temp:  [98.7 F (37.1 C)-99.4 F (37.4 C)] 99.1 F (37.3 C) (11/11 0612) Pulse Rate:  [102-121] 102  (11/11 0612) Resp:  [16-21] 18  (11/11 0612) BP: (155-175)/(66-89) 174/84 mmHg (11/11 0612) SpO2:  [94 %-98 %] 98 % (11/11 0612)  Intake/Output from previous day: 11/10 0701 - 11/11 0700 In: -  Out: 500 [Urine:500] Intake/Output this shift:    Physical Exam: Full strength bilateral upper extremities without numbness  Lab Results: No results found for this basename: WBC:2,HGB:2,HCT:2,PLT:2 in the last 72 hours BMET No results found for this basename: NA:2,K:2,CL:2,CO2:2,GLUCOSE:2,BUN:2,CREATININE:2,CALCIUM:2 in the last 72 hours  Studies/Results: No results found.  Assessment/Plan: D/C home.  Doing well.  F/U in office 2-3 weeks    LOS: 3 days    Peggyann Shoals, MD 12/20/2011, 8:27 AM

## 2011-12-20 NOTE — Clinical Social Work Note (Addendum)
Clinical Social Work  CSW received consult for SNF. CSW reviewed chart and discussed pt with RN during progression. PT is recommending home health. Pt is ready for discharge to home. RNCM is aware and following. CSW is signing off as no further needs identified.   Darden Dates, MSW, Latanya Presser [phone]  Addendum: Correction-PT is recommending no follow up.

## 2011-12-20 NOTE — Discharge Summary (Signed)
Physician Discharge Summary  Patient ID: Barry Lopez MRN: [id number] DOB/AGE: 03-13-1955 56 y.o.  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 *    Discharged Condition: good  Hospital Course: Anterior decompression and fusion C6/7 with posterior instrumented fusion C3-7.  Patient did well with improvement in myelopathy  Consults: None  Significant Diagnostic Studies: None  Treatments: surgery: Anterior decompression and fusion C6/7 with posterior instrumented fusion C3-7  Discharge Exam: Blood pressure 174/84, pulse 102, temperature 99.1 F (37.3 C), temperature source Oral, resp. rate 18, height 5\' 8"  (1.727 m), weight 80.5 kg (177 lb 7.5 oz), SpO2 98.00%. Neurologic: Alert and oriented X 3, normal strength and tone. Normal symmetric reflexes. Normal coordination and gait Wound:CDI  Disposition: Home     Medication List     As of 12/20/2011  8:24 AM    TAKE these medications         diazepam 5 MG tablet   Commonly known as: VALIUM   Take 1 tablet (5 mg total) by mouth every 6 (six) hours as needed.      lisinopril 20 MG tablet   Commonly known as: PRINIVIL,ZESTRIL   Take 20 mg by mouth 2 (two) times daily.      metFORMIN 500 MG tablet   Commonly known as: GLUCOPHAGE   Take 500 mg by mouth 2 (two) times daily with a meal.      oxyCODONE-acetaminophen 5-325 MG per tablet   Commonly known as: PERCOCET/ROXICET   Take 1-2 tablets by mouth every 4 (four) hours as needed.      simvastatin 20 MG tablet   Commonly known as: ZOCOR   Take 20 mg by mouth daily.      sitaGLIPtin 100 MG tablet   Commonly known as: JANUVIA   Take 100 mg by mouth daily.      Vitamin D (Ergocalciferol) 50000 UNITS Caps   Commonly known as: DRISDOL   Take 50,000 Units by mouth every 7 (seven) days. Sundays          Signed: Peggyann Shoals, MD 12/20/2011, 8:24 AM

## 2012-01-12 ENCOUNTER — Ambulatory Visit: Payer: BC Managed Care – PPO | Admitting: Occupational Therapy

## 2012-01-19 ENCOUNTER — Ambulatory Visit: Payer: BC Managed Care – PPO | Attending: Internal Medicine | Admitting: Occupational Therapy

## 2012-01-19 DIAGNOSIS — M6281 Muscle weakness (generalized): Secondary | ICD-10-CM | POA: Insufficient documentation

## 2012-01-19 DIAGNOSIS — M2550 Pain in unspecified joint: Secondary | ICD-10-CM | POA: Insufficient documentation

## 2012-01-19 DIAGNOSIS — R5381 Other malaise: Secondary | ICD-10-CM | POA: Insufficient documentation

## 2012-01-19 DIAGNOSIS — IMO0001 Reserved for inherently not codable concepts without codable children: Secondary | ICD-10-CM | POA: Insufficient documentation

## 2012-01-25 ENCOUNTER — Ambulatory Visit: Payer: BC Managed Care – PPO | Admitting: Occupational Therapy

## 2012-01-27 ENCOUNTER — Ambulatory Visit: Payer: BC Managed Care – PPO | Admitting: *Deleted

## 2012-01-31 ENCOUNTER — Ambulatory Visit: Payer: BC Managed Care – PPO | Admitting: *Deleted

## 2012-02-04 ENCOUNTER — Ambulatory Visit: Payer: BC Managed Care – PPO | Admitting: Occupational Therapy

## 2012-02-08 ENCOUNTER — Ambulatory Visit: Payer: BC Managed Care – PPO | Admitting: Occupational Therapy

## 2012-02-10 ENCOUNTER — Ambulatory Visit: Payer: BC Managed Care – PPO | Attending: Internal Medicine | Admitting: Occupational Therapy

## 2012-02-10 DIAGNOSIS — IMO0001 Reserved for inherently not codable concepts without codable children: Secondary | ICD-10-CM | POA: Insufficient documentation

## 2012-02-10 DIAGNOSIS — M2550 Pain in unspecified joint: Secondary | ICD-10-CM | POA: Insufficient documentation

## 2012-02-10 DIAGNOSIS — M6281 Muscle weakness (generalized): Secondary | ICD-10-CM | POA: Insufficient documentation

## 2012-02-10 DIAGNOSIS — R5381 Other malaise: Secondary | ICD-10-CM | POA: Insufficient documentation

## 2012-02-15 ENCOUNTER — Ambulatory Visit: Payer: BC Managed Care – PPO | Admitting: Occupational Therapy

## 2012-02-17 ENCOUNTER — Ambulatory Visit: Payer: BC Managed Care – PPO | Admitting: Occupational Therapy

## 2012-02-22 ENCOUNTER — Ambulatory Visit: Payer: BC Managed Care – PPO | Admitting: Occupational Therapy

## 2012-02-24 ENCOUNTER — Ambulatory Visit: Payer: BC Managed Care – PPO | Admitting: Occupational Therapy

## 2012-02-29 ENCOUNTER — Encounter: Payer: BC Managed Care – PPO | Admitting: Occupational Therapy

## 2012-03-02 ENCOUNTER — Encounter: Payer: BC Managed Care – PPO | Admitting: Occupational Therapy

## 2012-03-07 ENCOUNTER — Ambulatory Visit: Payer: BC Managed Care – PPO | Admitting: Occupational Therapy

## 2012-03-09 ENCOUNTER — Ambulatory Visit: Payer: BC Managed Care – PPO | Admitting: Occupational Therapy

## 2012-03-14 ENCOUNTER — Encounter: Payer: BC Managed Care – PPO | Admitting: Occupational Therapy

## 2012-03-16 ENCOUNTER — Encounter: Payer: BC Managed Care – PPO | Admitting: Occupational Therapy

## 2012-03-27 ENCOUNTER — Emergency Department (HOSPITAL_COMMUNITY): Payer: BC Managed Care – PPO

## 2012-03-27 ENCOUNTER — Encounter (HOSPITAL_COMMUNITY): Payer: Self-pay | Admitting: *Deleted

## 2012-03-27 ENCOUNTER — Inpatient Hospital Stay (HOSPITAL_COMMUNITY)
Admission: EM | Admit: 2012-03-27 | Discharge: 2012-03-29 | DRG: 204 | Disposition: A | Discharge status: Home / Self Care | Payer: BC Managed Care – PPO | Attending: Internal Medicine | Admitting: Internal Medicine

## 2012-03-27 ENCOUNTER — Other Ambulatory Visit: Payer: Self-pay

## 2012-03-27 DIAGNOSIS — Z9089 Acquired absence of other organs: Secondary | ICD-10-CM

## 2012-03-27 DIAGNOSIS — Z880 Allergy status to penicillin: Secondary | ICD-10-CM

## 2012-03-27 DIAGNOSIS — R509 Fever, unspecified: Secondary | ICD-10-CM | POA: Diagnosis present

## 2012-03-27 DIAGNOSIS — K859 Acute pancreatitis without necrosis or infection, unspecified: Principal | ICD-10-CM | POA: Diagnosis present

## 2012-03-27 DIAGNOSIS — M5030 Other cervical disc degeneration, unspecified cervical region: Secondary | ICD-10-CM | POA: Diagnosis present

## 2012-03-27 DIAGNOSIS — E781 Pure hyperglyceridemia: Secondary | ICD-10-CM | POA: Diagnosis present

## 2012-03-27 DIAGNOSIS — Z981 Arthrodesis status: Secondary | ICD-10-CM

## 2012-03-27 DIAGNOSIS — E785 Hyperlipidemia, unspecified: Secondary | ICD-10-CM | POA: Diagnosis present

## 2012-03-27 DIAGNOSIS — Z79899 Other long term (current) drug therapy: Secondary | ICD-10-CM

## 2012-03-27 DIAGNOSIS — I10 Essential (primary) hypertension: Secondary | ICD-10-CM | POA: Diagnosis present

## 2012-03-27 DIAGNOSIS — E119 Type 2 diabetes mellitus without complications: Secondary | ICD-10-CM

## 2012-03-27 LAB — GLUCOSE, CAPILLARY
Glucose-Capillary: 141 mg/dL — ABNORMAL HIGH (ref 70–99)
Glucose-Capillary: 102 mg/dL — ABNORMAL HIGH (ref 70–99)
Glucose-Capillary: 94 mg/dL (ref 70–99)

## 2012-03-27 LAB — COMPREHENSIVE METABOLIC PANEL
ALT: 51 U/L (ref 0–53)
AST: 57 U/L — ABNORMAL HIGH (ref 0–37)
Albumin: 2.9 g/dL — ABNORMAL LOW (ref 3.5–5.2)
Alkaline Phosphatase: 77 U/L (ref 39–117)
BUN: 20 mg/dL (ref 6–23)
CO2: 22 mEq/L (ref 19–32)
Calcium: 8.2 mg/dL — ABNORMAL LOW (ref 8.4–10.5)
Chloride: 102 mEq/L (ref 96–112)
Creatinine, Ser: 1.13 mg/dL (ref 0.50–1.35)
GFR calc Af Amer: 82 mL/min — ABNORMAL LOW (ref >90)
GFR calc non Af Amer: 71 mL/min — ABNORMAL LOW (ref >90)
Glucose, Bld: 154 mg/dL — ABNORMAL HIGH (ref 70–99)
Potassium: 4.1 mEq/L (ref 3.5–5.1)
Sodium: 135 mEq/L (ref 135–145)
Total Bilirubin: 0.4 mg/dL (ref 0.3–1.2)
Total Protein: 7.1 g/dL (ref 6.0–8.3)

## 2012-03-27 LAB — URINALYSIS, MICROSCOPIC ONLY
Bilirubin Urine: NEGATIVE
Glucose, UA: NEGATIVE mg/dL
Ketones, ur: NEGATIVE mg/dL
Leukocytes, UA: NEGATIVE
Nitrite: NEGATIVE
Protein, ur: >300 mg/dL — AB
Specific Gravity, Urine: 1.017 (ref 1.005–1.030)
Urobilinogen, UA: 0.2 mg/dL (ref 0.0–1.0)
pH: 5.5 (ref 5.0–8.0)

## 2012-03-27 LAB — CBC WITH DIFFERENTIAL/PLATELET
Basophils Absolute: 0 10*3/uL (ref 0.0–0.1)
Basophils Relative: 0 % (ref 0–1)
Eosinophils Absolute: 0.4 10*3/uL (ref 0.0–0.7)
Eosinophils Relative: 6 % — ABNORMAL HIGH (ref 0–5)
HCT: 39.7 % (ref 39.0–52.0)
Hemoglobin: 14.6 g/dL (ref 13.0–17.0)
Lymphocytes Relative: 24 % (ref 12–46)
Lymphs Abs: 1.6 10*3/uL (ref 0.7–4.0)
MCH: 27.7 pg (ref 26.0–34.0)
MCHC: 36.8 g/dL — ABNORMAL HIGH (ref 30.0–36.0)
MCV: 75.3 fL — ABNORMAL LOW (ref 78.0–100.0)
Monocytes Absolute: 0.9 10*3/uL (ref 0.1–1.0)
Monocytes Relative: 13 % — ABNORMAL HIGH (ref 3–12)
Neutro Abs: 3.8 10*3/uL (ref 1.7–7.7)
Neutrophils Relative %: 56 % (ref 43–77)
Platelets: 174 10*3/uL (ref 150–400)
RBC: 5.27 MIL/uL (ref 4.22–5.81)
RDW: 12.5 % (ref 11.5–15.5)
WBC: 6.7 10*3/uL (ref 4.0–10.5)

## 2012-03-27 LAB — LIPASE, BLOOD
Lipase: 1981 U/L — ABNORMAL HIGH (ref 11–59)
Lipase: 644 U/L — ABNORMAL HIGH (ref 11–59)

## 2012-03-27 LAB — LIPID PANEL
Cholesterol: 154 mg/dL (ref 0–200)
HDL: 54 mg/dL (ref >39)
LDL Cholesterol: 66 mg/dL (ref 0–99)
Total CHOL/HDL Ratio: 2.9 RATIO
Triglycerides: 171 mg/dL — ABNORMAL HIGH (ref <150)
VLDL: 34 mg/dL (ref 0–40)

## 2012-03-27 LAB — TROPONIN I
Troponin I: <0.3 ng/mL (ref <0.30)
Troponin I: <0.3 ng/mL (ref <0.30)

## 2012-03-27 MED ORDER — HYDROMORPHONE HCL PF 1 MG/ML IJ SOLN: 1.0000 mg | INTRAMUSCULAR | Status: AC | PRN

## 2012-03-27 MED ORDER — ONDANSETRON HCL 4 MG/2ML IJ SOLN: 4.0000 mg | Freq: Four times a day (QID) | INTRAMUSCULAR | Status: DC | PRN

## 2012-03-27 MED ORDER — DIAZEPAM 5 MG PO TABS: 5.0000 mg | ORAL_TABLET | Freq: Four times a day (QID) | ORAL | Status: DC | PRN

## 2012-03-27 MED ORDER — IOHEXOL 300 MG/ML  SOLN
50.0000 mL | Freq: Once | INTRAMUSCULAR | Status: AC | PRN
  Administered 2012-03-27: 50 mL via ORAL

## 2012-03-27 MED ORDER — ACETAMINOPHEN 325 MG PO TABS
650.0000 mg | ORAL_TABLET | Freq: Four times a day (QID) | ORAL | Status: DC | PRN
  Administered 2012-03-27 – 2012-03-29 (×3): 650 mg via ORAL
  Filled 2012-03-27 (×3): qty 2

## 2012-03-27 MED ORDER — SODIUM CHLORIDE 0.9 % IV SOLN
INTRAVENOUS | Status: DC
  Administered 2012-03-27: 04:00:00 via INTRAVENOUS

## 2012-03-27 MED ORDER — PROMETHAZINE HCL 25 MG/ML IJ SOLN
25.0000 mg | Freq: Once | INTRAMUSCULAR | Status: AC
  Administered 2012-03-27: 25 mg via INTRAVENOUS
  Filled 2012-03-27: qty 1

## 2012-03-27 MED ORDER — HYDRALAZINE HCL 20 MG/ML IJ SOLN
10.0000 mg | INTRAMUSCULAR | Status: DC | PRN
  Administered 2012-03-27 – 2012-03-29 (×2): 10 mg via INTRAVENOUS
  Filled 2012-03-27 (×2): qty 1

## 2012-03-27 MED ORDER — IOHEXOL 300 MG/ML  SOLN
100.0000 mL | Freq: Once | INTRAMUSCULAR | Status: AC | PRN
  Administered 2012-03-27: 100 mL via INTRAVENOUS

## 2012-03-27 MED ORDER — ACETAMINOPHEN 650 MG RE SUPP: 650.0000 mg | Freq: Four times a day (QID) | RECTAL | Status: DC | PRN

## 2012-03-27 MED ORDER — ONDANSETRON HCL 4 MG/2ML IJ SOLN: 4.0000 mg | Freq: Three times a day (TID) | INTRAMUSCULAR | Status: AC | PRN

## 2012-03-27 MED ORDER — MORPHINE SULFATE 2 MG/ML IJ SOLN: 1.0000 mg | INTRAMUSCULAR | Status: DC | PRN

## 2012-03-27 MED ORDER — INSULIN ASPART 100 UNIT/ML ~~LOC~~ SOLN: 0.0000 [IU] | Freq: Three times a day (TID) | SUBCUTANEOUS | Status: DC

## 2012-03-27 MED ORDER — SODIUM CHLORIDE 0.9 % IJ SOLN
3.0000 mL | Freq: Two times a day (BID) | INTRAMUSCULAR | Status: DC
  Administered 2012-03-29: 3 mL via INTRAVENOUS

## 2012-03-27 MED ORDER — ONDANSETRON HCL 4 MG PO TABS: 4.0000 mg | ORAL_TABLET | Freq: Four times a day (QID) | ORAL | Status: DC | PRN

## 2012-03-27 MED ORDER — HYDROMORPHONE HCL PF 1 MG/ML IJ SOLN
1.0000 mg | Freq: Once | INTRAMUSCULAR | Status: AC
  Administered 2012-03-27: 1 mg via INTRAVENOUS
  Filled 2012-03-27: qty 1

## 2012-03-27 MED ORDER — SODIUM CHLORIDE 0.9 % IV SOLN
INTRAVENOUS | Status: DC
  Administered 2012-03-27 – 2012-03-29 (×6): via INTRAVENOUS

## 2012-03-27 MED ORDER — ONDANSETRON HCL 4 MG/2ML IJ SOLN
4.0000 mg | Freq: Once | INTRAMUSCULAR | Status: AC
  Administered 2012-03-27: 4 mg via INTRAVENOUS
  Filled 2012-03-27: qty 2

## 2012-03-27 MED ORDER — MORPHINE SULFATE 4 MG/ML IJ SOLN
6.0000 mg | Freq: Once | INTRAMUSCULAR | Status: AC
  Administered 2012-03-27: 6 mg via INTRAVENOUS
  Filled 2012-03-27: qty 2

## 2012-03-27 MED ORDER — SODIUM CHLORIDE 0.9 % IV SOLN: INTRAVENOUS | Status: AC

## 2012-03-27 NOTE — H&P (Signed)
[facility] History and Physical  Barry Lopez [id number] DOB: 06-30-1955 DOA: 03/27/2012  Referring physician: Dr. Venora Maples. PCP: Philis Fendt, MD  Specialists: None.  Chief Complaint: Abdominal pain.  HPI: Barry Lopez is a 57 y.o. male history of diabetes mellitus type 2, hypertension and hyperlipidemia started experiencing abdominal pain since yesterday. The pain is located mostly in the epigastric area radiating to the back sharp associated with nausea but no vomiting, denies any diarrhea fever chills. Since the pain is persistent patient came to the ER and labs reveal elevated lipase with CT confirming pancreatitis. Patient at this time as been admitted for further management. Patient's pain is controlled with morphine. Patient otherwise denies any chest pain shortness of breath.  Review of Systems: The patient denies anorexia, fever, weight loss,, vision loss, decreased hearing, hoarseness, chest pain, syncope, dyspnea on exertion, peripheral edema, balance deficits, hemoptysis, melena, hematochezia, severe indigestion/heartburn, hematuria, incontinence, genital sores, muscle weakness, suspicious skin lesions, transient blindness, difficulty walking, depression, unusual weight change, abnormal bleeding, enlarged lymph nodes, angioedema, and breast masses. Has abdominal pain with nausea.  Past Medical History  Diagnosis Date  . Diabetes mellitus   . Hypertension   . Hypercholesteremia   . Degenerative disc disease   . Degenerative cervical disc    Past Surgical History  Procedure Laterality Date  . Bowel resection    . Neck surgery    . Tonsillectomy    . Hernia repair      YEARS AGO  . Cholecystectomy    . Anterior cervical decomp/discectomy fusion  12/17/2011    Procedure: ANTERIOR CERVICAL DECOMPRESSION/DISCECTOMY FUSION 1 LEVEL;  Surgeon: Erline Levine, MD;  Location: [place] [facility];  Service: Neurosurgery;  Laterality: N/A;  Exploration of Fusion and Anterior  Cervical Six-Seven Decompression/Diskectomy/Fusion  . Posterior cervical fusion/foraminotomy  12/17/2011    Procedure: POSTERIOR CERVICAL FUSION/FORAMINOTOMY LEVEL 4;  Surgeon: Erline Levine, MD;  Location: [facility];  Service: Neurosurgery;;  Posterior Cervical Three-Seven Fusion   Social History:  reports that he has never smoked. He does not have any smokeless tobacco history on file. He reports that he does not drink alcohol or use illicit drugs. Lives at home. where does patient live--home, ALF, SNF? and with whom if at home? Can do ADLs. Can patient participate in ADLs?  Allergies  Allergen Reactions  . Penicillins Hives and Itching    Family History  Problem Relation Age of Onset  . Heart failure Mother   . Diabetes Mellitus II Mother   . Diabetes Mellitus II Sister      Prior to Admission medications   Medication Sig Start Date End Date Taking? Authorizing Provider  hydrOXYzine (VISTARIL) 25 MG capsule Take 25 mg by mouth 3 (three) times daily as needed.   Yes Historical Provider, MD  lisinopril (PRINIVIL,ZESTRIL) 20 MG tablet Take 20 mg by mouth daily.    Yes Historical Provider, MD  metFORMIN (GLUCOPHAGE) 500 MG tablet Take 500 mg by mouth 2 (two) times daily with a meal.   Yes Historical Provider, MD  simvastatin (ZOCOR) 20 MG tablet Take 20 mg by mouth daily.   Yes Historical Provider, MD  sitaGLIPtin (JANUVIA) 100 MG tablet Take 100 mg by mouth daily.   Yes Historical Provider, MD  Vitamin D, Ergocalciferol, (DRISDOL) 50000 UNITS CAPS Take 50,000 Units by mouth every 7 (seven) days. Sundays   Yes Historical Provider, MD  diazepam (VALIUM) 5 MG tablet Take 1 tablet (5 mg total) by mouth every 6 (six) hours  as needed. 12/20/11   Erline Levine, MD   Physical Exam: Filed Vitals:   03/27/12 0241 03/27/12 0413  BP: 172/86 158/87  Pulse: 90 89  Temp: 99.2 F (37.3 C)   TempSrc: Oral   Resp: 22 19  Height: 5\' 8"  (1.727 m)   Weight: 84.278 kg (185 lb 12.8 oz)   SpO2: 98%  99%     General:  Well-nourished and well-built.  Eyes: Anicteric no pallor.  ENT: No discharge from ears eyes and nose. Tongue appears moist.  Neck: No mass felt.  Cardiovascular: S1-S2 heard.  Respiratory: No rhonchi no crepitations.  Abdomen: Soft nontender bowel sounds present.  Skin: No rash.  Musculoskeletal: Nontender.  Psychiatric: Appears normal.  Neurologic: Alert awake oriented to time place and person moves upper and lower extremities.  Labs on Admission:  Basic Metabolic Panel:  Recent Labs Lab 03/27/12 0307  NA 135  K 4.1  CL 102  CO2 22  GLUCOSE 154*  BUN 20  CREATININE 1.13  CALCIUM 8.2*   Liver Function Tests:  Recent Labs Lab 03/27/12 0307  AST 57*  ALT 51  ALKPHOS 77  BILITOT 0.4  PROT 7.1  ALBUMIN 2.9*    Recent Labs Lab 03/27/12 0307  LIPASE 1981*   No results found for this basename: AMMONIA,  in the last 168 hours CBC:  Recent Labs Lab 03/27/12 0307  WBC 6.7  NEUTROABS 3.8  HGB 14.6  HCT 39.7  MCV 75.3*  PLT 174   Cardiac Enzymes:  Recent Labs Lab 03/27/12 0400  TROPONINI <0.30    BNP (last 3 results)  Recent Labs  04/11/11 2035  PROBNP 525.9*   CBG: No results found for this basename: GLUCAP,  in the last 168 hours  Radiological Exams on Admission: Ct Abdomen Pelvis W Contrast  03/27/2012  *RADIOLOGY REPORT*  Clinical Data: Upper abdominal pain  CT ABDOMEN AND PELVIS WITH CONTRAST  Technique:  Multidetector CT imaging of the abdomen and pelvis was performed following the standard protocol during bolus administration of intravenous contrast.  Contrast: 169mL OMNIPAQUE IOHEXOL 300 MG/ML  SOLN  Comparison: 03/27/2012 radiograph  Findings: Mild bibasilar scarring or atelectasis.  Mild circumferential distal esophageal wall thickening.  Heart size within normal limits.  No pleural or pericardial effusion.  Unremarkable liver, spleen, adrenal glands.  Absent gallbladder. No biliary ductal dilatation.  There  is mild pancreatic edema and peripancreatic stranding / fluid anterior to the tail and along the pancreaticoduodenal groove.  Maintained enhancement, with mild head heterogeneity likely due to edema.  No evidence for necrosis or pseudocyst.  Symmetric renal enhancement.  No hydronephrosis or hydroureter.  No CT evidence for colitis.  Normal appendix.  Small bowel loops are normal course and caliber. No free intraperitoneal air or fluid.  No lymphadenopathy.  Normal caliber aorta.  Mild celiac axis origin narrowing with prostatic dilatation up to 9 mm.  Patent portal veins, SMV, and splenic vein.  Thin-walled bladder.  No acute osseous finding.  IMPRESSION: Pancreatic edema and mild peripancreatic fat stranding, suggests acute pancreatitis.  No CT evidence for necrosis.  Circumferential distal esophageal wall thickening; nonspecific however can be seen with esophagitis or gastroesophageal reflux disease.   Original Report Authenticated By: Carlos Levering, M.D.    Dg Abd Acute W/chest  03/27/2012  *RADIOLOGY REPORT*  Clinical Data: Abdominal pain  ACUTE ABDOMEN SERIES (ABDOMEN 2 VIEW & CHEST 1 VIEW)  Comparison: 08/18/2009  Findings: Lungs remain clear.  Cardiomediastinal contours within normal range.  Partially imaged cervical fusion hardware.  No free intraperitoneal air.  Surgical clips right upper quadrant. The bowel gas pattern is non-obstructive. Organ outlines are normal where seen. No acute or aggressive osseous abnormality identified.  IMPRESSION: Nonobstructive bowel gas pattern.   Original Report Authenticated By: Carlos Levering, M.D.     EKG: Independently reviewed. Sinus rhythm with T-wave changes in the lateral leads.  Assessment/Plan Principal Problem:   Acute pancreatitis Active Problems:   Hypertension   Hyperlipidemia   Diabetes mellitus   1. Acute pancreatitis - patient states he does not drink alcohol and has had previous cholecystectomy. At this time the cause for his  pancreatitis is not clear. Check lipid panel to look for hypertriglyceridemia as a cause. Keep patient n.p.o. with pain relief medication and IV hydration. 2. Hypertension - since patient n.p.o. We will keep patient on when necessary IV hydralazine for systolic blood pressure more than 160. 3. Hyperlipidemia - check lipid panel. See #1. 4. Diabetes mellitus type 2 - keep patient on sliding-scale coverage for now since patient is n.p.o.  No consults requested. if consultant consulted, please document name and whether formally or informally consulted  Code Status: Full code. (must indicate code status--if unknown or must be presumed, indicate so) Family Communication: Family at the bedside. (indicate person spoken with, if applicable, with phone number if by telephone) Disposition Plan: Admit to inpatient.   Pedro Whiters N. [facility] Pager [phone].  If 7PM-7AM, please contact night-coverage [email or web address] Password [email or web address] 03/27/2012, 6:24 AM

## 2012-03-27 NOTE — Progress Notes (Signed)
Patient seen and examined. Admitted with acute pancreatitis. Continue current plan of care as outlined by Dr. Hal Hope.  Barry Lopez 03/27/2012 5:13 PM

## 2012-03-27 NOTE — ED Provider Notes (Signed)
History     CSN: [id number]  Arrival date & time 03/27/12  0234   First MD Initiated Contact with Patient 03/27/12 0246      Chief Complaint  Patient presents with  . Abdominal Pain    (Consider location/radiation/quality/duration/timing/severity/associated sxs/prior treatment) Patient is a 57 y.o. male presenting with abdominal pain. The history is provided by the patient.  Abdominal Pain Pain location:  Generalized (primarily epigastric) Pain quality comment:  Cant descring "it's hurtin thats all I know" Pain radiates to:  Does not radiate Pain severity:  Moderate Onset quality:  Sudden Duration:  1 day Timing:  Constant (slightly better at church this morning, worse tonight) Progression:  Worsening Chronicity:  New Context: awakening from sleep and previous surgery (1997- "bowel stopage" reconstructed)   Context: not alcohol use, not diet changes, not recent travel and not trauma   Relieved by:  Nothing Worsened by:  Eating, coughing, deep breathing, palpation and position changes Ineffective treatments: pepto bismol. Associated symptoms: anorexia and constipation (LNBM was sat and normally goes every day )   Associated symptoms: no belching, no chest pain, no cough, no diarrhea, no dysuria, no fever, no flatus, no hematuria, no melena, no shortness of breath and no vomiting   Risk factors: no alcohol abuse, no NSAID use and no recent hospitalization     Past Medical History  Diagnosis Date  . Diabetes mellitus   . Hypertension   . Hypercholesteremia   . Degenerative disc disease   . Degenerative cervical disc     Past Surgical History  Procedure Laterality Date  . Bowel resection    . Neck surgery    . Tonsillectomy    . Hernia repair      YEARS AGO  . Cholecystectomy    . Anterior cervical decomp/discectomy fusion  12/17/2011    Procedure: ANTERIOR CERVICAL DECOMPRESSION/DISCECTOMY FUSION 1 LEVEL;  Surgeon: Erline Levine, MD;  Location: [place] [facility];  Service:  Neurosurgery;  Laterality: N/A;  Exploration of Fusion and Anterior Cervical Six-Seven Decompression/Diskectomy/Fusion  . Posterior cervical fusion/foraminotomy  12/17/2011    Procedure: POSTERIOR CERVICAL FUSION/FORAMINOTOMY LEVEL 4;  Surgeon: Erline Levine, MD;  Location: [facility];  Service: Neurosurgery;;  Posterior Cervical Three-Seven Fusion    Family History  Problem Relation Age of Onset  . Heart failure Mother     History  Substance Use Topics  . Smoking status: Never Smoker   . Smokeless tobacco: Not on file  . Alcohol Use: No      Review of Systems  Constitutional: Negative for fever, diaphoresis and activity change.  HENT: Negative for congestion and neck pain.   Respiratory: Negative for cough and shortness of breath.   Cardiovascular: Negative for chest pain.  Gastrointestinal: Positive for abdominal pain, constipation (LNBM was sat and normally goes every day ) and anorexia. Negative for vomiting, diarrhea, melena and flatus.  Genitourinary: Negative for dysuria and hematuria.  Musculoskeletal: Negative for myalgias.  Skin: Negative for color change and wound.  Neurological: Negative for headaches.  All other systems reviewed and are negative.    Allergies  Penicillins  Home Medications   Current Outpatient Rx  Name  Route  Sig  Dispense  Refill  . diazepam (VALIUM) 5 MG tablet   Oral   Take 1 tablet (5 mg total) by mouth every 6 (six) hours as needed.   60 tablet   0   . lisinopril (PRINIVIL,ZESTRIL) 20 MG tablet   Oral   Take 20 mg by  mouth 2 (two) times daily.         . metFORMIN (GLUCOPHAGE) 500 MG tablet   Oral   Take 500 mg by mouth 2 (two) times daily with a meal.         . oxyCODONE-acetaminophen (PERCOCET/ROXICET) 5-325 MG per tablet   Oral   Take 1-2 tablets by mouth every 4 (four) hours as needed.   80 tablet   0   . simvastatin (ZOCOR) 20 MG tablet   Oral   Take 20 mg by mouth daily.         . sitaGLIPtin (JANUVIA) 100  MG tablet   Oral   Take 100 mg by mouth daily.         . Vitamin D, Ergocalciferol, (DRISDOL) 50000 UNITS CAPS   Oral   Take 50,000 Units by mouth every 7 (seven) days. Sundays           BP 172/86  Pulse 90  Temp(Src) 99.2 F (37.3 C) (Oral)  Resp 22  Ht 5\' 8"  (1.727 m)  Wt 185 lb 12.8 oz (84.278 kg)  BMI 28.26 kg/m2  SpO2 98%  Physical Exam  Nursing note and vitals reviewed. Constitutional: He is oriented to person, place, and time. He appears well-developed and well-nourished. No distress.  HENT:  Head: Normocephalic and atraumatic.  Mouth/Throat: Oropharynx is clear and moist. No oropharyngeal exudate.  Eyes: Conjunctivae and EOM are normal. Pupils are equal, round, and reactive to light. No scleral icterus.  Neck: Normal range of motion. Neck supple. No tracheal deviation present. No thyromegaly present.  Cardiovascular: Normal rate, regular rhythm, normal heart sounds and intact distal pulses.   Pulmonary/Chest: Effort normal and breath sounds normal. No stridor. No respiratory distress. He has no wheezes.  Abdominal: Soft. He exhibits distension.  Abdomen hard with normal bowel sounds. Diffuse tenderness, worse in epigastric region  Musculoskeletal: Normal range of motion. He exhibits no edema and no tenderness.  Neurological: He is alert and oriented to person, place, and time. Coordination normal.  Skin: Skin is warm and dry. No rash noted. He is not diaphoretic. No erythema. No pallor.  Psychiatric: He has a normal mood and affect. His behavior is normal.    ED Course  Procedures (including critical care time)  Labs Reviewed  CBC WITH DIFFERENTIAL  COMPREHENSIVE METABOLIC PANEL  URINALYSIS, MICROSCOPIC ONLY   No results found.   No diagnosis found.   Date: 03/27/2012  Rate: 85  Rhythm: normal sinus rhythm  QRS Axis: normal  Intervals: normal  ST/T Wave abnormalities: ST elevations anteriorly  Conduction Disutrbances:none  Narrative  Interpretation:   Old EKG Reviewed: old ecg shown nonspecific ST changes Above reviewed with attending, istat added.    MDM  Pancreatitis  Pt w a hx of DM and HTN presents to the ER c/o epigastric pain onset acutely this morning and gradually worsening. Labs and imaging reviewed shown an elevated lipase of 1981, CT ordered and pending. Pt & nausea managed in the ED, no current complaints at this time. IVFs given and pt kept NPO for bowel rest. As pt has no history of pancreatitis and states that he refrains from alcohol use he will be admitted for further evaluation, observation & pain management.  The patient appears reasonably stabilized for admission considering the current resources, flow, and capabilities available in the ED at this time, and I doubt any other [facility] requiring further screening and/or treatment in the ED prior to admission.  Verl Dicker, [place] 03/27/12 [phone]

## 2012-03-27 NOTE — ED Notes (Signed)
Report called to [place], [place]; pt stable at time of transfer

## 2012-03-27 NOTE — ED Notes (Signed)
Pt c/o abdominal pain x 24 hrs. Pt c/o nausea. Pt last had normal BM on Sat p.m. Pt presents firm abdomen, tender to palpation in epigastric area. Hyperactive BS in RUQ and RLQ.

## 2012-03-27 NOTE — ED Provider Notes (Signed)
Medical screening examination/treatment/procedure(s) were conducted as a shared visit with non-physician practitioner(s) and myself.  I personally evaluated the patient during the encounter  Patient with new diagnosis of pancreatitis.  CT without complicating factors.  The patient's pain is improving.  Admit to the hospital for further workup and symptomatic treatment. Bowel Rest  1. Pancreatitis    I personally reviewed the imaging tests through PACS system I reviewed available ER/hospitalization records through the EMR Ct Abdomen Pelvis W Contrast  03/27/2012  *RADIOLOGY REPORT*  Clinical Data: Upper abdominal pain  CT ABDOMEN AND PELVIS WITH CONTRAST  Technique:  Multidetector CT imaging of the abdomen and pelvis was performed following the standard protocol during bolus administration of intravenous contrast.  Contrast: 147mL OMNIPAQUE IOHEXOL 300 MG/ML  SOLN  Comparison: 03/27/2012 radiograph  Findings: Mild bibasilar scarring or atelectasis.  Mild circumferential distal esophageal wall thickening.  Heart size within normal limits.  No pleural or pericardial effusion.  Unremarkable liver, spleen, adrenal glands.  Absent gallbladder. No biliary ductal dilatation.  There is mild pancreatic edema and peripancreatic stranding / fluid anterior to the tail and along the pancreaticoduodenal groove.  Maintained enhancement, with mild head heterogeneity likely due to edema.  No evidence for necrosis or pseudocyst.  Symmetric renal enhancement.  No hydronephrosis or hydroureter.  No CT evidence for colitis.  Normal appendix.  Small bowel loops are normal course and caliber. No free intraperitoneal air or fluid.  No lymphadenopathy.  Normal caliber aorta.  Mild celiac axis origin narrowing with prostatic dilatation up to 9 mm.  Patent portal veins, SMV, and splenic vein.  Thin-walled bladder.  No acute osseous finding.  IMPRESSION: Pancreatic edema and mild peripancreatic fat stranding, suggests acute  pancreatitis.  No CT evidence for necrosis.  Circumferential distal esophageal wall thickening; nonspecific however can be seen with esophagitis or gastroesophageal reflux disease.   Original Report Authenticated By: Carlos Levering, M.D.    Dg Abd Acute W/chest  03/27/2012  *RADIOLOGY REPORT*  Clinical Data: Abdominal pain  ACUTE ABDOMEN SERIES (ABDOMEN 2 VIEW & CHEST 1 VIEW)  Comparison: 08/18/2009  Findings: Lungs remain clear.  Cardiomediastinal contours within normal range.  Partially imaged cervical fusion hardware.  No free intraperitoneal air.  Surgical clips right upper quadrant. The bowel gas pattern is non-obstructive. Organ outlines are normal where seen. No acute or aggressive osseous abnormality identified.  IMPRESSION: Nonobstructive bowel gas pattern.   Original Report Authenticated By: Carlos Levering, M.D.   Results for orders placed during the hospital encounter of 03/27/12  CBC WITH DIFFERENTIAL      Result Value Range   WBC 6.7  4.0 - 10.5 K/uL   RBC 5.27  4.22 - 5.81 MIL/uL   Hemoglobin 14.6  13.0 - 17.0 g/dL   HCT 39.7  39.0 - 52.0 %   MCV 75.3 (*) 78.0 - 100.0 fL   MCH 27.7  26.0 - 34.0 pg   MCHC 36.8 (*) 30.0 - 36.0 g/dL   RDW 12.5  11.5 - 15.5 %   Platelets 174  150 - 400 K/uL   Neutrophils Relative 56  43 - 77 %   Neutro Abs 3.8  1.7 - 7.7 K/uL   Lymphocytes Relative 24  12 - 46 %   Lymphs Abs 1.6  0.7 - 4.0 K/uL   Monocytes Relative 13 (*) 3 - 12 %   Monocytes Absolute 0.9  0.1 - 1.0 K/uL   Eosinophils Relative 6 (*) 0 - 5 %   Eosinophils  Absolute 0.4  0.0 - 0.7 K/uL   Basophils Relative 0  0 - 1 %   Basophils Absolute 0.0  0.0 - 0.1 K/uL  COMPREHENSIVE METABOLIC PANEL      Result Value Range   Sodium 135  135 - 145 mEq/L   Potassium 4.1  3.5 - 5.1 mEq/L   Chloride 102  96 - 112 mEq/L   CO2 22  19 - 32 mEq/L   Glucose, Bld 154 (*) 70 - 99 mg/dL   BUN 20  6 - 23 mg/dL   Creatinine, Ser 1.13  0.50 - 1.35 mg/dL   Calcium 8.2 (*) 8.4 - 10.5 mg/dL   Total  Protein 7.1  6.0 - 8.3 g/dL   Albumin 2.9 (*) 3.5 - 5.2 g/dL   AST 57 (*) 0 - 37 U/L   ALT 51  0 - 53 U/L   Alkaline Phosphatase 77  39 - 117 U/L   Total Bilirubin 0.4  0.3 - 1.2 mg/dL   GFR calc non Af Amer 71 (*) >90 mL/min   GFR calc Af Amer 82 (*) >90 mL/min  URINALYSIS, MICROSCOPIC ONLY      Result Value Range   Color, Urine YELLOW  YELLOW   APPearance CLOUDY (*) CLEAR   Specific Gravity, Urine 1.017  1.005 - 1.030   pH 5.5  5.0 - 8.0   Glucose, UA NEGATIVE  NEGATIVE mg/dL   Hgb urine dipstick LARGE (*) NEGATIVE   Bilirubin Urine NEGATIVE  NEGATIVE   Ketones, ur NEGATIVE  NEGATIVE mg/dL   Protein, ur >300 (*) NEGATIVE mg/dL   Urobilinogen, UA 0.2  0.0 - 1.0 mg/dL   Nitrite NEGATIVE  NEGATIVE   Leukocytes, UA NEGATIVE  NEGATIVE   RBC / HPF 7-10  <3 RBC/hpf   Bacteria, UA RARE  RARE   Squamous Epithelial / LPF RARE  RARE  LIPASE, BLOOD      Result Value Range   Lipase 1981 (*) 11 - 59 U/L  TROPONIN I      Result Value Range   Troponin I <0.30  <0.30 ng/mL     Hoy Morn, MD 03/27/12 [phone]

## 2012-03-27 NOTE — ED Notes (Signed)
Patient transported to X-ray 

## 2012-03-27 NOTE — ED Notes (Signed)
Patient transported to CT 

## 2012-03-28 DIAGNOSIS — E781 Pure hyperglyceridemia: Secondary | ICD-10-CM | POA: Diagnosis present

## 2012-03-28 DIAGNOSIS — R509 Fever, unspecified: Secondary | ICD-10-CM | POA: Diagnosis present

## 2012-03-28 LAB — COMPREHENSIVE METABOLIC PANEL
ALT: 45 U/L (ref 0–53)
AST: 41 U/L — ABNORMAL HIGH (ref 0–37)
Albumin: 2.5 g/dL — ABNORMAL LOW (ref 3.5–5.2)
Alkaline Phosphatase: 63 U/L (ref 39–117)
BUN: 15 mg/dL (ref 6–23)
CO2: 24 mEq/L (ref 19–32)
Calcium: 7.8 mg/dL — ABNORMAL LOW (ref 8.4–10.5)
Chloride: 105 mEq/L (ref 96–112)
Creatinine, Ser: 1.27 mg/dL (ref 0.50–1.35)
GFR calc Af Amer: 71 mL/min — ABNORMAL LOW (ref >90)
GFR calc non Af Amer: 62 mL/min — ABNORMAL LOW (ref >90)
Glucose, Bld: 103 mg/dL — ABNORMAL HIGH (ref 70–99)
Potassium: 3.8 mEq/L (ref 3.5–5.1)
Sodium: 138 mEq/L (ref 135–145)
Total Bilirubin: 0.7 mg/dL (ref 0.3–1.2)
Total Protein: 6.3 g/dL (ref 6.0–8.3)

## 2012-03-28 LAB — CBC WITH DIFFERENTIAL/PLATELET
Basophils Absolute: 0 10*3/uL (ref 0.0–0.1)
Basophils Relative: 0 % (ref 0–1)
Eosinophils Absolute: 0.2 10*3/uL (ref 0.0–0.7)
Eosinophils Relative: 2 % (ref 0–5)
HCT: 38.1 % — ABNORMAL LOW (ref 39.0–52.0)
Hemoglobin: 13.8 g/dL (ref 13.0–17.0)
Lymphocytes Relative: 18 % (ref 12–46)
Lymphs Abs: 1.6 10*3/uL (ref 0.7–4.0)
MCH: 27.6 pg (ref 26.0–34.0)
MCHC: 36.2 g/dL — ABNORMAL HIGH (ref 30.0–36.0)
MCV: 76.2 fL — ABNORMAL LOW (ref 78.0–100.0)
Monocytes Absolute: 0.9 10*3/uL (ref 0.1–1.0)
Monocytes Relative: 11 % (ref 3–12)
Neutro Abs: 6.3 10*3/uL (ref 1.7–7.7)
Neutrophils Relative %: 70 % (ref 43–77)
Platelets: 143 10*3/uL — ABNORMAL LOW (ref 150–400)
RBC: 5 MIL/uL (ref 4.22–5.81)
RDW: 12.8 % (ref 11.5–15.5)
WBC: 9 10*3/uL (ref 4.0–10.5)

## 2012-03-28 LAB — GLUCOSE, CAPILLARY
Glucose-Capillary: 101 mg/dL — ABNORMAL HIGH (ref 70–99)
Glucose-Capillary: 104 mg/dL — ABNORMAL HIGH (ref 70–99)
Glucose-Capillary: 127 mg/dL — ABNORMAL HIGH (ref 70–99)
Glucose-Capillary: 143 mg/dL — ABNORMAL HIGH (ref 70–99)
Glucose-Capillary: 86 mg/dL (ref 70–99)
Glucose-Capillary: 87 mg/dL (ref 70–99)

## 2012-03-28 LAB — LIPASE, BLOOD: Lipase: 91 U/L — ABNORMAL HIGH (ref 11–59)

## 2012-03-28 MED ORDER — INSULIN ASPART 100 UNIT/ML ~~LOC~~ SOLN
0.0000 [IU] | Freq: Three times a day (TID) | SUBCUTANEOUS | Status: DC
  Administered 2012-03-28 – 2012-03-29 (×3): 1 [IU] via SUBCUTANEOUS

## 2012-03-28 MED ORDER — AMLODIPINE BESYLATE 5 MG PO TABS
5.0000 mg | ORAL_TABLET | Freq: Every day | ORAL | Status: DC
  Administered 2012-03-28 – 2012-03-29 (×2): 5 mg via ORAL
  Filled 2012-03-28 (×2): qty 1

## 2012-03-28 MED ORDER — INSULIN ASPART 100 UNIT/ML ~~LOC~~ SOLN: 0.0000 [IU] | Freq: Every day | SUBCUTANEOUS | Status: DC

## 2012-03-28 NOTE — Progress Notes (Signed)
[facility] PROGRESS NOTE  DOMINQUE KRAVCHUK [id number] DOB: Jan 02, 1956 DOA: 03/27/2012 PCP: Philis Fendt, MD  Brief narrative: Barry Lopez is an 57 y.o. male with a past medical history of diabetes, hypertension, and hyperlipidemia who was admitted with abdominal pain secondary to acute pancreatitis on 03/27/2012.  Assessment/Plan: Principal Problem:   Acute pancreatitis with low-grade fever -Responding well to bowel rest. Lipase dramatically down. -Unclear trigger. Triglycerides only mildly elevated. Patient is status post cholecystectomy. May have passed a retained gallstone or may be an adverse reaction to lisinopril, Zocor, or Januvia, all of which list pancreatitis among their documented adverse reactions. -Advance diet to clear liquids and then advance further as tolerated. -Fever likely from inflammatory process. No current indications for antibiotics. Active Problems:   Hypertension -Lisinopril currently on hold. We'll start Norvasc.   Hyperlipidemia -Continue to hold statin.   Diabetes mellitus -Continue to hold Januvia and metformin. -Continue sliding scale insulin for now.   Code Status: Full. Family Communication: None at bedside Disposition Plan: Home when stable, possibly in next 24-48 hours.   Medical Consultants:  None.  Other Consultants:  None.  Anti-infectives:  None.  HPI/Subjective: Barry Lopez is feeling better. He is running a low-grade fever, but no chills. He still has some soreness in his mid epigastric area but no nausea or vomiting  Objective: Filed Vitals:   03/27/12 2201 03/27/12 2249 03/28/12 0044 03/28/12 0541  BP: 166/82 170/86 151/70 158/71  Pulse:  120 110 100  Temp:  100.9 F (38.3 C) 99.9 F (37.7 C) 98.9 F (37.2 C)  TempSrc:  Oral Oral Oral  Resp:  20  18  Height:      Weight:    83.1 kg (183 lb 3.2 oz)  SpO2:  98%  100%    Intake/Output Summary (Last 24 hours) at 03/28/12 1228 Last data filed at  03/28/12 1036  Gross per 24 hour  Intake   1780 ml  Output   1745 ml  Net     35 ml    Exam: Gen:  NAD Cardiovascular:  RRR, No M/R/G Respiratory:  Lungs CTAB Gastrointestinal:  Abdomen soft, mildly tender, + BS Extremities:  No C/E/C  Data Reviewed: Basic Metabolic Panel:  Recent Labs Lab 03/27/12 0307 03/28/12 0450  NA 135 138  K 4.1 3.8  CL 102 105  CO2 22 24  GLUCOSE 154* 103*  BUN 20 15  CREATININE 1.13 1.27  CALCIUM 8.2* 7.8*   GFR Estimated Creatinine Clearance: 68.3 ml/min (by C-G formula based on Cr of 1.27). Liver Function Tests:  Recent Labs Lab 03/27/12 0307 03/28/12 0450  AST 57* 41*  ALT 51 45  ALKPHOS 77 63  BILITOT 0.4 0.7  PROT 7.1 6.3  ALBUMIN 2.9* 2.5*    Recent Labs Lab 03/27/12 0307 03/27/12 1401 03/28/12 0450  LIPASE 1981* 644* 91*   CBC:  Recent Labs Lab 03/27/12 0307 03/28/12 0450  WBC 6.7 9.0  NEUTROABS 3.8 6.3  HGB 14.6 13.8  HCT 39.7 38.1*  MCV 75.3* 76.2*  PLT 174 143*   Cardiac Enzymes:  Recent Labs Lab 03/27/12 0400 03/27/12 1207  TROPONINI <0.30 <0.30   BNP (last 3 results)  Recent Labs  04/11/11 2035  PROBNP 525.9*   CBG:  Recent Labs Lab 03/27/12 2038 03/28/12 0013 03/28/12 0500 03/28/12 0740 03/28/12 1208  GLUCAP 94 104* 101* 87 86   Lipid Profile  Recent Labs  03/27/12 1207  CHOL 154  HDL 54  LDLCALC 66  TRIG 171*  CHOLHDL 2.9     Procedures and Diagnostic Studies: Ct Abdomen Pelvis W Contrast  03/27/2012  *RADIOLOGY REPORT*  Clinical Data: Upper abdominal pain  CT ABDOMEN AND PELVIS WITH CONTRAST  Technique:  Multidetector CT imaging of the abdomen and pelvis was performed following the standard protocol during bolus administration of intravenous contrast.  Contrast: 159mL OMNIPAQUE IOHEXOL 300 MG/ML  SOLN  Comparison: 03/27/2012 radiograph  Findings: Mild bibasilar scarring or atelectasis.  Mild circumferential distal esophageal wall thickening.  Heart size within normal  limits.  No pleural or pericardial effusion.  Unremarkable liver, spleen, adrenal glands.  Absent gallbladder. No biliary ductal dilatation.  There is mild pancreatic edema and peripancreatic stranding / fluid anterior to the tail and along the pancreaticoduodenal groove.  Maintained enhancement, with mild head heterogeneity likely due to edema.  No evidence for necrosis or pseudocyst.  Symmetric renal enhancement.  No hydronephrosis or hydroureter.  No CT evidence for colitis.  Normal appendix.  Small bowel loops are normal course and caliber. No free intraperitoneal air or fluid.  No lymphadenopathy.  Normal caliber aorta.  Mild celiac axis origin narrowing with prostatic dilatation up to 9 mm.  Patent portal veins, SMV, and splenic vein.  Thin-walled bladder.  No acute osseous finding.  IMPRESSION: Pancreatic edema and mild peripancreatic fat stranding, suggests acute pancreatitis.  No CT evidence for necrosis.  Circumferential distal esophageal wall thickening; nonspecific however can be seen with esophagitis or gastroesophageal reflux disease.   Original Report Authenticated By: Carlos Levering, M.D.    Dg Abd Acute W/chest  03/27/2012  *RADIOLOGY REPORT*  Clinical Data: Abdominal pain  ACUTE ABDOMEN SERIES (ABDOMEN 2 VIEW & CHEST 1 VIEW)  Comparison: 08/18/2009  Findings: Lungs remain clear.  Cardiomediastinal contours within normal range.  Partially imaged cervical fusion hardware.  No free intraperitoneal air.  Surgical clips right upper quadrant. The bowel gas pattern is non-obstructive. Organ outlines are normal where seen. No acute or aggressive osseous abnormality identified.  IMPRESSION: Nonobstructive bowel gas pattern.   Original Report Authenticated By: Carlos Levering, M.D.     Scheduled Meds: . insulin aspart  0-9 Units Subcutaneous TID WC  . sodium chloride  3 mL Intravenous Q12H   Continuous Infusions: . sodium chloride 125 mL/hr at 03/28/12 1129    Time spent: 25 minutes.    LOS: 1 day   Denyce Harr  [facility] Pager [phone].  If 8PM-8AM, please contact night-coverage at [email or web address], password [facility] 03/28/2012, 12:28 PM

## 2012-03-29 LAB — CBC
HCT: 37.5 % — ABNORMAL LOW (ref 39.0–52.0)
Hemoglobin: 13.4 g/dL (ref 13.0–17.0)
MCH: 27 pg (ref 26.0–34.0)
MCHC: 35.7 g/dL (ref 30.0–36.0)
MCV: 75.5 fL — ABNORMAL LOW (ref 78.0–100.0)
Platelets: 154 10*3/uL (ref 150–400)
RBC: 4.97 MIL/uL (ref 4.22–5.81)
RDW: 12.5 % (ref 11.5–15.5)
WBC: 6.3 10*3/uL (ref 4.0–10.5)

## 2012-03-29 LAB — COMPREHENSIVE METABOLIC PANEL
ALT: 61 U/L — ABNORMAL HIGH (ref 0–53)
AST: 74 U/L — ABNORMAL HIGH (ref 0–37)
Albumin: 2.4 g/dL — ABNORMAL LOW (ref 3.5–5.2)
Alkaline Phosphatase: 62 U/L (ref 39–117)
BUN: 11 mg/dL (ref 6–23)
CO2: 21 mEq/L (ref 19–32)
Calcium: 7.8 mg/dL — ABNORMAL LOW (ref 8.4–10.5)
Chloride: 105 mEq/L (ref 96–112)
Creatinine, Ser: 1.01 mg/dL (ref 0.50–1.35)
GFR calc Af Amer: >90 mL/min (ref >90)
GFR calc non Af Amer: 81 mL/min — ABNORMAL LOW (ref >90)
Glucose, Bld: 97 mg/dL (ref 70–99)
Potassium: 3.6 mEq/L (ref 3.5–5.1)
Sodium: 137 mEq/L (ref 135–145)
Total Bilirubin: 0.7 mg/dL (ref 0.3–1.2)
Total Protein: 6.3 g/dL (ref 6.0–8.3)

## 2012-03-29 LAB — GLUCOSE, CAPILLARY
Glucose-Capillary: 125 mg/dL — ABNORMAL HIGH (ref 70–99)
Glucose-Capillary: 128 mg/dL — ABNORMAL HIGH (ref 70–99)

## 2012-03-29 LAB — LIPASE, BLOOD: Lipase: 51 U/L (ref 11–59)

## 2012-03-29 MED ORDER — GEMFIBROZIL 600 MG PO TABS: 600.0000 mg | ORAL_TABLET | Freq: Two times a day (BID) | ORAL | Status: DC

## 2012-03-29 MED ORDER — METFORMIN HCL 500 MG PO TABS: 750.0000 mg | ORAL_TABLET | Freq: Two times a day (BID) | ORAL | Status: DC

## 2012-03-29 MED ORDER — OXYCODONE HCL 5 MG PO TABS: 5.0000 mg | ORAL_TABLET | ORAL | Status: DC | PRN

## 2012-03-29 MED ORDER — AMLODIPINE BESYLATE 10 MG PO TABS: 10.0000 mg | ORAL_TABLET | Freq: Every day | ORAL | Status: DC

## 2012-03-29 NOTE — Discharge Summary (Addendum)
Physician Discharge Summary  Barry Lopez [id number] DOB: 09/10/1955 DOA: 03/27/2012  PCP: Barry Fendt, MD  Admit date: 03/27/2012 Discharge date: 03/29/2012  Time spent: <77minutes  Recommendations for Outpatient Follow-up:   Discharge Diagnoses:  Principal Problem:   Acute pancreatitis Active Problems:   Hypertension   Hyperlipidemia   Diabetes mellitus   Hypertriglyceridemia   Low grade fever   Discharge Condition: Improved/stable  Diet recommendation: Modified carbohydrate and low fat  Filed Weights   03/27/12 1520 03/28/12 0541 03/29/12 0439  Weight: 84.8 kg (186 lb 15.2 oz) 83.1 kg (183 lb 3.2 oz) 82.7 kg (182 lb 5.1 oz)    History of present illness:  Barry Lopez is Lopez 57 y.o. male history of diabetes mellitus type 2, hypertension and hyperlipidemia started experiencing abdominal pain since yesterday. The pain is located mostly in the epigastric area radiating to the back sharp associated with nausea but no vomiting, denies any diarrhea fever chills. Since the pain is persistent patient came to the ER and labs reveal elevated lipase with CT confirming pancreatitis. Patient at this time as been admitted for further management. Patient's pain is controlled with morphine. Patient otherwise denies any chest pain shortness of breath.      Hospital Course:  Acute pancreatitis with low-grade fever  -As discussed above upon admission he was kept n.p.o., Hydrated with IV fluids and he Responded well to bowel rest. Lipase dramatically down.  -Unclear trigger. Triglycerides only mildly elevated in the 171. Patient is status post cholecystectomy. May have passed Lopez retained gallstone or may be an adverse reaction to lisinopril, Zocor, or Januvia, all of which list pancreatitis among their documented adverse reactions-these medications were discontinued on admission. He was placed on Norvasc instead for hypertension, and the time of discharge today has been changed to Lopid  for his mild hypertriglyceridemia, and his metformin increased to 750 mg twice Lopez day-she is to keep Lopez log of his blood sugars and follow up with his PCP for further monitoring and adjustment of his medications as clinically appropriate. -He was started on Lopez clear liquid diet as he improved and he tolerated this well and was advanced to solids and has continued to tolerated well. His medical he stable for discharge home at this time with outpatient followup, the above medication changes were discussed with patient. Active Problems:  Hypertension  -As discussed above his lisinopril was DC'd secondary to #1, he was started on Norvasc and the dose adjusted to 10 mg today and his to continue this upon discharge and followup with his PCP. Hyperlipidemia  -Continue to hold statin.  Diabetes mellitus  -Januvia was discontinued secondary to #1, his Accu-Cheks were monitored and her score with sliding scale insulin. As discussed above his metformin upon Discharge has been changed to 750 twice Lopez day and he is to keep Lopez log of his blood sugars and followup with his PCP.     Procedures:  none  Consultations:  none  Discharge Exam: Filed Vitals:   03/29/12 0531 03/29/12 0532 03/29/12 1107 03/29/12 1401  BP: 180/70 160/77 162/89 157/92  Pulse:    91  Temp:    98.3 F (36.8 C)  TempSrc:    Oral  Resp:    20  Height:      Weight:      SpO2:    100%   Exam:  Gen: NAD  Cardiovascular: RRR, No M/R/G  Respiratory: Lungs CTAB  Gastrointestinal: Abdomen soft, mildly tender, + BS  Extremities:  No C/E/C   Discharge Instructions  Discharge Orders   Future Orders Complete By Expires     Diet Carb Modified  As directed     Scheduling Instructions:      And low fat    Increase activity slowly  As directed         Medication List    STOP taking these medications       simvastatin 20 MG tablet  Commonly known as:  ZOCOR     sitaGLIPtin 100 MG tablet  Commonly known as:  JANUVIA     lisinopril 20 MG tablet  Commonly known as:  PRINIVIL,ZESTRIL  Take 20 mg by mouth daily.    TAKE these medications       amLODipine 10 MG tablet  Commonly known as:  NORVASC  Take 1 tablet (10 mg total) by mouth daily.     diazepam 5 MG tablet  Commonly known as:  VALIUM  Take 1 tablet (5 mg total) by mouth every 6 (six) hours as needed.     gemfibrozil 600 MG tablet  Commonly known as:  LOPID  Take 1 tablet (600 mg total) by mouth 2 (two) times daily before Lopez meal.              metFORMIN 500 MG tablet  Commonly known as:  GLUCOPHAGE  Take 1.5 tablets (750 mg total) by mouth 2 (two) times daily with Lopez meal.     VISTARIL 25 MG capsule  Generic drug:  hydrOXYzine  Take 25 mg by mouth 3 (three) times daily as needed.     Vitamin D (Ergocalciferol) 50000 UNITS Caps  Commonly known as:  DRISDOL  Take 50,000 Units by mouth every 7 (seven) days. Sundays           Follow-up Information   Follow up with Barry Ebbs A, MD. (in [place], call for appt upon discharge)    Contact information:   [place] [address] [phone]        The results of significant diagnostics from this hospitalization (including imaging, microbiology, ancillary and laboratory) are listed below for reference.    Significant Diagnostic Studies: Ct Abdomen Pelvis W Contrast  03/27/2012  *RADIOLOGY REPORT*  Clinical Data: Upper abdominal pain  CT ABDOMEN AND PELVIS WITH CONTRAST  Technique:  Multidetector CT imaging of the abdomen and pelvis was performed following the standard protocol during bolus administration of intravenous contrast.  Contrast: 136mL OMNIPAQUE IOHEXOL 300 MG/ML  SOLN  Comparison: 03/27/2012 radiograph  Findings: Mild bibasilar scarring or atelectasis.  Mild circumferential distal esophageal wall thickening.  Heart size within normal limits.  No pleural or pericardial effusion.  Unremarkable liver, spleen, adrenal glands.  Absent gallbladder. No biliary  ductal dilatation.  There is mild pancreatic edema and peripancreatic stranding / fluid anterior to the tail and along the pancreaticoduodenal groove.  Maintained enhancement, with mild head heterogeneity likely due to edema.  No evidence for necrosis or pseudocyst.  Symmetric renal enhancement.  No hydronephrosis or hydroureter.  No CT evidence for colitis.  Normal appendix.  Small bowel loops are normal course and caliber. No free intraperitoneal air or fluid.  No lymphadenopathy.  Normal caliber aorta.  Mild celiac axis origin narrowing with prostatic dilatation up to 9 mm.  Patent portal veins, SMV, and splenic vein.  Thin-walled bladder.  No acute osseous finding.  IMPRESSION: Pancreatic edema and mild peripancreatic fat stranding, suggests acute pancreatitis.  No CT evidence for necrosis.  Circumferential distal esophageal  wall thickening; nonspecific however can be seen with esophagitis or gastroesophageal reflux disease.   Original Report Authenticated By: Carlos Levering, M.D.    Dg Abd Acute W/chest  03/27/2012  *RADIOLOGY REPORT*  Clinical Data: Abdominal pain  ACUTE ABDOMEN SERIES (ABDOMEN 2 VIEW & CHEST 1 VIEW)  Comparison: 08/18/2009  Findings: Lungs remain clear.  Cardiomediastinal contours within normal range.  Partially imaged cervical fusion hardware.  No free intraperitoneal air.  Surgical clips right upper quadrant. The bowel gas pattern is non-obstructive. Organ outlines are normal where seen. No acute or aggressive osseous abnormality identified.  IMPRESSION: Nonobstructive bowel gas pattern.   Original Report Authenticated By: Carlos Levering, M.D.     Microbiology: No results found for this or any previous visit (from the past 240 hour(s)).   Labs: Basic Metabolic Panel:  Recent Labs Lab 03/27/12 0307 03/28/12 0450 03/29/12 0445  NA 135 138 137  K 4.1 3.8 3.6  CL 102 105 105  CO2 22 24 21   GLUCOSE 154* 103* 97  BUN 20 15 11   CREATININE 1.13 1.27 1.01  CALCIUM 8.2*  7.8* 7.8*   Liver Function Tests:  Recent Labs Lab 03/27/12 0307 03/28/12 0450 03/29/12 0445  AST 57* 41* 74*  ALT 51 45 61*  ALKPHOS 77 63 62  BILITOT 0.4 0.7 0.7  PROT 7.1 6.3 6.3  ALBUMIN 2.9* 2.5* 2.4*    Recent Labs Lab 03/27/12 0307 03/27/12 1401 03/28/12 0450 03/29/12 0445  LIPASE 1981* 644* 91* 51   No results found for this basename: AMMONIA,  in the last 168 hours CBC:  Recent Labs Lab 03/27/12 0307 03/28/12 0450 03/29/12 0445  WBC 6.7 9.0 6.3  NEUTROABS 3.8 6.3  --   HGB 14.6 13.8 13.4  HCT 39.7 38.1* 37.5*  MCV 75.3* 76.2* 75.5*  PLT 174 143* 154   Cardiac Enzymes:  Recent Labs Lab 03/27/12 0400 03/27/12 1207  TROPONINI <0.30 <0.30   BNP: BNP (last 3 results)  Recent Labs  04/11/11 2035  PROBNP 525.9*   CBG:  Recent Labs Lab 03/28/12 1208 03/28/12 1611 03/28/12 2157 03/29/12 0726 03/29/12 1153  GLUCAP 86 127* 143* 125* 128*       Signed:  Juliann Olesky C  [facility] 03/29/2012, 3:31 PM

## 2012-06-22 ENCOUNTER — Other Ambulatory Visit: Payer: Self-pay | Admitting: Internal Medicine

## 2012-06-22 NOTE — Telephone Encounter (Signed)
This patient was never seen in [place].  She needs to get her refills from her PCP Dr. Jeanie Cooks.  Pt saw Dr. Inis Sizer in the hospital only.   Gerlene Fee, MD, CDE, [place], [place]

## 2012-07-11 ENCOUNTER — Emergency Department (HOSPITAL_COMMUNITY): Payer: BC Managed Care – PPO

## 2012-07-11 ENCOUNTER — Encounter (HOSPITAL_COMMUNITY): Payer: Self-pay | Admitting: Emergency Medicine

## 2012-07-11 ENCOUNTER — Emergency Department (HOSPITAL_COMMUNITY)
Admission: EM | Admit: 2012-07-11 | Discharge: 2012-07-12 | Disposition: A | Discharge status: Home / Self Care | Payer: BC Managed Care – PPO | Attending: Emergency Medicine | Admitting: Emergency Medicine

## 2012-07-11 DIAGNOSIS — R079 Chest pain, unspecified: Secondary | ICD-10-CM | POA: Insufficient documentation

## 2012-07-11 DIAGNOSIS — R6883 Chills (without fever): Secondary | ICD-10-CM | POA: Insufficient documentation

## 2012-07-11 DIAGNOSIS — Z8719 Personal history of other diseases of the digestive system: Secondary | ICD-10-CM | POA: Insufficient documentation

## 2012-07-11 DIAGNOSIS — Z8739 Personal history of other diseases of the musculoskeletal system and connective tissue: Secondary | ICD-10-CM | POA: Insufficient documentation

## 2012-07-11 DIAGNOSIS — R109 Unspecified abdominal pain: Secondary | ICD-10-CM

## 2012-07-11 DIAGNOSIS — Z79899 Other long term (current) drug therapy: Secondary | ICD-10-CM | POA: Insufficient documentation

## 2012-07-11 DIAGNOSIS — E119 Type 2 diabetes mellitus without complications: Secondary | ICD-10-CM

## 2012-07-11 DIAGNOSIS — I10 Essential (primary) hypertension: Secondary | ICD-10-CM | POA: Insufficient documentation

## 2012-07-11 DIAGNOSIS — E7800 Pure hypercholesterolemia, unspecified: Secondary | ICD-10-CM | POA: Insufficient documentation

## 2012-07-11 DIAGNOSIS — R1013 Epigastric pain: Secondary | ICD-10-CM | POA: Insufficient documentation

## 2012-07-11 LAB — URINE MICROSCOPIC-ADD ON

## 2012-07-11 LAB — BASIC METABOLIC PANEL
BUN: 25 mg/dL — ABNORMAL HIGH (ref 6–23)
CO2: 23 mEq/L (ref 19–32)
Calcium: 9.2 mg/dL (ref 8.4–10.5)
Chloride: 100 mEq/L (ref 96–112)
Creatinine, Ser: 1.21 mg/dL (ref 0.50–1.35)
GFR calc Af Amer: 76 mL/min — ABNORMAL LOW (ref >90)
GFR calc non Af Amer: 65 mL/min — ABNORMAL LOW (ref >90)
Glucose, Bld: 118 mg/dL — ABNORMAL HIGH (ref 70–99)
Potassium: 3.8 mEq/L (ref 3.5–5.1)
Sodium: 134 mEq/L — ABNORMAL LOW (ref 135–145)

## 2012-07-11 LAB — URINALYSIS, ROUTINE W REFLEX MICROSCOPIC
Bilirubin Urine: NEGATIVE
Glucose, UA: NEGATIVE mg/dL
Ketones, ur: NEGATIVE mg/dL
Leukocytes, UA: NEGATIVE
Nitrite: NEGATIVE
Protein, ur: >300 mg/dL — AB
Specific Gravity, Urine: 1.013 (ref 1.005–1.030)
Urobilinogen, UA: 0.2 mg/dL (ref 0.0–1.0)
pH: 6 (ref 5.0–8.0)

## 2012-07-11 LAB — CBC
HCT: 36.4 % — ABNORMAL LOW (ref 39.0–52.0)
Hemoglobin: 13.5 g/dL (ref 13.0–17.0)
MCH: 27.8 pg (ref 26.0–34.0)
MCHC: 37.1 g/dL — ABNORMAL HIGH (ref 30.0–36.0)
MCV: 74.9 fL — ABNORMAL LOW (ref 78.0–100.0)
Platelets: 173 10*3/uL (ref 150–400)
RBC: 4.86 MIL/uL (ref 4.22–5.81)
RDW: 12.9 % (ref 11.5–15.5)
WBC: 6.7 10*3/uL (ref 4.0–10.5)

## 2012-07-11 LAB — LIPASE, BLOOD: Lipase: 32 U/L (ref 11–59)

## 2012-07-11 LAB — TROPONIN I: Troponin I: <0.3 ng/mL (ref <0.30)

## 2012-07-11 MED ORDER — SODIUM CHLORIDE 0.9 % IV BOLUS (SEPSIS)
1000.0000 mL | Freq: Once | INTRAVENOUS | Status: AC
  Administered 2012-07-11: 1000 mL via INTRAVENOUS

## 2012-07-11 MED ORDER — HYDROMORPHONE HCL PF 1 MG/ML IJ SOLN
1.0000 mg | Freq: Once | INTRAMUSCULAR | Status: AC
  Administered 2012-07-11: 1 mg via INTRAVENOUS
  Filled 2012-07-11: qty 1

## 2012-07-11 MED ORDER — GI COCKTAIL ~~LOC~~
30.0000 mL | Freq: Once | ORAL | Status: AC
  Administered 2012-07-11: 30 mL via ORAL
  Filled 2012-07-11: qty 30

## 2012-07-11 NOTE — ED Provider Notes (Signed)
History     CSN: [id number]  Arrival date & time 07/11/12  1955   First MD Initiated Contact with Patient 07/11/12 2042      Chief Complaint  Patient presents with  . Abdominal Pain  . Chest Pain    (Consider location/radiation/quality/duration/timing/severity/associated sxs/prior treatment) Patient is a 57 y.o. male presenting with abdominal pain and chest pain. The history is provided by the patient. No language interpreter was used.  Abdominal Pain Associated symptoms include abdominal pain and chills. Pertinent negatives include no arthralgias, chest pain, fatigue, fever, headaches, nausea, numbness, sore throat, vomiting or weakness.  Chest Pain Associated symptoms: abdominal pain   Associated symptoms: no back pain, no dizziness, no dysphagia, no fatigue, no fever, no headache, no nausea, no numbness, no shortness of breath, not vomiting and no weakness   Barry Lopez is a 57 y/o M with PMHx of DM, HTN, hypercholesterolemia, bowel surgery presenting to the ED with epigastric pain starting suddenly at 4:00PM this after, described as a constant, sharp pain that is "unbearable" - as per patient - that has gotten progressively worse without radiation. patient reported that he took some pepto-bismol at 5:30-6:00PM without relief. Stated that he had acute pancreatitis in January 2014 - stated that he was admitted in [facility] - stated that the symptoms he are presenting with today are the same symptoms that he had when diagnosed with acute pancreatitis. Associated symptoms are chills. Denied nausea, vomiting, diarrhea, melena, hematochezia, chest pain, shortness of breath, difficulty breathing, fever.    Past Medical History  Diagnosis Date  . Diabetes mellitus   . Hypertension   . Hypercholesteremia   . Degenerative disc disease   . Degenerative cervical disc     Past Surgical History  Procedure Laterality Date  . Bowel resection    . Neck surgery    . Tonsillectomy    .  Hernia repair      YEARS AGO  . Cholecystectomy    . Anterior cervical decomp/discectomy fusion  12/17/2011    Procedure: ANTERIOR CERVICAL DECOMPRESSION/DISCECTOMY FUSION 1 LEVEL;  Surgeon: Erline Levine, MD;  Location: [facility];  Service: Neurosurgery;  Laterality: N/A;  Exploration of Fusion and Anterior Cervical Six-Seven Decompression/Diskectomy/Fusion  . Posterior cervical fusion/foraminotomy  12/17/2011    Procedure: POSTERIOR CERVICAL FUSION/FORAMINOTOMY LEVEL 4;  Surgeon: Erline Levine, MD;  Location: [facility];  Service: Neurosurgery;;  Posterior Cervical Three-Seven Fusion    Family History  Problem Relation Age of Onset  . Heart failure Mother   . Diabetes Mellitus II Mother   . Diabetes Mellitus II Sister     History  Substance Use Topics  . Smoking status: Never Smoker   . Smokeless tobacco: Never Used  . Alcohol Use: No      Review of Systems  Constitutional: Positive for chills. Negative for fever and fatigue.  HENT: Negative for sore throat and trouble swallowing.   Eyes: Negative for pain and visual disturbance.  Respiratory: Negative for chest tightness and shortness of breath.   Cardiovascular: Negative for chest pain.  Gastrointestinal: Positive for abdominal pain. Negative for nausea, vomiting, diarrhea, constipation, blood in stool and anal bleeding.  Genitourinary: Negative for dysuria and difficulty urinating.  Musculoskeletal: Negative for back pain and arthralgias.  Neurological: Negative for dizziness, weakness, light-headedness, numbness and headaches.  All other systems reviewed and are negative.    Allergies  Penicillins  Home Medications   Current Outpatient Rx  Name  Route  Sig  Dispense  Refill  . diazepam (VALIUM) 5 MG tablet   Oral   Take 5 mg by mouth every 6 (six) hours as needed (for muscle spasm).         [name] gemfibrozil (LOPID) 600 MG tablet   Oral   Take 600 mg by mouth daily.         . metFORMIN (GLUCOPHAGE) 500 MG  tablet   Oral   Take 500 mg by mouth 2 (two) times daily with a meal.         . Vitamin D, Ergocalciferol, (DRISDOL) 50000 UNITS CAPS   Oral   Take 50,000 Units by mouth every 7 (seven) days. Sundays         . famotidine (PEPCID) 20 MG tablet   Oral   Take 1 tablet (20 mg total) by mouth 2 (two) times daily.   30 tablet   0     BP 141/78  Pulse 81  Temp(Src) 98.2 F (36.8 C) (Oral)  Resp 20  SpO2 99%  Physical Exam  Nursing note and vitals reviewed. Constitutional: He is oriented to person, place, and time. He appears well-developed and well-nourished. No distress.  HENT:  Head: Normocephalic and atraumatic.  Eyes: Conjunctivae and EOM are normal. Pupils are equal, round, and reactive to light. Right eye exhibits no discharge. Left eye exhibits no discharge.  Neck: Normal range of motion. Neck supple. No tracheal deviation present.  Cardiovascular: Normal rate, regular rhythm and normal heart sounds.  Exam reveals no friction rub.   No murmur heard. Pulses:      Radial pulses are 2+ on the right side, and 2+ on the left side.       Dorsalis pedis pulses are 2+ on the right side, and 2+ on the left side.  Pulmonary/Chest: Effort normal and breath sounds normal. No respiratory distress. He has no wheezes. He has no rales.  Abdominal: Soft. Bowel sounds are normal. He exhibits distension. He exhibits no mass. There is no hepatosplenomegaly. There is tenderness in the epigastric area. There is no rebound, no guarding and negative Murphy's sign. No hernia.    Palpation to the lower abdomen - referred pain to the epigastric region.   Lymphadenopathy:    He has no cervical adenopathy.  Neurological: He is alert and oriented to person, place, and time. No cranial nerve deficit. He exhibits normal muscle tone. Coordination normal.  Skin: Skin is warm and dry. No rash noted. He is not diaphoretic. No erythema.  Psychiatric: He has a normal mood and affect. His behavior is  normal. Thought content normal.    ED Course  Procedures (including critical care time)  Reviewed patient's chart - patient was admitted to [facility] on 03/27/2012 - was treated for acute pancreatitis.    Date: 07/11/2012  Rate: 86  Rhythm: normal sinus rhythm  QRS Axis: normal  Intervals: normal  ST/T Wave abnormalities: nonspecific T wave changes  Conduction Disutrbances:none  Narrative Interpretation: right atrial abnormality noted, baseline wander in lead V6  Old EKG Reviewed: unchanged    Labs Reviewed  CBC - Abnormal; Notable for the following:    HCT 36.4 (*)    MCV 74.9 (*)    MCHC 37.1 (*)    All other components within normal limits  BASIC METABOLIC PANEL - Abnormal; Notable for the following:    Sodium 134 (*)    Glucose, Bld 118 (*)    BUN 25 (*)    GFR calc non Af Amer 65 (*)  GFR calc Af Amer 76 (*)    All other components within normal limits  URINALYSIS, ROUTINE W REFLEX MICROSCOPIC - Abnormal; Notable for the following:    Hgb urine dipstick MODERATE (*)    Protein, ur >300 (*)    All other components within normal limits  URINE MICROSCOPIC-ADD ON - Abnormal; Notable for the following:    Casts HYALINE CASTS (*)    All other components within normal limits  LIPASE, BLOOD  TROPONIN I   US Abdomen Complete  07/11/2012   *RADIOLOGY REPORT*  Clinical Data:  Abdominal pain.  COMPLETE ABDOMINAL ULTRASOUND  Comparison:  CT of the abdomen and pelvis 03/27/2012.  Abdominal ultrasound 08/18/2009.  Findings:  Gallbladder:  Status post cholecystectomy.  Common bile duct:  Measures up to 7.8 mm within the porta hepatis (within normal limits for this postcholecystectomy patient).  Liver:  Mild prominence of the intrahepatic biliary tree, which appears be chronic and unchanged compared to prior CT scan 03/27/2012.  No focal cystic or solid hepatic lesions.  Normal hepaopetal flow within the portal vein.  Hepatic parenchyma is generally normal echotexture.  IVC:  Patent  throughout its visualized course in the abdomen.  Pancreas:  In the head of the pancreas there is an ovoid shaped 1.3 x 0.9 x 1.6 cm hypoechoic area which is similar to the prior ultrasound examination 08/18/2009.  Notably, no focal lesion was identified within this region on prior CT scan 03/27/2012.  The remainder of the pancreas is otherwise unremarkable in appearance.  Spleen:  Normal size and echotexture without focal parenchymal abnormality.9.6 cm in length.  Right Kidney:  No hydronephrosis.  Well-preserved cortex.  Normal size and parenchymal echotexture without focal abnormalities. 11.2 cm in length.  Left Kidney:  No hydronephrosis.  Well-preserved cortex.  Normal size and parenchymal echotexture without focal abnormalities. 13.9 cm in length.  Abdominal aorta:  Measures up to 2.2 cm in diameter proximally, tapers appropriately distally.  IMPRESSION: 1.  No acute findings to account for the patient's symptoms. 2.  Status post cholecystectomy. 3.  Slight intrahepatic biliary ductal dilatation appears to be chronic in this postcholecystectomy patient and is unchanged compared to prior CT scan 03/27/2012. 4.  1.3 x 0.9 x 1.6 cm subtle hypoechoic area in the pancreatic head appears unchanged compared to remote prior ultrasound examination 08/18/2009, and had no corollary on the prior CT scan and 03/27/2012.  This is of uncertain etiology and significance, but is favored to be a benign finding, potentially an intrapancreatic lymph node.   Original Report Authenticated By: Vinnie Langton, M.D.     1. Abdominal pain   2. HTN (hypertension)   3. DM (diabetes mellitus)   4. Hypercholesteremia       MDM  Patient stable, afebrile. Urine increased protein > 300 - trend seen continuously. Lowered sodium - patient placed on fluids. EKG and troponins negative findings. Negative elevation in lipase - patient presented with pain within 24 hours - negative elevation in levels noted. Dr. Ascencion Dike saw and  assessed patient - cleared patient for discharge with suspicion of gastritis. Pain controlled in ED setting. Patient to be discharged with suspicion of gastritis - referred patient to PCP and GI - recommended patient to be seen by GI within the next 24-48 hours. Recommended patient to get kidney functioning rechecked - increased BUN levels. Discussed with patient to decrease fatty food intake. Discussed with patient to rest and stay hydrated. Discussed with patient that if pain does not get better  within 24 hours to return to the ED. Patient agreed to plan of care, understood, all questions answered.          Jamse Mead, PA-C 07/12/12 [place], PA-C 07/12/12 [phone]

## 2012-07-11 NOTE — ED Notes (Addendum)
Pt c/o RUQ abd pain, 10/10.  Pt has hx of pancreatitis and reports this is how it felt before. Pt denies n/v/d or chest pain.

## 2012-07-12 MED ORDER — FAMOTIDINE 20 MG PO TABS: 20.0000 mg | ORAL_TABLET | Freq: Two times a day (BID) | ORAL | Status: DC

## 2012-07-12 MED ORDER — GI COCKTAIL ~~LOC~~
30.0000 mL | Freq: Once | ORAL | Status: AC
  Administered 2012-07-12: 30 mL via ORAL
  Filled 2012-07-12: qty 30

## 2012-07-12 NOTE — ED Provider Notes (Signed)
Medical screening examination/treatment/procedure(s) were performed by non-physician practitioner and as supervising physician I was immediately available for consultation/collaboration.   Julianne Rice, MD 07/12/12 [phone]

## 2013-06-26 ENCOUNTER — Emergency Department (HOSPITAL_COMMUNITY)
Admission: EM | Admit: 2013-06-26 | Discharge: 2013-06-26 | Disposition: A | Discharge status: Home / Self Care | Payer: BC Managed Care – PPO | Attending: Emergency Medicine | Admitting: Emergency Medicine

## 2013-06-26 ENCOUNTER — Encounter (HOSPITAL_COMMUNITY): Payer: Self-pay | Admitting: Emergency Medicine

## 2013-06-26 DIAGNOSIS — Z880 Allergy status to penicillin: Secondary | ICD-10-CM | POA: Insufficient documentation

## 2013-06-26 DIAGNOSIS — Y92009 Unspecified place in unspecified non-institutional (private) residence as the place of occurrence of the external cause: Secondary | ICD-10-CM | POA: Insufficient documentation

## 2013-06-26 DIAGNOSIS — Z8739 Personal history of other diseases of the musculoskeletal system and connective tissue: Secondary | ICD-10-CM | POA: Insufficient documentation

## 2013-06-26 DIAGNOSIS — I10 Essential (primary) hypertension: Secondary | ICD-10-CM | POA: Insufficient documentation

## 2013-06-26 DIAGNOSIS — T25219A Burn of second degree of unspecified ankle, initial encounter: Secondary | ICD-10-CM | POA: Insufficient documentation

## 2013-06-26 DIAGNOSIS — Z79899 Other long term (current) drug therapy: Secondary | ICD-10-CM | POA: Insufficient documentation

## 2013-06-26 DIAGNOSIS — E7800 Pure hypercholesterolemia, unspecified: Secondary | ICD-10-CM | POA: Insufficient documentation

## 2013-06-26 DIAGNOSIS — X131XXA Other contact with steam and other hot vapors, initial encounter: Secondary | ICD-10-CM

## 2013-06-26 DIAGNOSIS — E119 Type 2 diabetes mellitus without complications: Secondary | ICD-10-CM | POA: Insufficient documentation

## 2013-06-26 DIAGNOSIS — X12XXXA Contact with other hot fluids, initial encounter: Secondary | ICD-10-CM | POA: Insufficient documentation

## 2013-06-26 DIAGNOSIS — T25019A Burn of unspecified degree of unspecified ankle, initial encounter: Secondary | ICD-10-CM

## 2013-06-26 DIAGNOSIS — Y93G9 Activity, other involving cooking and grilling: Secondary | ICD-10-CM | POA: Insufficient documentation

## 2013-06-26 MED ORDER — TETANUS-DIPHTH-ACELL PERTUSSIS 5-2.5-18.5 LF-MCG/0.5 IM SUSP
0.5000 mL | Freq: Once | INTRAMUSCULAR | Status: AC
  Administered 2013-06-26: 0.5 mL via INTRAMUSCULAR
  Filled 2013-06-26: qty 0.5

## 2013-06-26 MED ORDER — SILVER SULFADIAZINE 1 % EX CREA
TOPICAL_CREAM | Freq: Once | CUTANEOUS | Status: AC
  Administered 2013-06-26: 1 via TOPICAL
  Filled 2013-06-26: qty 50

## 2013-06-26 NOTE — Discharge Instructions (Signed)
Change dressings twice daily with Silvadene.  Ibuprofen 600 mg every 6 hours as needed for pain.  Return to the emergency department if you develop pus draining from the burns, red streaks up the legs, or increased pain.   Burn Care Your skin is a natural barrier to infection. It is the largest organ of your body. Burns damage this natural protection. To help prevent infection, it is very important to follow your caregiver's instructions in the care of your burn. Burns are classified as:  First degree. There is only redness of the skin (erythema). No scarring is expected.  Second degree. There is blistering of the skin. Scarring may occur with deeper burns.  Third degree. All layers of the skin are injured, and scarring is expected. HOME CARE INSTRUCTIONS   Wash your hands well before changing your bandage.  Change your bandage as often as directed by your caregiver.  Remove the old bandage. If the bandage sticks, you may soak it off with cool, clean water.  Cleanse the burn thoroughly but gently with mild soap and water.  Pat the area dry with a clean, dry cloth.  Apply a thin layer of antibacterial cream to the burn.  Apply a clean bandage as instructed by your caregiver.  Keep the bandage as clean and dry as possible.  Elevate the affected area for the first 24 hours, then as instructed by your caregiver.  Only take over-the-counter or prescription medicines for pain, discomfort, or fever as directed by your caregiver. SEEK IMMEDIATE MEDICAL CARE IF:   You develop excessive pain.  You develop redness, tenderness, swelling, or red streaks near the burn.  The burned area develops yellowish-white fluid (pus) or a bad smell.  You have a fever. MAKE SURE YOU:   Understand these instructions.  Will watch your condition.  Will get help right away if you are not doing well or get worse. Document Released: 01/25/2005 Document Revised: 04/19/2011 Document Reviewed:  06/17/2010 [facility] Patient Information 2014 [place], [place].
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
## 2013-06-26 NOTE — ED Notes (Signed)
Pt educated on wound care and precautions, verbalized undersanding

## 2013-08-08 HISTORY — PX: TRANSTHORACIC ECHOCARDIOGRAM: SHX275

## 2013-09-04 ENCOUNTER — Emergency Department (HOSPITAL_COMMUNITY): Payer: BC Managed Care – PPO

## 2013-09-04 ENCOUNTER — Inpatient Hospital Stay (HOSPITAL_COMMUNITY)
Admission: EM | Admit: 2013-09-04 | Discharge: 2013-09-07 | DRG: 286 | Disposition: A | Discharge status: Home / Self Care | Payer: BC Managed Care – PPO | Attending: Internal Medicine | Admitting: Internal Medicine

## 2013-09-04 ENCOUNTER — Encounter (HOSPITAL_COMMUNITY): Payer: Self-pay | Admitting: Emergency Medicine

## 2013-09-04 DIAGNOSIS — Z833 Family history of diabetes mellitus: Secondary | ICD-10-CM

## 2013-09-04 DIAGNOSIS — I2510 Atherosclerotic heart disease of native coronary artery without angina pectoris: Secondary | ICD-10-CM | POA: Diagnosis present

## 2013-09-04 DIAGNOSIS — R509 Fever, unspecified: Secondary | ICD-10-CM

## 2013-09-04 DIAGNOSIS — I129 Hypertensive chronic kidney disease with stage 1 through stage 4 chronic kidney disease, or unspecified chronic kidney disease: Secondary | ICD-10-CM | POA: Diagnosis present

## 2013-09-04 DIAGNOSIS — I498 Other specified cardiac arrhythmias: Secondary | ICD-10-CM | POA: Diagnosis not present

## 2013-09-04 DIAGNOSIS — G609 Hereditary and idiopathic neuropathy, unspecified: Secondary | ICD-10-CM | POA: Diagnosis present

## 2013-09-04 DIAGNOSIS — Z0189 Encounter for other specified special examinations: Secondary | ICD-10-CM

## 2013-09-04 DIAGNOSIS — I5031 Acute diastolic (congestive) heart failure: Secondary | ICD-10-CM

## 2013-09-04 DIAGNOSIS — E785 Hyperlipidemia, unspecified: Secondary | ICD-10-CM

## 2013-09-04 DIAGNOSIS — E7800 Pure hypercholesterolemia, unspecified: Secondary | ICD-10-CM | POA: Diagnosis present

## 2013-09-04 DIAGNOSIS — E118 Type 2 diabetes mellitus with unspecified complications: Secondary | ICD-10-CM

## 2013-09-04 DIAGNOSIS — N183 Chronic kidney disease, stage 3 unspecified: Secondary | ICD-10-CM

## 2013-09-04 DIAGNOSIS — N179 Acute kidney failure, unspecified: Secondary | ICD-10-CM

## 2013-09-04 DIAGNOSIS — I10 Essential (primary) hypertension: Secondary | ICD-10-CM

## 2013-09-04 DIAGNOSIS — I200 Unstable angina: Secondary | ICD-10-CM

## 2013-09-04 DIAGNOSIS — I509 Heart failure, unspecified: Secondary | ICD-10-CM

## 2013-09-04 DIAGNOSIS — N189 Chronic kidney disease, unspecified: Secondary | ICD-10-CM

## 2013-09-04 DIAGNOSIS — R001 Bradycardia, unspecified: Secondary | ICD-10-CM

## 2013-09-04 DIAGNOSIS — Z981 Arthrodesis status: Secondary | ICD-10-CM

## 2013-09-04 DIAGNOSIS — I5032 Chronic diastolic (congestive) heart failure: Secondary | ICD-10-CM | POA: Diagnosis present

## 2013-09-04 DIAGNOSIS — I517 Cardiomegaly: Secondary | ICD-10-CM

## 2013-09-04 DIAGNOSIS — E088 Diabetes mellitus due to underlying condition with unspecified complications: Secondary | ICD-10-CM

## 2013-09-04 DIAGNOSIS — N058 Unspecified nephritic syndrome with other morphologic changes: Secondary | ICD-10-CM | POA: Diagnosis present

## 2013-09-04 DIAGNOSIS — E1129 Type 2 diabetes mellitus with other diabetic kidney complication: Secondary | ICD-10-CM | POA: Diagnosis present

## 2013-09-04 DIAGNOSIS — M7989 Other specified soft tissue disorders: Secondary | ICD-10-CM

## 2013-09-04 DIAGNOSIS — M79609 Pain in unspecified limb: Secondary | ICD-10-CM

## 2013-09-04 DIAGNOSIS — R079 Chest pain, unspecified: Secondary | ICD-10-CM

## 2013-09-04 DIAGNOSIS — I4891 Unspecified atrial fibrillation: Principal | ICD-10-CM

## 2013-09-04 DIAGNOSIS — E119 Type 2 diabetes mellitus without complications: Secondary | ICD-10-CM

## 2013-09-04 HISTORY — DX: Paroxysmal atrial fibrillation: I48.0

## 2013-09-04 HISTORY — DX: Chronic diastolic (congestive) heart failure: I50.32

## 2013-09-04 LAB — URINALYSIS, ROUTINE W REFLEX MICROSCOPIC
Bilirubin Urine: NEGATIVE
Glucose, UA: NEGATIVE mg/dL
Ketones, ur: NEGATIVE mg/dL
Leukocytes, UA: NEGATIVE
Nitrite: NEGATIVE
Protein, ur: >300 mg/dL — AB
Specific Gravity, Urine: 1.018 (ref 1.005–1.030)
Urobilinogen, UA: 0.2 mg/dL (ref 0.0–1.0)
pH: 5 (ref 5.0–8.0)

## 2013-09-04 LAB — BASIC METABOLIC PANEL
Anion gap: 13 (ref 5–15)
BUN: 32 mg/dL — ABNORMAL HIGH (ref 6–23)
CO2: 22 mEq/L (ref 19–32)
Calcium: 9 mg/dL (ref 8.4–10.5)
Chloride: 105 mEq/L (ref 96–112)
Creatinine, Ser: 1.8 mg/dL — ABNORMAL HIGH (ref 0.50–1.35)
GFR calc Af Amer: 46 mL/min — ABNORMAL LOW (ref >90)
GFR calc non Af Amer: 40 mL/min — ABNORMAL LOW (ref >90)
Glucose, Bld: 170 mg/dL — ABNORMAL HIGH (ref 70–99)
Potassium: 4.2 mEq/L (ref 3.7–5.3)
Sodium: 140 mEq/L (ref 137–147)

## 2013-09-04 LAB — CBC
HCT: 36.6 % — ABNORMAL LOW (ref 39.0–52.0)
Hemoglobin: 13.2 g/dL (ref 13.0–17.0)
MCH: 27.8 pg (ref 26.0–34.0)
MCHC: 36.1 g/dL — ABNORMAL HIGH (ref 30.0–36.0)
MCV: 77.1 fL — ABNORMAL LOW (ref 78.0–100.0)
Platelets: 172 10*3/uL (ref 150–400)
RBC: 4.75 MIL/uL (ref 4.22–5.81)
RDW: 13 % (ref 11.5–15.5)
WBC: 6.4 10*3/uL (ref 4.0–10.5)

## 2013-09-04 LAB — URINE MICROSCOPIC-ADD ON

## 2013-09-04 LAB — GLUCOSE, CAPILLARY
Glucose-Capillary: 197 mg/dL — ABNORMAL HIGH (ref 70–99)
Glucose-Capillary: 166 mg/dL — ABNORMAL HIGH (ref 70–99)
Glucose-Capillary: 244 mg/dL — ABNORMAL HIGH (ref 70–99)

## 2013-09-04 LAB — PRO B NATRIURETIC PEPTIDE: Pro B Natriuretic peptide (BNP): 1304 pg/mL — ABNORMAL HIGH (ref 0–125)

## 2013-09-04 LAB — TROPONIN I
Troponin I: <0.3 ng/mL (ref <0.30)
Troponin I: <0.3 ng/mL (ref <0.30)
Troponin I: <0.3 ng/mL (ref <0.30)

## 2013-09-04 LAB — TSH: TSH: 2.58 u[IU]/mL (ref 0.350–4.500)

## 2013-09-04 LAB — I-STAT TROPONIN, ED: Troponin i, poc: 0.01 ng/mL (ref 0.00–0.08)

## 2013-09-04 LAB — D-DIMER, QUANTITATIVE: D-Dimer, Quant: 0.65 ug/mL-FEU — ABNORMAL HIGH (ref 0.00–0.48)

## 2013-09-04 LAB — MRSA PCR SCREENING: MRSA by PCR: NEGATIVE

## 2013-09-04 LAB — HEMOGLOBIN A1C
Hgb A1c MFr Bld: 6.5 % — ABNORMAL HIGH (ref <5.7)
Mean Plasma Glucose: 140 mg/dL — ABNORMAL HIGH (ref <117)

## 2013-09-04 LAB — HEPARIN LEVEL (UNFRACTIONATED): Heparin Unfractionated: 0.53 IU/mL (ref 0.30–0.70)

## 2013-09-04 MED ORDER — HEPARIN SODIUM (PORCINE) 5000 UNIT/ML IJ SOLN
5000.0000 [IU] | Freq: Three times a day (TID) | INTRAMUSCULAR | Status: DC
  Administered 2013-09-04: 5000 [IU] via SUBCUTANEOUS
  Filled 2013-09-04: qty 1

## 2013-09-04 MED ORDER — NITROGLYCERIN 0.4 MG SL SUBL
0.4000 mg | SUBLINGUAL_TABLET | SUBLINGUAL | Status: AC | PRN
  Administered 2013-09-04 (×3): 0.4 mg via SUBLINGUAL
  Filled 2013-09-04: qty 1

## 2013-09-04 MED ORDER — HEPARIN BOLUS VIA INFUSION
4000.0000 [IU] | Freq: Once | INTRAVENOUS | Status: AC
  Administered 2013-09-04: 4000 [IU] via INTRAVENOUS
  Filled 2013-09-04: qty 4000

## 2013-09-04 MED ORDER — ASPIRIN EC 81 MG PO TBEC
81.0000 mg | DELAYED_RELEASE_TABLET | Freq: Every day | ORAL | Status: DC
  Administered 2013-09-04 – 2013-09-07 (×3): 81 mg via ORAL
  Filled 2013-09-04 (×3): qty 1

## 2013-09-04 MED ORDER — MORPHINE SULFATE 2 MG/ML IJ SOLN
2.0000 mg | INTRAMUSCULAR | Status: DC | PRN
  Administered 2013-09-04: 2 mg via INTRAVENOUS
  Filled 2013-09-04: qty 1

## 2013-09-04 MED ORDER — ASPIRIN 81 MG PO CHEW
324.0000 mg | CHEWABLE_TABLET | Freq: Once | ORAL | Status: AC
  Administered 2013-09-04: 324 mg via ORAL
  Filled 2013-09-04: qty 4

## 2013-09-04 MED ORDER — ALBUTEROL SULFATE (2.5 MG/3ML) 0.083% IN NEBU
5.0000 mg | INHALATION_SOLUTION | Freq: Once | RESPIRATORY_TRACT | Status: AC
  Administered 2013-09-04: 5 mg via RESPIRATORY_TRACT
  Filled 2013-09-04: qty 6

## 2013-09-04 MED ORDER — ONDANSETRON HCL 4 MG/2ML IJ SOLN
4.0000 mg | Freq: Four times a day (QID) | INTRAMUSCULAR | Status: DC | PRN
  Administered 2013-09-04: 4 mg via INTRAVENOUS
  Filled 2013-09-04: qty 2

## 2013-09-04 MED ORDER — METOPROLOL TARTRATE 25 MG PO TABS
25.0000 mg | ORAL_TABLET | Freq: Two times a day (BID) | ORAL | Status: DC
  Administered 2013-09-04: 25 mg via ORAL
  Filled 2013-09-04: qty 1

## 2013-09-04 MED ORDER — FUROSEMIDE 10 MG/ML IJ SOLN: 40.0000 mg | Freq: Two times a day (BID) | INTRAMUSCULAR | Status: DC

## 2013-09-04 MED ORDER — ACETAMINOPHEN 325 MG PO TABS: 650.0000 mg | ORAL_TABLET | Freq: Four times a day (QID) | ORAL | Status: DC | PRN

## 2013-09-04 MED ORDER — ASPIRIN EC 325 MG PO TBEC: 325.0000 mg | DELAYED_RELEASE_TABLET | Freq: Every day | ORAL | Status: DC

## 2013-09-04 MED ORDER — OXYCODONE-ACETAMINOPHEN 5-325 MG PO TABS
2.0000 | ORAL_TABLET | Freq: Once | ORAL | Status: AC
  Administered 2013-09-04: 2 via ORAL
  Filled 2013-09-04: qty 2

## 2013-09-04 MED ORDER — INSULIN ASPART 100 UNIT/ML ~~LOC~~ SOLN
0.0000 [IU] | Freq: Three times a day (TID) | SUBCUTANEOUS | Status: DC
  Administered 2013-09-04 – 2013-09-05 (×4): 3 [IU] via SUBCUTANEOUS
  Administered 2013-09-06: 2 [IU] via SUBCUTANEOUS

## 2013-09-04 MED ORDER — ACETAMINOPHEN 325 MG PO TABS: 650.0000 mg | ORAL_TABLET | ORAL | Status: DC | PRN

## 2013-09-04 MED ORDER — HYDRALAZINE HCL 20 MG/ML IJ SOLN: 10.0000 mg | Freq: Four times a day (QID) | INTRAMUSCULAR | Status: DC | PRN

## 2013-09-04 MED ORDER — DILTIAZEM HCL 25 MG/5ML IV SOLN
10.0000 mg | Freq: Four times a day (QID) | INTRAVENOUS | Status: DC | PRN
  Filled 2013-09-04: qty 5

## 2013-09-04 MED ORDER — HEPARIN (PORCINE) IN NACL 100-0.45 UNIT/ML-% IJ SOLN
1050.0000 [IU]/h | INTRAMUSCULAR | Status: DC
  Administered 2013-09-04: 1250 [IU]/h via INTRAVENOUS
  Administered 2013-09-06: 1050 [IU]/h via INTRAVENOUS
  Filled 2013-09-04 (×4): qty 250

## 2013-09-04 MED ORDER — ATORVASTATIN CALCIUM 40 MG PO TABS
40.0000 mg | ORAL_TABLET | Freq: Every day | ORAL | Status: DC
  Administered 2013-09-04 – 2013-09-05 (×2): 40 mg via ORAL
  Filled 2013-09-04 (×4): qty 1

## 2013-09-04 MED ORDER — CARVEDILOL 3.125 MG PO TABS
3.1250 mg | ORAL_TABLET | Freq: Two times a day (BID) | ORAL | Status: DC
  Administered 2013-09-04 – 2013-09-07 (×4): 3.125 mg via ORAL
  Filled 2013-09-04 (×7): qty 1

## 2013-09-04 MED ORDER — INSULIN ASPART 100 UNIT/ML ~~LOC~~ SOLN
0.0000 [IU] | Freq: Every day | SUBCUTANEOUS | Status: DC
  Administered 2013-09-04: 2 [IU] via SUBCUTANEOUS

## 2013-09-04 MED ORDER — AMLODIPINE BESYLATE 5 MG PO TABS
5.0000 mg | ORAL_TABLET | Freq: Every day | ORAL | Status: DC
  Administered 2013-09-04: 5 mg via ORAL
  Filled 2013-09-04: qty 1

## 2013-09-04 MED ORDER — NITROGLYCERIN 2 % TD OINT
1.0000 [in_us] | TOPICAL_OINTMENT | Freq: Four times a day (QID) | TRANSDERMAL | Status: DC
  Administered 2013-09-04 – 2013-09-06 (×8): 1 [in_us] via TOPICAL
  Filled 2013-09-04 (×2): qty 30

## 2013-09-04 MED ORDER — FUROSEMIDE 10 MG/ML IJ SOLN
40.0000 mg | Freq: Once | INTRAMUSCULAR | Status: AC
  Administered 2013-09-04: 40 mg via INTRAVENOUS
  Filled 2013-09-04: qty 4

## 2013-09-04 MED ORDER — PREGABALIN 25 MG PO CAPS
100.0000 mg | ORAL_CAPSULE | Freq: Two times a day (BID) | ORAL | Status: DC
  Administered 2013-09-06: 100 mg via ORAL
  Filled 2013-09-04: qty 2
  Filled 2013-09-04: qty 1
  Filled 2013-09-04: qty 4
  Filled 2013-09-04: qty 1

## 2013-09-04 MED ORDER — AMLODIPINE BESYLATE 10 MG PO TABS: 10.0000 mg | ORAL_TABLET | Freq: Every day | ORAL | Status: DC

## 2013-09-04 MED ORDER — INSULIN ASPART 100 UNIT/ML ~~LOC~~ SOLN: 0.0000 [IU] | Freq: Four times a day (QID) | SUBCUTANEOUS | Status: DC

## 2013-09-04 NOTE — Progress Notes (Signed)
Spoke with PCP on the phone. Last blood work done at PCP office was in 2013. Last renal function in the system is one year back which was normal. Unclear if patient has acute kidney injury versus CKD due to diabetic nephropathy given finding of proteinuria. Will monitor off Lasix.  A1C is reassuring

## 2013-09-04 NOTE — ED Notes (Addendum)
Patient c/o SOB when laying flat. Patient states onset was @2  weeks ago. Patient states his chest feels tight when he wakes up. Patient denies any changes which triggered his visit tonight. Patient also reports his feet have been swelling for @ 1 month.

## 2013-09-04 NOTE — H&P (Signed)
[facility] History and Physical  Patient: Barry Lopez  [id number]  DOB: 09-03-1955  DOS: the patient was seen and examined on 09/04/2013 PCP: Philis Fendt, MD  Chief Complaint: Chest tightness  HPI: Barry Lopez is a 58 y.o. male with Past medical history of diabetes, hypertension, dyslipidemia, pancreatitis. Patient presented with complaints of 2 weeks of orthopnea associated with PND. He mentions his symptoms have been progressively getting worse. He also started having bilateral lower extremity swelling during same time. He denies any recent travel or immobilization or recent surgery. He denies any significant family history of coronary artery disease. He denies any similar symptoms in the past of stress test or any cardiac workup in the past. He denies any smoking history or alcohol abuse. He denies any recent change in his medication. He denies any fever, chills, cough, nausea, vomiting, abdominal pain, acid reflux, diarrhea, decreased urination. Last night when he was sleeping he started having substernal chest tightness which has not improved until now this was associated with shortness of breath and choking sensation.  The patient is coming from home. And at his baseline independent for most of his ADL.  Review of Systems: as mentioned in the history of present illness.  A Comprehensive review of the other systems is negative.  Past Medical History  Diagnosis Date  . Diabetes mellitus   . Hypertension   . Hypercholesteremia   . Degenerative disc disease   . Degenerative cervical disc    Past Surgical History  Procedure Laterality Date  . Bowel resection    . Neck surgery    . Tonsillectomy    . Hernia repair      YEARS AGO  . Cholecystectomy    . Anterior cervical decomp/discectomy fusion  12/17/2011    Procedure: ANTERIOR CERVICAL DECOMPRESSION/DISCECTOMY FUSION 1 LEVEL;  Surgeon: Erline Levine, MD;  Location: [facility] [place] [facility];  Service: Neurosurgery;   Laterality: N/A;  Exploration of Fusion and Anterior Cervical Six-Seven Decompression/Diskectomy/Fusion  . Posterior cervical fusion/foraminotomy  12/17/2011    Procedure: POSTERIOR CERVICAL FUSION/FORAMINOTOMY LEVEL 4;  Surgeon: Erline Levine, MD;  Location: [facility];  Service: Neurosurgery;;  Posterior Cervical Three-Seven Fusion   Social History:  reports that he has never smoked. He has never used smokeless tobacco. He reports that he does not drink alcohol or use illicit drugs.  Allergies  Allergen Reactions  . Penicillins Hives and Itching    Family History  Problem Relation Age of Onset  . Heart failure Mother   . Diabetes Mellitus II Mother   . Diabetes Mellitus II Sister     Prior to Admission medications   Medication Sig Start Date End Date Taking? Authorizing Provider  amLODipine (NORVASC) 10 MG tablet Take 10 mg by mouth daily.   Yes Historical Provider, MD  glimepiride (AMARYL) 4 MG tablet Take 4 mg by mouth daily with breakfast.   Yes Historical Provider, MD  metFORMIN (GLUCOPHAGE) 500 MG tablet Take 500 mg by mouth 2 (two) times daily with a meal.   Yes Historical Provider, MD  pregabalin (LYRICA) 100 MG capsule Take 100 mg by mouth 2 (two) times daily.   Yes Historical Provider, MD    Physical Exam: Filed Vitals:   09/04/13 0415 09/04/13 0513 09/04/13 0519 09/04/13 0525  BP:  153/74 137/62 135/80  Pulse: 76 100    Temp:      TempSrc:      Resp: 21 20    Height:      Weight:  SpO2: 100% 93%      General: Alert, Awake and Oriented to Time, Place and Person. Appear in mild distress Eyes: PERRL ENT: Oral Mucosa clear moist. Neck: Difficult to assess JVD Cardiovascular: S1 and S2 Present, no Murmur, Peripheral Pulses Present Respiratory: Bilateral Air entry equal and Decreased, bilateral basal Crackles, no wheezes Abdomen: Bowel Sound Present, Soft and Non tender Skin: No Rash Extremities: Bilateral Pedal edema, no calf tenderness Neurologic: Grossly  no focal neuro deficit.  Labs on Admission:  CBC:  Recent Labs Lab 09/04/13 0259  WBC 6.4  HGB 13.2  HCT 36.6*  MCV 77.1*  PLT 172    CMP     Component Value Date/Time   NA 140 09/04/2013 0353   K 4.2 09/04/2013 0353   CL 105 09/04/2013 0353   CO2 22 09/04/2013 0353   GLUCOSE 170* 09/04/2013 0353   BUN 32* 09/04/2013 0353   CREATININE 1.80* 09/04/2013 0353   CALCIUM 9.0 09/04/2013 0353   PROT 6.3 03/29/2012 0445   ALBUMIN 2.4* 03/29/2012 0445   AST 74* 03/29/2012 0445   ALT 61* 03/29/2012 0445   ALKPHOS 62 03/29/2012 0445   BILITOT 0.7 03/29/2012 0445   GFRNONAA 40* 09/04/2013 0353   GFRAA 46* 09/04/2013 0353    No results found for this basename: LIPASE, AMYLASE,  in the last 168 hours No results found for this basename: AMMONIA,  in the last 168 hours  No results found for this basename: CKTOTAL, CKMB, CKMBINDEX, TROPONINI,  in the last 168 hours BNP (last 3 results)  Recent Labs  09/04/13 0259  PROBNP 1304.0*    Radiological Exams on Admission: Dg Chest Port 1 View  09/04/2013   CLINICAL DATA:  Chest pain and shortness of breath.  EXAM: PORTABLE CHEST - 1 VIEW  COMPARISON:  03/27/2012  FINDINGS: Shallow inspiration. Normal heart size and pulmonary vascularity. Suggestion of peribronchial thickening consistent with bronchiolitis or reactive airways disease. No focal consolidation or airspace disease. No blunting of costophrenic angles. No pneumothorax.  IMPRESSION: Bronchitic changes suggesting bronchiolitis or reactive airways disease. No focal consolidation. Normal heart size.   Electronically Signed   By: Lucienne Capers M.D.   On: 09/04/2013 02:54    EKG: Independently reviewed. atrial fibrillation, rate controlled. Assessment/Plan Principal Problem:   New onset atrial fibrillation Active Problems:   Hypertension   Hyperlipidemia   Diabetes mellitus   Chest pain   Acute CHF   Needs sleep apnea assessment   1. New onset atrial fibrillation Patient is  presenting with complaints of orthopnea, PND, chest tightness. His EKG is showing evidence of new-onset A. fib without any ST-T wave changes. He has negative troponins and elevated proBNP. Basal crackles and bilateral leg swelling. With this patient will be admitted to the step down unit. He continues to have chest pain which will be managed with nitroglycerin ointment and IV morphine. 7 follow serial troponin. Limited echocardiogram and telemetry monitoring. Check TSH hemoglobin A1c and d-dimer.  2. Hypertension. Continue amlodipine and add nitroglycerin ointment.  patient may benefit from reducing the dose of amlodipine and switching him to ACE inhibitor or ARB or cardioprotective beta blocker.  3. acute CHF. Patient is basal crackles, elevated proBNP, orthopnea and PND, bilateral leg swelling. I will give him IV Lasix 40 mg x1 and monitor his urine output and ins and outs and daily weight. Complete echocardiogram.  4. diabetes mellitus. Holding oral hypoglycemic medication and placing the patient on sliding scale.  5. Possible sleep apnea. Patient  complains of snoring, waking up with difficulty breathing during the night, daytime sleepiness all the symptoms suggesting possible sleep apnea. His atrial fibrillation and new onset heart failure can also be caused by sleep apnea. Thus he will require sleep apnea evaluation as an outpatient.  DVT Prophylaxis: subcutaneous Heparin Nutrition:  N.p.o. except medication  Code Status:  Full  Disposition: Admitted to inpatient in step-down unit.  Author: Berle Mull, MD [facility] Pager: [phone] 09/04/2013, 7:09 AM    If 7PM-7AM, please contact night-coverage [email or web address] Password [email or web address]  **Disclaimer: This note may have been dictated with voice recognition software. Similar sounding words can inadvertently be transcribed and this note may contain transcription errors which may not have been corrected upon publication of  note.**

## 2013-09-04 NOTE — Consult Note (Addendum)
CARDIOLOGY CONSULT NOTE   Patient ID: Barry Lopez MRN: [id number], DOB/AGE: [birth year]   Admit date: 09/04/2013 Date of Consult: 09/04/2013   Primary Physician: Philis Fendt, MD Primary Cardiologist: New  Pt. Profile   58 year old [place] American male with past medical history of hypertension, diabetes, dyslipidemia, history of pancreatitis and possible obstructive sleep apnea presented with chest pain with exertion, new onset a-fib, and CHF.  Problem List  Past Medical History  Diagnosis Date  . Diabetes mellitus   . Hypertension   . Hypercholesteremia   . Degenerative disc disease   . Degenerative cervical disc     Past Surgical History  Procedure Laterality Date  . Bowel resection    . Neck surgery    . Tonsillectomy    . Hernia repair      YEARS AGO  . Cholecystectomy    . Anterior cervical decomp/discectomy fusion  12/17/2011    Procedure: ANTERIOR CERVICAL DECOMPRESSION/DISCECTOMY FUSION 1 LEVEL;  Surgeon: Erline Levine, MD;  Location: [place] [facility];  Service: Neurosurgery;  Laterality: N/A;  Exploration of Fusion and Anterior Cervical Six-Seven Decompression/Diskectomy/Fusion  . Posterior cervical fusion/foraminotomy  12/17/2011    Procedure: POSTERIOR CERVICAL FUSION/FORAMINOTOMY LEVEL 4;  Surgeon: Erline Levine, MD;  Location: [facility];  Service: Neurosurgery;;  Posterior Cervical Three-Seven Fusion     Allergies  Allergies  Allergen Reactions  . Penicillins Hives and Itching    HPI   The patient is a 58 year old African American male with past medical history of hypertension, diabetes, dyslipidemia, history of pancreatitis and possible obstructive sleep apnea. He has never seen a cardiologist before and denies any prior history of cardiac problem. 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 For the past 3 month, patient has been having increase SOB with exertion, some orthopnea and paroxysmal nocturnal  dyspnea. For the past month, he also noted to have increasing lower extremity edema. He states he has been compliant with his medication at home. Two weeks ago, while lifting objects at work, he began to note some chest pain. He described the chest pain as sharp stabbing pain that also have tightness characteristic as well. The chest pain also radiated to the left arm and bilateral lower extremity as well. It has been associated with shortness of breath and dizziness. For the past 2 weeks, the pain recurred several times during work associated with exertion. On Sunday night, patient had recurrence of chest tightness while the patient was laying down sleeping. He woke up feeling short of breath. Patient still decided to go to work on Monday, however has to go home early because of recurrence of chest tightness during work. Monday night, while sleeping, patient had recurrent shortness breath and chest discomfort that woke him up as he felt like he couldn't breathe. He finally decided to seek medical attention [facility].  While in that [facility] ED, it was noted the patient was in atrial fibrillation. However it appears that his heart rate was rate controlled in the 70s even without rate control medication. Significant laboratory findings include Cr 1.8 which increased from 1.2 a year ago. Initial troponin negative. Pro BNP elevated at 1304. D-dimer was elevated at 0.65. Hemoglobin A1c 6.5. TSH normal. CXR shows reactive airway disease. Internal medicine and admitted the patient. Echocardiogram was obtained on 7/28 which showed EF 65%, no regional wall motion abnormality, moderate LVH, mildly dilated left atrium 4.1 cm. Cardiology was consulted for chest pain, A.  fib, and acute diastolic heart failure.   Of note, while I was in the room talking to the patient, his heart rate bradycardia down to the 30s for a few seconds. He also has significant nausea and dizziness at the same time.  Inpatient  Medications  . [START ON 09/05/2013] aspirin EC  325 mg Oral Daily  . furosemide  40 mg Intravenous Q12H  . heparin  5,000 Units Subcutaneous 3 times per day  . insulin aspart  0-15 Units Subcutaneous TID WC  . insulin aspart  0-5 Units Subcutaneous QHS  . metoprolol tartrate  25 mg Oral BID  . nitroGLYCERIN  1 inch Topical 4 times per day  . pregabalin  100 mg Oral BID    Family History Family History  Problem Relation Age of Onset  . Heart failure Mother   . Diabetes Mellitus II Mother   . Diabetes Mellitus II Sister      Social History History   Social History  . Marital Status: Married    Spouse Name: N/A    Number of Children: N/A  . Years of Education: N/A   Occupational History  . Not on file.   Social History Main Topics  . Smoking status: Never Smoker   . Smokeless tobacco: Never Used  . Alcohol Use: No  . Drug Use: No  . Sexual Activity: No   Other Topics Concern  . Not on file   Social History Narrative  . No narrative on file     Review of Systems  General:  No chills, fever, night sweats or weight changes.  Cardiovascular:  + chest pain, dyspnea on exertion, edema, orthopnea, palpitations, paroxysmal nocturnal dyspnea. Dermatological: No rash, lesions/masses Respiratory: No cough, dyspnea Urologic: No hematuria, dysuria Abdominal:   No vomiting, diarrhea, bright red blood per rectum, melena, or hematemesis +nausea Neurologic:  No visual changes, changes in mental status. +weakness All other systems reviewed and are otherwise negative except as noted above.  Physical Exam  Blood pressure 148/86, pulse 68, temperature 97.9 F (36.6 C), temperature source Oral, resp. rate 20, height 5\' 8"  (1.727 m), weight 192 lb 0.3 oz (87.1 kg), SpO2 97.00%.  General: Pleasant, NAD Psych: Normal affect. Neuro: Alert and oriented X 3. Moves all extremities spontaneously. HEENT: Normal  Neck: Supple without bruits. JVP hard to see ~7-8 Lungs:  Resp regular  and unlabored, bibasilar rale Heart: irregular no s3, s4, or murmurs. Abdomen: Soft, non-tender, BS + x 4.  Distended, however nontender Extremities: No clubbing, cyanosis. DP/PT/Radials 2+ and equal bilaterally. No edema in LE  Labs   Recent Labs  09/04/13 0802  TROPONINI <0.30   Lab Results  Component Value Date   WBC 6.4 09/04/2013   HGB 13.2 09/04/2013   HCT 36.6* 09/04/2013   MCV 77.1* 09/04/2013   PLT 172 09/04/2013    Recent Labs Lab 09/04/13 0353  NA 140  K 4.2  CL 105  CO2 22  BUN 32*  CREATININE 1.80*  CALCIUM 9.0  GLUCOSE 170*   Lab Results  Component Value Date   CHOL 154 03/27/2012   HDL 54 03/27/2012   LDLCALC 66 03/27/2012   TRIG 171* 03/27/2012   Lab Results  Component Value Date   DDIMER 0.65* 09/04/2013    Radiology/Studies  Dg Chest Port 1 View  09/04/2013   CLINICAL DATA:  Chest pain and shortness of breath.  EXAM: PORTABLE CHEST - 1 VIEW  COMPARISON:  03/27/2012  FINDINGS: Shallow inspiration. Normal heart  size and pulmonary vascularity. Suggestion of peribronchial thickening consistent with bronchiolitis or reactive airways disease. No focal consolidation or airspace disease. No blunting of costophrenic angles. No pneumothorax.  IMPRESSION: Bronchitic changes suggesting bronchiolitis or reactive airways disease. No focal consolidation. Normal heart size.   Electronically Signed   By: Lucienne Capers M.D.   On: 09/04/2013 02:54    ECG  Atrial fibrillation with heart rate in the 70s.lateral TWI - less prominent than previous from 2013  [place]  1. New onset A. fib - unkown duration  - rate controlled on arrival, started on metoprolol 25mg  BID, had bradycardic episode down in the 30s with severe dizziness and nausea  - home amlodipine stopped  - consider holding rate control medication for now and control BP with other med  2. Acute diastolic heart failure  - he has been diuresed  3. Chest pain suggestive of unstable angina  -  occur with exertion and at rest, cardiac risk factor: HTN, dyslipidemia, DM, likely OSA (no smoking history or family h/o early MI)  - will discuss with Dr. Haroldine Laws regarding ischemic workup cath vs stress test  4. Elevated d-dimer: further workup per primary team   5. HTN 6. DM 7. Dyslipidemia: check lipid panel, consider add statin 8. A/C CKD Stage III (Cr 1.2-> 1.8)  Signed, Almyra Deforest, PA-C 09/04/2013, 1:50 PM  Patient seen and examined with Almyra Deforest, PA-C. We discussed all aspects of the encounter. I agree with the assessment and plan as stated above.   58 y/o male with DM2 (>20 years), HTN, HL and undiagnosed OSA.   Three major cardiac issues here. 1) chest pain = [place] 2) new-onset AF (unclear duration) 3) diastolic HF. I think the main issue is his progressive CP. This is very concerning for [place] and I feel strongly that he needs a cath if we can get his renal function back to baseline. Would treat with ASA, statin, heparin and very low-dose b-blocker as HR tolerates. Risks and indications of cath explained and he is agreeable to proceed would tentatively schedule for cath Thursday pending renal function. Would stop lasix.   As for his AF. Unclear duration. CHADS2 = 3. Will need NOAC after cath. Rate quite slow currently. Does not need rate control agents at this time. Will need outpatient sleep study and possible DC-CV down the road.  Diastolic HF. Volume status improved. Stop IV lasix for now. Control BP with amlodipine.   Check UA looking for proteinuria.   We will follow.   Daniel Bensimhon,MD 4:17 PM

## 2013-09-04 NOTE — Progress Notes (Signed)
Pt converted back to NSR at 2000. Strip Saved. No complaints of chest pain. Stable. Will continue to monitor.

## 2013-09-04 NOTE — Progress Notes (Addendum)
Patient took scheduled pills at 17:00. A few minutes later he became nauseous and vomited about 50 cc of yellow emesis. Pills were vomited. Patients heart rate dropped 30-40 for a few seconds. BP 150/82. HR returned to 60s-80s afib.

## 2013-09-04 NOTE — ED Provider Notes (Signed)
CSN: [id number]     Arrival date & time 09/04/13  0220 History   First MD Initiated Contact with Patient 09/04/13 0303     Chief Complaint  Patient presents with  . Shortness of Breath    when laying down     (Consider location/radiation/quality/duration/timing/severity/associated sxs/prior Treatment) HPI Comments: Patient presents today with a chief complaint of chest tightness and SOB.  He reports that he woke up from sleep just prior to arrival and his chest felt tight and he felt as if he could not breathe.  Patient reports that he has been feeling short of breath for the past 2 weeks.  He reports he feels short of breath when lying down and also reports that he wakes up in the middle of the night and feels as if he cannot breathe.  He reports associated "chest tightness", which has also been present for the past 2 weeks.  He reports that the tightness has been constant, but does worsen at times with exertion and also feels worse when he wakes up in the morning.  He denies chest pain.  He reports that the chest tightness is located across his chest and does not radiate.  He has also noticed some bilateral lower extremity edema for the past 2 weeks.  He denies fever, chills, cough, numbness, tingling, diaphoresis, nausea, or vomiting. He denies any prior cardiac history.  No history of CHF.  He denies history of COPD. He does have a history of HTN, Hyperlipidemia, and DM.   No history of DVT or PE.  No prolonged travel or surgeries in the past 4 weeks.  His PCP is Dr. Jeanie Cooks.  He denies any history of smoking.  The history is provided by the patient.    Past Medical History  Diagnosis Date  . Diabetes mellitus   . Hypertension   . Hypercholesteremia   . Degenerative disc disease   . Degenerative cervical disc    Past Surgical History  Procedure Laterality Date  . Bowel resection    . Neck surgery    . Tonsillectomy    . Hernia repair      YEARS AGO  . Cholecystectomy    . Anterior  cervical decomp/discectomy fusion  12/17/2011    Procedure: ANTERIOR CERVICAL DECOMPRESSION/DISCECTOMY FUSION 1 LEVEL;  Surgeon: Erline Levine, MD;  Location: [place] [facility];  Service: Neurosurgery;  Laterality: N/A;  Exploration of Fusion and Anterior Cervical Six-Seven Decompression/Diskectomy/Fusion  . Posterior cervical fusion/foraminotomy  12/17/2011    Procedure: POSTERIOR CERVICAL FUSION/FORAMINOTOMY LEVEL 4;  Surgeon: Erline Levine, MD;  Location: [facility];  Service: Neurosurgery;;  Posterior Cervical Three-Seven Fusion   Family History  Problem Relation Age of Onset  . Heart failure Mother   . Diabetes Mellitus II Mother   . Diabetes Mellitus II Sister    History  Substance Use Topics  . Smoking status: Never Smoker   . Smokeless tobacco: Never Used  . Alcohol Use: No    Review of Systems  All other systems reviewed and are negative.     Allergies  Penicillins  Home Medications   Prior to Admission medications   Medication Sig Start Date End Date Taking? Authorizing Provider  amLODipine (NORVASC) 10 MG tablet Take 10 mg by mouth daily.   Yes Historical Provider, MD  glimepiride (AMARYL) 4 MG tablet Take 4 mg by mouth daily with breakfast.   Yes Historical Provider, MD  metFORMIN (GLUCOPHAGE) 500 MG tablet Take 500 mg by mouth 2 (two)  times daily with a meal.   Yes Historical Provider, MD  pregabalin (LYRICA) 100 MG capsule Take 100 mg by mouth 2 (two) times daily.   Yes Historical Provider, MD   BP 153/75  Pulse 74  Temp(Src) 97.5 F (36.4 C) (Oral)  Resp 18  Ht 5\' 8"  (1.727 m)  Wt 182 lb (82.555 kg)  BMI 27.68 kg/m2  SpO2 98% Physical Exam  Nursing note and vitals reviewed. Constitutional: He appears well-developed and well-nourished.  HENT:  Head: Normocephalic and atraumatic.  Mouth/Throat: Oropharynx is clear and moist.  Neck: Normal range of motion. Neck supple.  Cardiovascular: Normal rate, regular rhythm and normal heart sounds.   Pulmonary/Chest:  Effort normal and breath sounds normal.  Musculoskeletal:  1+ pitting edema of lower extremities bilaterally from mid shin distally.  No erythema or warmth.  Neurological: He is alert.  Skin: Skin is warm and dry.  Psychiatric: He has a normal mood and affect.    ED Course  Procedures (including critical care time) Labs Review Labs Reviewed  CBC - Abnormal; Notable for the following:    HCT 36.6 (*)    MCV 77.1 (*)    MCHC 36.1 (*)    All other components within normal limits  BASIC METABOLIC PANEL  PRO B NATRIURETIC PEPTIDE  I-STAT TROPOININ, ED    Imaging Review Dg Chest Port 1 View  09/04/2013   CLINICAL DATA:  Chest pain and shortness of breath.  EXAM: PORTABLE CHEST - 1 VIEW  COMPARISON:  03/27/2012  FINDINGS: Shallow inspiration. Normal heart size and pulmonary vascularity. Suggestion of peribronchial thickening consistent with bronchiolitis or reactive airways disease. No focal consolidation or airspace disease. No blunting of costophrenic angles. No pneumothorax.  IMPRESSION: Bronchitic changes suggesting bronchiolitis or reactive airways disease. No focal consolidation. Normal heart size.   Electronically Signed   By: Lucienne Capers M.D.   On: 09/04/2013 02:54     EKG Interpretation None      Date: 09/05/2013  Rate: 70  Rhythm: atrial fibrillation  QRS Axis: normal  Intervals: atrial fibrillation  ST/T Wave abnormalities: nonspecific T wave changes  Conduction Disutrbances:none  Narrative Interpretation:   Old EKG Reviewed: changes noted, new onset a fib   5:00 AM Discussed with [facility] who has agreed to admit the patient. 5:30 AM  Patient reports improvement in chest tightness after given NG.  MDM   Final diagnoses:  None   Patient presents today with a chief complaint of PND, orthopnea, DOE, LE edema, and chest tightness that has been present for the past 2 weeks.  No known history of CHF.  BNP found to be elevated at 1304.  CXR showing normal  pulmonary vascularity and bronchitic changes.  No wheezing on exam.  EKG showing Atrial Fibrillation.  No known history.  Rate controlled.  Initial troponin negative.  Labs showing an elevated Creatine of 1.8, which is up from baseline of 1.2.  Hospitalist consulted and agreed to admit the patient.    Hyman Bible, PA-C 09/05/13 1947

## 2013-09-04 NOTE — Progress Notes (Signed)
[facility] PROGRESS NOTE  Barry Lopez [id number] DOB: [birth year] DOA: 09/04/2013 PCP: Philis Fendt, MD  Assessment/Plan: Acute CHF Patient presented with findings of bibasilar crackles, leg edema, orthopnea and PND and elevated proBNP. Received IV Lasix 40 mg x1.. one on admission. I would place him on IV Lasix 40 mg twice daily. Monitor I/O. and daily weights. Followup 2-D echo results. We'll hold off on ACE inhibitor or ARB given his poor renal function.  Chest pain at rest Patient still having off-and-on chest tightness. Continue full dose aspirin daily, nitro paste. Added metoprolol 25 mg twice daily. -Rule out ACS with serial troponin and EKG. transfer patient out to telemetry floor if chest pain free and subsequent troponins negative. -heart score of 4 for major cardiac event and will  need inpatient stress test. Cardiology consulted. -Patient known to have elevated d-dimer. Doppler or exudate negative for DVT. His symptoms appear more cardiac and is unlikely for a PE.  New onset A. fib Risk factors include hypertension and CHF. Currently rate controlled. Added metoprolol 25 mg twice daily. His chads 2 score is 3 and will benefit from being on anticoagulation. We'll follow cardiology recommendation. Check TSH.  Diabetes mellitus type 2 Reports his last A1c to be 8 several weeks ago. Patient is on metformin at home which is on hold. We'll monitor on sliding scale insulin. Recheck A1c. Has peripheral neuropathy symptoms. Continue Lyrica  Acute kidney disease versus CKD in the setting of diabetic nephropathy Noted for proteinuria on UA. Will try to obtain previous labs from his PCP. Monitor renal function while on Lasix.  Hypertension Not on any Medications at home. Added metoprolol and when necessary hydralazine  Diet: Diabetic  Code Status: Full code Family Communication: None at bedside. Will Update wife Disposition Plan: Currently on observation. Home once  stable   Consultants:  Cardiology  Procedures:  2-D echo  Antibiotics:  None  HPI/Subjective: Admission H&P reviewed. Patient reports off-and-on chest tightness. Dyspnea improved with IV Lasix received on admission.  Objective: Filed Vitals:   09/04/13 1000  BP: 146/87  Pulse: 88  Temp:   Resp: 19    Intake/Output Summary (Last 24 hours) at 09/04/13 1143 Last data filed at 09/04/13 1053  Gross per 24 hour  Intake    240 ml  Output    910 ml  Net   -670 ml   Filed Weights   09/04/13 0226 09/04/13 0830  Weight: 82.555 kg (182 lb) 87.1 kg (192 lb 0.3 oz)    Exam:   General:  Middle aged obese man in no acute distress  HEENT: No pallor, no icterus, no JVD  Cardiovascular: S1 and S2  irregularly irregular, no murmurs  Respiratory: Bibasilar crackles, no rhonchi or wheeze  Abdomen: Soft, nontender, nondistended, bowel sounds present  Musculoskeletal: Warm, 1+ pitting edema bilaterally  CNS: Alert and oriented   Data Reviewed: Basic Metabolic Panel:  Recent Labs Lab 09/04/13 0353  NA 140  K 4.2  CL 105  CO2 22  GLUCOSE 170*  BUN 32*  CREATININE 1.80*  CALCIUM 9.0   Liver Function Tests: No results found for this basename: AST, ALT, ALKPHOS, BILITOT, PROT, ALBUMIN,  in the last 168 hours No results found for this basename: LIPASE, AMYLASE,  in the last 168 hours No results found for this basename: AMMONIA,  in the last 168 hours CBC:  Recent Labs Lab 09/04/13 0259  WBC 6.4  HGB 13.2  HCT 36.6*  MCV 77.1*  PLT 172  Cardiac Enzymes:  Recent Labs Lab 09/04/13 0802  TROPONINI <0.30   BNP (last 3 results)  Recent Labs  09/04/13 0259  PROBNP 1304.0*   CBG: No results found for this basename: GLUCAP,  in the last 168 hours  Recent Results (from the past 240 hour(s))  MRSA PCR SCREENING     Status: None   Collection Time    09/04/13  8:38 AM      Result Value Ref Range Status   MRSA by PCR NEGATIVE  NEGATIVE Final    Comment:            The GeneXpert MRSA Assay (FDA     approved for NASAL specimens     only), is one component of a     comprehensive MRSA colonization     surveillance program. It is not     intended to diagnose MRSA     infection nor to guide or     monitor treatment for     MRSA infections.     Studies: Dg Chest Port 1 View  09/04/2013   CLINICAL DATA:  Chest pain and shortness of breath.  EXAM: PORTABLE CHEST - 1 VIEW  COMPARISON:  03/27/2012  FINDINGS: Shallow inspiration. Normal heart size and pulmonary vascularity. Suggestion of peribronchial thickening consistent with bronchiolitis or reactive airways disease. No focal consolidation or airspace disease. No blunting of costophrenic angles. No pneumothorax.  IMPRESSION: Bronchitic changes suggesting bronchiolitis or reactive airways disease. No focal consolidation. Normal heart size.   Electronically Signed   By: Lucienne Capers M.D.   On: 09/04/2013 02:54    Scheduled Meds: . [START ON 09/05/2013] aspirin EC  325 mg Oral Daily  . furosemide  40 mg Intravenous Q12H  . heparin  5,000 Units Subcutaneous 3 times per day  . insulin aspart  0-15 Units Subcutaneous TID WC  . insulin aspart  0-5 Units Subcutaneous QHS  . metoprolol tartrate  25 mg Oral BID  . nitroGLYCERIN  1 inch Topical 4 times per day  . pregabalin  100 mg Oral BID   Continuous Infusions:    Time spent: 25 minutes    Barry Lopez, [place]  [facility] Pager [phone]. If 7PM-7AM, please contact night-coverage at [email or web address], password [email or web address] 09/04/2013, 11:43 AM  LOS: 0 days

## 2013-09-04 NOTE — Progress Notes (Signed)
Echocardiogram 2D Echocardiogram has been performed.  Sevin Farone 09/04/2013, 10:31 AM

## 2013-09-04 NOTE — Progress Notes (Signed)
Monitor alarmed with patients heart rate 30s - 40s. Patients rate quickly came up to 90s and now in 70s. BP 148/86. Patient was nauseous. Now feels okay. PA with cardiology in room during event. Will continue to monitor.

## 2013-09-04 NOTE — Progress Notes (Signed)
*  PRELIMINARY RESULTS* Vascular Ultrasound Lower extremity venous duplex has been completed.  Preliminary findings: no evidence of DVT or baker's cyst.  Landry Mellow, RDMS, RVT  09/04/2013, 11:06 AM

## 2013-09-04 NOTE — Progress Notes (Signed)
ANTICOAGULATION CONSULT NOTE - Initial Consult  Pharmacy Consult for Heparin Indication: atrial fibrillation  Allergies  Allergen Reactions  . Penicillins Hives and Itching    Patient Measurements: Height: 5\' 8"  (172.7 cm) Weight: 192 lb 0.3 oz (87.1 kg) IBW/kg (Calculated) : 68.4 Heparin Dosing Weight: 86 kg   Vital Signs: Temp: 97.9 F (36.6 C) (07/28 1200) Temp src: Oral (07/28 1200) BP: 125/80 mmHg (07/28 1500) Pulse Rate: 73 (07/28 1540)  Labs:  Recent Labs  09/04/13 0259 09/04/13 0353 09/04/13 0802 09/04/13 1410  HGB 13.2  --   --   --   HCT 36.6*  --   --   --   PLT 172  --   --   --   CREATININE  --  1.80*  --   --   TROPONINI  --   --  <0.30 <0.30    Estimated Creatinine Clearance: 48 ml/min (by C-G formula based on Cr of 1.8).   Medical History: Past Medical History  Diagnosis Date  . Diabetes mellitus   . Hypertension   . Hypercholesteremia   . Degenerative disc disease   . Degenerative cervical disc     Medications:  Scheduled:  . amLODipine  5 mg Oral Daily  . aspirin EC  81 mg Oral Daily  . atorvastatin  40 mg Oral q1800  . carvedilol  3.125 mg Oral BID WC  . insulin aspart  0-15 Units Subcutaneous TID WC  . insulin aspart  0-5 Units Subcutaneous QHS  . nitroGLYCERIN  1 inch Topical 4 times per day  . pregabalin  100 mg Oral BID   Infusions:    Assessment:  58 yr male admitted early this AM with new onset AFib, CP with exertion and CHF  Cardiology consulted  Pharmacy requested to dose IV Heparin for AFib   Patient previously on heparin 5000 units sq q8h (last dose received @ 09:26 today).  As nearing time of next sq hep dose, will give IV heparin bolus.  Goal of Therapy:  Heparin level 0.3-0.7 units/ml Monitor platelets by anticoagulation protocol: Yes   Plan:   Heparin 4000 units IV bolus x 1 followed by infusion @ 1250 units/hr  Check heparin level 6 hr after IV heparin started  Follow daily CBC and heparin  level  Pietro Bonura, [place], PharmD 09/04/2013,4:23 PM

## 2013-09-05 ENCOUNTER — Inpatient Hospital Stay (HOSPITAL_COMMUNITY): Payer: BC Managed Care – PPO

## 2013-09-05 DIAGNOSIS — N183 Chronic kidney disease, stage 3 unspecified: Secondary | ICD-10-CM

## 2013-09-05 DIAGNOSIS — R079 Chest pain, unspecified: Secondary | ICD-10-CM | POA: Diagnosis present

## 2013-09-05 LAB — CBC
HCT: 34.5 % — ABNORMAL LOW (ref 39.0–52.0)
Hemoglobin: 12.5 g/dL — ABNORMAL LOW (ref 13.0–17.0)
MCH: 27.4 pg (ref 26.0–34.0)
MCHC: 36.2 g/dL — ABNORMAL HIGH (ref 30.0–36.0)
MCV: 75.7 fL — ABNORMAL LOW (ref 78.0–100.0)
Platelets: 142 10*3/uL — ABNORMAL LOW (ref 150–400)
RBC: 4.56 MIL/uL (ref 4.22–5.81)
RDW: 12.8 % (ref 11.5–15.5)
WBC: 7.1 10*3/uL (ref 4.0–10.5)

## 2013-09-05 LAB — HEPARIN LEVEL (UNFRACTIONATED)
Heparin Unfractionated: 0.91 IU/mL — ABNORMAL HIGH (ref 0.30–0.70)
Heparin Unfractionated: 0.68 IU/mL (ref 0.30–0.70)

## 2013-09-05 LAB — GLUCOSE, CAPILLARY
Glucose-Capillary: 114 mg/dL — ABNORMAL HIGH (ref 70–99)
Glucose-Capillary: 193 mg/dL — ABNORMAL HIGH (ref 70–99)
Glucose-Capillary: 153 mg/dL — ABNORMAL HIGH (ref 70–99)
Glucose-Capillary: 162 mg/dL — ABNORMAL HIGH (ref 70–99)

## 2013-09-05 LAB — BASIC METABOLIC PANEL
Anion gap: 11 (ref 5–15)
BUN: 30 mg/dL — ABNORMAL HIGH (ref 6–23)
CO2: 22 mEq/L (ref 19–32)
Calcium: 8.8 mg/dL (ref 8.4–10.5)
Chloride: 105 mEq/L (ref 96–112)
Creatinine, Ser: 1.7 mg/dL — ABNORMAL HIGH (ref 0.50–1.35)
GFR calc Af Amer: 49 mL/min — ABNORMAL LOW (ref >90)
GFR calc non Af Amer: 43 mL/min — ABNORMAL LOW (ref >90)
Glucose, Bld: 185 mg/dL — ABNORMAL HIGH (ref 70–99)
Potassium: 4 mEq/L (ref 3.7–5.3)
Sodium: 138 mEq/L (ref 137–147)

## 2013-09-05 LAB — TROPONIN I: Troponin I: <0.3 ng/mL (ref <0.30)

## 2013-09-05 NOTE — Progress Notes (Signed)
Patient refused to take all PO meds.

## 2013-09-05 NOTE — Progress Notes (Signed)
ANTICOAGULATION CONSULT NOTE - Follow Up Consult  Pharmacy Consult for Heparin Indication: atrial fibrillation  Allergies  Allergen Reactions  . Penicillins Hives and Itching    Patient Measurements: Height: 5\' 8"  (172.7 cm) Weight: 197 lb 8.5 oz (89.6 kg) IBW/kg (Calculated) : 68.4 Heparin Dosing Weight:   Vital Signs: Temp: 98.9 F (37.2 C) (07/29 0400) Temp src: Oral (07/29 0400) BP: 120/68 mmHg (07/29 0300) Pulse Rate: 83 (07/29 0300)  Labs:  Recent Labs  09/04/13 0259 09/04/13 0353  09/04/13 1410 09/04/13 2010 09/04/13 2258 09/05/13 0139  HGB 13.2  --   --   --   --   --  12.5*  HCT 36.6*  --   --   --   --   --  34.5*  PLT 172  --   --   --   --   --  142*  HEPARINUNFRC  --   --   --   --   --  0.53  --   CREATININE  --  1.80*  --   --   --   --  1.70*  TROPONINI  --   --   < > <0.30 <0.30  --  <0.30  < > = values in this interval not displayed.  Estimated Creatinine Clearance: 51.5 ml/min (by C-G formula based on Cr of 1.7).   Medications:  Infusions:  . heparin 1,250 Units/hr (09/04/13 1703)    Assessment: Patient with heparin at goal.  No issues noted. Goal of Therapy:  Heparin level 0.3-0.7 units/ml Monitor platelets by anticoagulation protocol: Yes   Plan:  Continue heparin drip at current rate Recheck level with AM labs  Tyler Deis, Shea Stakes [place] 09/05/2013,5:11 AM

## 2013-09-05 NOTE — Progress Notes (Signed)
[facility] PROGRESS NOTE  Barry Lopez [id number] DOB: Aug 16, 1955 DOA: 09/04/2013 PCP: Philis Fendt, MD   Brief narrative 58 y/o overweight male with HTN, type 2 DM, ? CKD due to diabetic nephrotpathy presenting with 2 weeks of progressive dyspnea with orthopnea and PND with leg swelling followed by chest tightness. Patient found to be in fluid overload on presentation and symptoms concerning for unstable angina. patient admitted to stepdown for further management.  Assessment/Plan: Acute CHF Patient presented with findings of bibasilar crackles, leg edema, orthopnea and PND and elevated proBNP. Placed on IV lasix but held after few doses given concern for worsening renal function and dyspnea improved.  Followup 2-D echo shows normal EF and no diastolic dysfn.  Monitor daily weights and I/O   Unstable angina Chest pain at rest which is quite typical. Serial troponin and EKG negative ischemia. Seen by cardiology and started on heparin drip. Added ASA,  Statin and low dose coreg. Chest pain free at present. Continue prn nitropaste. Plan on cardiac cath on 7/29.  New onset A. fib Risk factors include hypertension and CHF. Currently rate controlled.  His chads 2 score is 3 and will need long term anticoagulation. We'll follow cardiology recommendation. normal TSH.  Diabetes mellitus type 2 Reports his last A1c to be 8 several weeks ago. Patient is on metformin at home which is on hold. repeat A1C of 6.5 . Has peripheral neuropathy symptoms. Continue Lyrica  Acute kidney disease versus CKD in the setting of diabetic nephropathy Noted for proteinuria on UA. Spoke with PCP on 7/28 who informed patient ahs not had any renal fn checked at his office for past 2 years. Elevated creatinine possibly in the setting of worsening renal fn ( diabetic nephropathy) . monitor daily. Avoid NSAIDs. Check renal US  Hypertension Not on any Medications at home. BP stable  Diet: Diabetic  Code  Status: Full code Family Communication: None at bedside. Will Update wife Disposition Plan: pending cardiac cath tomorrow.    Consultants:  Cardiology  Procedures:  2-D echo  Antibiotics:  None  HPI/Subjective: Denies any chest pain. Had an episode of vomiting yesterday and HR dropped briefly to 40s. Stable overnight  Objective: Filed Vitals:   09/05/13 0800  BP:   Pulse:   Temp: 98.2 F (36.8 C)  Resp:     Intake/Output Summary (Last 24 hours) at 09/05/13 1139 Last data filed at 09/05/13 [id number]  Gross per 24 hour  Intake  202.5 ml  Output   2320 ml  Net -2117.5 ml   Filed Weights   09/04/13 0226 09/04/13 0830 09/05/13 0400  Weight: 82.555 kg (182 lb) 87.1 kg (192 lb 0.3 oz) 89.6 kg (197 lb 8.5 oz)    Exam:   General:  Middle aged obese man in no acute distress  HEENT: No pallor, no icterus,   Cardiovascular: S1 and S2  irregularly irregular, no murmurs  Respiratory: clear b//l,  no rhonchi or wheeze  Abdomen: Soft, nontender, nondistended, bowel sounds present  Musculoskeletal: Warm,  Trace  edema bilaterally  CNS: Alert and oriented   Data Reviewed: Basic Metabolic Panel:  Recent Labs Lab 09/04/13 0353 09/05/13 0139  NA 140 138  K 4.2 4.0  CL 105 105  CO2 22 22  GLUCOSE 170* 185*  BUN 32* 30*  CREATININE 1.80* 1.70*  CALCIUM 9.0 8.8   Liver Function Tests: No results found for this basename: AST, ALT, ALKPHOS, BILITOT, PROT, ALBUMIN,  in the last 168 hours No results  found for this basename: LIPASE, AMYLASE,  in the last 168 hours No results found for this basename: AMMONIA,  in the last 168 hours CBC:  Recent Labs Lab 09/04/13 0259 09/05/13 0139  WBC 6.4 7.1  HGB 13.2 12.5*  HCT 36.6* 34.5*  MCV 77.1* 75.7*  PLT 172 142*   Cardiac Enzymes:  Recent Labs Lab 09/04/13 0802 09/04/13 1410 09/04/13 2010 09/05/13 0139  TROPONINI <0.30 <0.30 <0.30 <0.30   BNP (last 3 results)  Recent Labs  09/04/13 0259  PROBNP 1304.0*    CBG:  Recent Labs Lab 09/04/13 1215 09/04/13 1724 09/04/13 2146  GLUCAP 197* 166* 244*    Recent Results (from the past 240 hour(s))  MRSA PCR SCREENING     Status: None   Collection Time    09/04/13  8:38 AM      Result Value Ref Range Status   MRSA by PCR NEGATIVE  NEGATIVE Final   Comment:            The GeneXpert MRSA Assay (FDA     approved for NASAL specimens     only), is one component of a     comprehensive MRSA colonization     surveillance program. It is not     intended to diagnose MRSA     infection nor to guide or     monitor treatment for     MRSA infections.     Studies: US Renal  09/05/2013   CLINICAL DATA:  Chronic kidney disease.  AKI.  EXAM: RENAL/URINARY TRACT ULTRASOUND COMPLETE  COMPARISON:  07/11/2012  FINDINGS: Right Kidney:  Length: 11.6 cm. Increased cortical echogenicity. No mass or hydronephrosis visualized.  Left Kidney:  Length: 12.7 cm. Increased cortical echogenicity. No mass or hydronephrosis visualized.  Bladder:  Unremarkable.  IMPRESSION: Normal size kidneys with increased cortical echogenicity compatible with medical renal disease. No hydronephrosis or focal mass.   Electronically Signed   By: Marin Olp M.D.   On: 09/05/2013 10:46   Dg Chest Port 1 View  09/04/2013   CLINICAL DATA:  Chest pain and shortness of breath.  EXAM: PORTABLE CHEST - 1 VIEW  COMPARISON:  03/27/2012  FINDINGS: Shallow inspiration. Normal heart size and pulmonary vascularity. Suggestion of peribronchial thickening consistent with bronchiolitis or reactive airways disease. No focal consolidation or airspace disease. No blunting of costophrenic angles. No pneumothorax.  IMPRESSION: Bronchitic changes suggesting bronchiolitis or reactive airways disease. No focal consolidation. Normal heart size.   Electronically Signed   By: Lucienne Capers M.D.   On: 09/04/2013 02:54    Scheduled Meds: . aspirin EC  81 mg Oral Daily  . atorvastatin  40 mg Oral q1800  .  carvedilol  3.125 mg Oral BID WC  . insulin aspart  0-15 Units Subcutaneous TID WC  . insulin aspart  0-5 Units Subcutaneous QHS  . nitroGLYCERIN  1 inch Topical 4 times per day  . pregabalin  100 mg Oral BID   Continuous Infusions: . heparin 1,050 Units/hr (09/05/13 0829)     Time spent: 25 minutes    Edyth Glomb, [place]  [facility] Pager [phone]. If 7PM-7AM, please contact night-coverage at [email or web address], password [facility] 09/05/2013, 11:39 AM  LOS: 1 day

## 2013-09-05 NOTE — Progress Notes (Signed)
ANTICOAGULATION CONSULT NOTE - Brief Note (Heparin level follow-up)  Pharmacy Consult for IV Heparin Indication: atrial fibrillation  Assessment: 63 yoM admitted with new onset AFib (unknown duration), CP with exertion and CHF. Troponins neg. Cardiology consulted and recommending cath for unstable angina pending improvement in SCr (tentative cath planned for 7/30). Pharmacy requested to dose IV Heparin for AFib (CHADS score 3 for CHF/HTN/DM).    Heparin level supratherapeutic this AM with infusion at 1250 units/hr, rate reduced to 1050 units/hr  Heparin level = 0.68  CBC ok.  No bleeding/complications reported.   Goal of Therapy:  Heparin level 0.3-0.7 units/ml Monitor platelets by anticoagulation protocol: Yes   Plan:   Continue heparin infusion at 1050 units/hr (10.5 mL/hr), at upper end therapeutic range.  Follow-up heparin level with am labs  Allergies  Allergen Reactions  . Penicillins Hives and Itching    Patient Measurements: Height: 5\' 8"  (172.7 cm) Weight: 197 lb 8.5 oz (89.6 kg) IBW/kg (Calculated) : 68.4 Heparin Dosing Weight: 86 kg  Labs:  Recent Labs  09/04/13 0259 09/04/13 0353  09/04/13 1410 09/04/13 2010 09/04/13 2258 09/05/13 0139 09/05/13 0510 09/05/13 1358  HGB 13.2  --   --   --   --   --  12.5*  --   --   HCT 36.6*  --   --   --   --   --  34.5*  --   --   PLT 172  --   --   --   --   --  142*  --   --   HEPARINUNFRC  --   --   --   --   --  0.53  --  0.91* 0.68  CREATININE  --  1.80*  --   --   --   --  1.70*  --   --   TROPONINI  --   --   < > <0.30 <0.30  --  <0.30  --   --   < > = values in this interval not displayed.  Estimated Creatinine Clearance: 51.5 ml/min (by C-G formula based on Cr of 1.7).    Doreene Eland, PharmD, BCPS.   Pager: [phone]  09/05/2013,2:58 PM

## 2013-09-05 NOTE — Progress Notes (Signed)
Per Dr. Antionette Char progress note pt will be going for cath tomorrow. Master daily schedule checked, pt will be a add on, will recheck after 5:00 and advise wife of time.

## 2013-09-05 NOTE — Progress Notes (Signed)
ANTICOAGULATION CONSULT NOTE - Follow Up Consult  Pharmacy Consult for IV Heparin Indication: atrial fibrillation  Allergies  Allergen Reactions  . Penicillins Hives and Itching    Patient Measurements: Height: 5\' 8"  (172.7 cm) Weight: 197 lb 8.5 oz (89.6 kg) IBW/kg (Calculated) : 68.4 Heparin Dosing Weight: 86 kg  Vital Signs: Temp: 98.9 F (37.2 C) (07/29 0400) Temp src: Oral (07/29 0400) BP: 149/83 mmHg (07/29 0600) Pulse Rate: 92 (07/29 0600)  Labs:  Recent Labs  09/04/13 0259 09/04/13 0353  09/04/13 1410 09/04/13 2010 09/04/13 2258 09/05/13 0139 09/05/13 0510  HGB 13.2  --   --   --   --   --  12.5*  --   HCT 36.6*  --   --   --   --   --  34.5*  --   PLT 172  --   --   --   --   --  142*  --   HEPARINUNFRC  --   --   --   --   --  0.53  --  0.91*  CREATININE  --  1.80*  --   --   --   --  1.70*  --   TROPONINI  --   --   < > <0.30 <0.30  --  <0.30  --   < > = values in this interval not displayed.  Estimated Creatinine Clearance: 51.5 ml/min (by C-G formula based on Cr of 1.7).   Assessment: 89 yoM admitted with new onset AFib (unknown duration), CP with exertion and CHF. Troponins neg. Cardiology consulted and recommending cath for unstable angina pending improvement in SCr (tentative cath planned for 7/30). Pharmacy requested to dose IV Heparin for AFib (CHADS score 3 for CHF/HTN/DM).    Heparin level supratherapeutic this AM with infusion at 1250 units/hr.  CBC ok.  No bleeding/complications reported.    Goal of Therapy:  Heparin level 0.3-0.7 units/ml Monitor platelets by anticoagulation protocol: Yes   Plan:  1.  Reduce heparin infusion to 1050 units/hr (10.5 mL/hr). 2.  Check heparin level 6 hours following rate change.  Hershal Coria 09/05/2013,8:01 AM

## 2013-09-05 NOTE — Clinical Documentation Improvement (Signed)
Possible Clinical Conditions?  Acute Renal Failure/Acute Kidney Injury Acute Tubular Necrosis Acute on Chronic Renal Failure Chronic Renal Failure Other Condition Cannot Clinically Determine   Supporting Information: (As per notes) " Spoke with PCP on the phone. Last blood work done at PCP office was in 2013. Last renal function in the system is one year back which was normal. Unclear if patient has acute kidney injury versus CKD due to diabetic nephropathy given finding of proteinuria. Will monitor off Lasix. A1C is reassuring "  Risk Factors:(As per notes)  DM, CKDIII, and HTN  Diagnostics:  Component     Latest Ref Rng 03/29/2012 07/11/2012 09/04/2013 09/05/2013  BUN     6 - 23 mg/dL 11 25 (H) 32 (H) 30 (H)  Creatinine     0.50 - 1.35 mg/dL 1.01 1.21 1.80 (H) 1.70 (H)    Thank You, Alessandra Grout, RN, BSN, CCDS,Clinical Documentation Specialist:  [phone]  [phone]=[facility] [place]- Health Information Management

## 2013-09-05 NOTE — Progress Notes (Signed)
CARE MANAGEMENT NOTE 09/05/2013  Patient:  Barry Lopez, Barry Lopez   Account Number:  [id number]  Date Initiated:  09/05/2013  Documentation initiated by:  Cheyenne Bordeaux  Subjective/Objective Assessment:   patient with new onset of multiple cardiac and systemic problems/achf/chest apin at rest/new onset of a.fib/dm type 2/ckd with htn     Action/Plan:   from home nad has been active and indep.   Anticipated DC Date:  09/08/2013   Anticipated DC Plan:  HOME/SELF CARE  In-house referral  NA      DC Planning Services  NA      [facility] Choice  NA   Choice offered to / List presented to:  NA      DME agency  NA        [facility] agency  NA   Status of service:  In process, will continue to follow Medicare Important Message given?  NA - LOS <3 / Initial given by admissions (If response is "NO", the following Medicare IM given date fields will be blank) Date Medicare IM given:   Medicare IM given by:   Date Additional Medicare IM given:   Additional Medicare IM given by:    Discharge Disposition:    Per UR Regulation:  Reviewed for med. necessity/level of care/duration of stay  If discussed at [place] of Stay Meetings, dates discussed:    Comments:  [id number]/[place] Victorio Palm: Case Management [phone] Chart reviewed for inpatient need and needs Discharge needs at time of review: none Next chart review due on [id number].

## 2013-09-05 NOTE — Progress Notes (Signed)
    Subjective:  Feels better this am. No CP overnight. Breathing improved but still mild shortness of breath.  Objective:  Vital Signs in the last 24 hours: Temp:  [97.5 F (36.4 C)-98.9 F (37.2 C)] 98.9 F (37.2 C) (07/29 0400) Pulse Rate:  [49-92] 92 (07/29 0600) Resp:  [8-23] 19 (07/29 0600) BP: (108-158)/(49-88) 149/83 mmHg (07/29 0600) SpO2:  [91 %-100 %] 100 % (07/29 0600) Weight:  [192 lb 0.3 oz (87.1 kg)-197 lb 8.5 oz (89.6 kg)] 197 lb 8.5 oz (89.6 kg) (07/29 0400)  Intake/Output from previous day: 07/28 0701 - 07/29 0700 In: 442.5 [P.O.:240; I.V.:202.5] Out: 2735 [Urine:2685; Emesis/NG output:50]  Physical Exam: Pt is alert and oriented, NAD HEENT: normal Neck: JVP - normal Lungs: coarse bilaterally CV: irregular without murmur or gallop Abd: soft, NT, Positive BS, no hepatomegaly Ext: trace pretibial edema, distal pulses intact and equal Skin: warm/dry no rash   Lab Results:  Recent Labs  09/04/13 0259 09/05/13 0139  WBC 6.4 7.1  HGB 13.2 12.5*  PLT 172 142*    Recent Labs  09/04/13 0353 09/05/13 0139  NA 140 138  K 4.2 4.0  CL 105 105  CO2 22 22  GLUCOSE 170* 185*  BUN 32* 30*  CREATININE 1.80* 1.70*    Recent Labs  09/04/13 2010 09/05/13 0139  TROPONINI <0.30 <0.30    Cardiac Studies: 2D Echo: Study Conclusions  - Left ventricle: The cavity size was normal. Wall thickness was increased in a pattern of moderate LVH. The estimated ejection fraction was 65%. Wall motion was normal; there were no regional wall motion abnormalities. - Left atrium: The atrium was mildly dilated. - Right ventricle: The cavity size was normal. Systolic function was normal.  Assessment/Plan:  1. Unstable angina, no CP on IV heparin. Plan cardiac cath tomorrow. Reviewed risks, indications - pt understands and agrees to proceed. Hold lasix today to optimize renal function.  2. Acute diastolic heart failure - improved  3. Atrial fib - anticipate  anticoagulation after cardiac cath.  Plan cardiac cath tomorrow. Repeat BMET in am. Further disposition pending cath results. Renal function has stabilized (GFR approximately 50).   Sherren Mocha, M.D. 09/05/2013, 8:21 AM

## 2013-09-06 ENCOUNTER — Encounter (HOSPITAL_COMMUNITY)
Admission: EM | Disposition: A | Discharge status: Home / Self Care | Payer: Self-pay | Source: Home / Self Care | Attending: Internal Medicine

## 2013-09-06 DIAGNOSIS — I2510 Atherosclerotic heart disease of native coronary artery without angina pectoris: Secondary | ICD-10-CM

## 2013-09-06 HISTORY — PX: LEFT HEART CATHETERIZATION WITH CORONARY ANGIOGRAM: SHX5451

## 2013-09-06 LAB — BASIC METABOLIC PANEL
Anion gap: 12 (ref 5–15)
BUN: 21 mg/dL (ref 6–23)
CO2: 24 mEq/L (ref 19–32)
Calcium: 8.8 mg/dL (ref 8.4–10.5)
Chloride: 103 mEq/L (ref 96–112)
Creatinine, Ser: 1.59 mg/dL — ABNORMAL HIGH (ref 0.50–1.35)
GFR calc Af Amer: 54 mL/min — ABNORMAL LOW (ref >90)
GFR calc non Af Amer: 46 mL/min — ABNORMAL LOW (ref >90)
Glucose, Bld: 106 mg/dL — ABNORMAL HIGH (ref 70–99)
Potassium: 3.5 mEq/L — ABNORMAL LOW (ref 3.7–5.3)
Sodium: 139 mEq/L (ref 137–147)

## 2013-09-06 LAB — GLUCOSE, CAPILLARY
Glucose-Capillary: 128 mg/dL — ABNORMAL HIGH (ref 70–99)
Glucose-Capillary: 112 mg/dL — ABNORMAL HIGH (ref 70–99)
Glucose-Capillary: 150 mg/dL — ABNORMAL HIGH (ref 70–99)

## 2013-09-06 LAB — CBC
HCT: 34.7 % — ABNORMAL LOW (ref 39.0–52.0)
HCT: 36.5 % — ABNORMAL LOW (ref 39.0–52.0)
Hemoglobin: 12.5 g/dL — ABNORMAL LOW (ref 13.0–17.0)
Hemoglobin: 13 g/dL (ref 13.0–17.0)
MCH: 27 pg (ref 26.0–34.0)
MCH: 27.1 pg (ref 26.0–34.0)
MCHC: 36 g/dL (ref 30.0–36.0)
MCHC: 35.6 g/dL (ref 30.0–36.0)
MCV: 74.9 fL — ABNORMAL LOW (ref 78.0–100.0)
MCV: 76 fL — ABNORMAL LOW (ref 78.0–100.0)
Platelets: 155 10*3/uL (ref 150–400)
Platelets: 150 10*3/uL (ref 150–400)
RBC: 4.63 MIL/uL (ref 4.22–5.81)
RBC: 4.8 MIL/uL (ref 4.22–5.81)
RDW: 12.8 % (ref 11.5–15.5)
RDW: 12.9 % (ref 11.5–15.5)
WBC: 7.6 10*3/uL (ref 4.0–10.5)
WBC: 6.1 10*3/uL (ref 4.0–10.5)

## 2013-09-06 LAB — PROTIME-INR
INR: 1.05 (ref 0.00–1.49)
Prothrombin Time: 13.7 seconds (ref 11.6–15.2)

## 2013-09-06 LAB — CREATININE, SERUM
Creatinine, Ser: 1.43 mg/dL — ABNORMAL HIGH (ref 0.50–1.35)
GFR calc Af Amer: 61 mL/min — ABNORMAL LOW (ref >90)
GFR calc non Af Amer: 53 mL/min — ABNORMAL LOW (ref >90)

## 2013-09-06 LAB — HEPARIN LEVEL (UNFRACTIONATED): Heparin Unfractionated: 0.37 IU/mL (ref 0.30–0.70)

## 2013-09-06 LAB — LIPID PANEL
Cholesterol: 118 mg/dL (ref 0–200)
HDL: 50 mg/dL (ref >39)
LDL Cholesterol: 48 mg/dL (ref 0–99)
Total CHOL/HDL Ratio: 2.4 RATIO
Triglycerides: 100 mg/dL (ref <150)
VLDL: 20 mg/dL (ref 0–40)

## 2013-09-06 SURGERY — LEFT HEART CATHETERIZATION WITH CORONARY ANGIOGRAM
Anesthesia: LOCAL

## 2013-09-06 MED ORDER — SODIUM CHLORIDE 0.9 % IV SOLN
1.0000 mL/kg/h | INTRAVENOUS | Status: DC
  Administered 2013-09-06: 1 mL/kg/h via INTRAVENOUS

## 2013-09-06 MED ORDER — SODIUM CHLORIDE 0.9 % IJ SOLN: 3.0000 mL | INTRAMUSCULAR | Status: DC | PRN

## 2013-09-06 MED ORDER — SODIUM CHLORIDE 0.9 % IV SOLN: 250.0000 mL | INTRAVENOUS | Status: DC | PRN

## 2013-09-06 MED ORDER — SODIUM CHLORIDE 0.9 % IJ SOLN
3.0000 mL | Freq: Two times a day (BID) | INTRAMUSCULAR | Status: DC
  Administered 2013-09-06: 3 mL via INTRAVENOUS

## 2013-09-06 MED ORDER — VERAPAMIL HCL 2.5 MG/ML IV SOLN
INTRAVENOUS | Status: AC
  Filled 2013-09-06: qty 2

## 2013-09-06 MED ORDER — ENOXAPARIN SODIUM 100 MG/ML ~~LOC~~ SOLN
1.0000 mg/kg | Freq: Two times a day (BID) | SUBCUTANEOUS | Status: DC
  Filled 2013-09-06 (×3): qty 1

## 2013-09-06 MED ORDER — MIDAZOLAM HCL 2 MG/2ML IJ SOLN
INTRAMUSCULAR | Status: AC
  Filled 2013-09-06: qty 2

## 2013-09-06 MED ORDER — LIDOCAINE HCL (PF) 1 % IJ SOLN
INTRAMUSCULAR | Status: AC
  Filled 2013-09-06: qty 30

## 2013-09-06 MED ORDER — POTASSIUM CHLORIDE CRYS ER 20 MEQ PO TBCR
40.0000 meq | EXTENDED_RELEASE_TABLET | Freq: Once | ORAL | Status: AC
  Administered 2013-09-06: 40 meq via ORAL
  Filled 2013-09-06: qty 2

## 2013-09-06 MED ORDER — SODIUM CHLORIDE 0.9 % IJ SOLN
3.0000 mL | INTRAMUSCULAR | Status: DC | PRN
Start: 2013-09-06 — End: 2013-09-06

## 2013-09-06 MED ORDER — SODIUM CHLORIDE 0.9 % IV SOLN: 1.0000 mL/kg/h | INTRAVENOUS | Status: AC

## 2013-09-06 MED ORDER — HEPARIN SODIUM (PORCINE) 1000 UNIT/ML IJ SOLN
INTRAMUSCULAR | Status: AC
  Filled 2013-09-06: qty 1

## 2013-09-06 MED ORDER — NITROGLYCERIN 1 MG/10 ML FOR IR/CATH LAB
INTRA_ARTERIAL | Status: AC
  Filled 2013-09-06: qty 10

## 2013-09-06 MED ORDER — FENTANYL CITRATE 0.05 MG/ML IJ SOLN
INTRAMUSCULAR | Status: AC
  Filled 2013-09-06: qty 2

## 2013-09-06 MED ORDER — SODIUM CHLORIDE 0.9 % IJ SOLN: 3.0000 mL | Freq: Two times a day (BID) | INTRAMUSCULAR | Status: DC

## 2013-09-06 NOTE — H&P (View-Only) (Signed)
     Subjective:  Feels better this am. 1 episode of acute CP& PND overnight.  Breathing improved but still mild shortness of breath. Mild nausea /HA from NTG  Objective:  Vital Signs in the last 24 hours: Temp:  [98.2 F (36.8 C)-99.7 F (37.6 C)] 99.2 F (37.3 C) (07/30 0446) Pulse Rate:  [75-96] 83 (07/30 0446) Resp:  [17-28] 22 (07/30 0446) BP: (122-157)/(50-84) 122/50 mmHg (07/30 0446) SpO2:  [96 %-100 %] 100 % (07/30 0446) Weight:  [186 lb 8.2 oz (84.6 kg)] 186 lb 8.2 oz (84.6 kg) (07/29 2315)  Intake/Output from previous day: 07/29 0701 - 07/30 0700 In: 861.4 [P.O.:720; I.V.:141.4] Out: 2320 [Urine:2320]  Physical Exam: Pt is alert and oriented, NAD HEENT: normal Neck: JVP - normal Lungs: coarse bilaterally CV: irregular without murmur or gallop Abd: soft, NT, Positive BS, no hepatomegaly Ext: trace pretibial edema, distal pulses intact and equal Skin: warm/dry no rash  Lab Results:  Recent Labs  09/05/13 0139 09/06/13 0421  WBC 7.1 7.6  HGB 12.5* 12.5*  PLT 142* 155    Recent Labs  09/05/13 0139 09/06/13 0421  NA 138 139  K 4.0 3.5*  CL 105 103  CO2 22 24  GLUCOSE 185* 106*  BUN 30* 21  CREATININE 1.70* 1.59*    Recent Labs  09/04/13 2010 09/05/13 0139  TROPONINI <0.30 <0.30    Cardiac Studies: 2D Echo: Study Conclusions  - Left ventricle: The cavity size was normal. Wall thickness was increased in a pattern of moderate LVH. The estimated ejection fraction was 65%. Wall motion was normal; there were no regional wall motion abnormalities. - Left atrium: The atrium was mildly dilated. - Right ventricle: The cavity size was normal. Systolic function was normal.  Assessment/Plan:  Principal Problem:   Unstable angina Active Problems:   Essential hypertension   New onset atrial fibrillation   CKD (chronic kidney disease), stage III   Hyperlipidemia   Diabetes mellitus   Needs sleep apnea assessment   Intermediate coronary  syndrome   Acute diastolic heart failure  1. Unstable angina, no CP on IV heparin. Plan cardiac cath tomorrow. Reviewed risks, indications - pt understands and agrees to proceed. Hold lasix today to optimize renal function.  2. Acute diastolic heart failure - improved  3. Atrial fib - anticipate anticoagulation after cardiac cath.  Plan cardiac cath today per Dr. Burt Knack. Repeat BMET in with improved renal Fxn (no LV Gram to conserve contrast) Further disposition pending cath results.  IF PCI - will transfer to [facility].   Leonie Man, M.D. 09/06/2013, 7:37 AM

## 2013-09-06 NOTE — Progress Notes (Signed)
[facility] PROGRESS NOTE  Barry Lopez [id number] DOB: 1955-05-07 DOA: 09/04/2013 PCP: Philis Fendt, MD   Brief narrative 58 y/o overweight male with HTN, type 2 DM, ? CKD due to diabetic nephrotpathy presenting with 2 weeks of progressive dyspnea with orthopnea and PND with leg swelling followed by chest tightness. Patient found to be in fluid overload on presentation and symptoms concerning for unstable angina. patient admitted to stepdown for further management.  Assessment/Plan: Acute CHF Patient presented with findings of bibasilar crackles, leg edema, orthopnea and PND and elevated proBNP. Placed on IV lasix but held after few doses given concern for worsening renal function and dyspnea improved.  Followup 2-D echo shows normal EF and no diastolic dysfn.  -negative fluid balance over 4L since admission. Symptoms resolved.    Unstable angina Chest pain at rest . Serial troponin and EKG negative . Seen by cardiology and started on heparin drip. Added ASA,  Statin and low dose coreg. Had an episode of chest pain overnight. Stable on monitor.  Continue prn nitropaste. Plan on cardiac cath today.  New onset A. fib Risk factors include hypertension and CHF. Currently rate controlled.  His chads 2 score is 3 and will need long term anticoagulation.  normal TSH.  Diabetes mellitus type 2  Patient is on metformin at home which is on hold. repeat A1C of 6.5 . Has peripheral neuropathy symptoms. Continue Lyrica  Acute kidney disease versus CKD in the setting of diabetic nephropathy Noted for proteinuria on UA. Spoke with PCP on 7/28 who informed patient ahs not had any renal fn checked at his office for past 2 years. Elevated creatinine possibly in the setting of worsening renal fn ( diabetic nephropathy) . Creatinine improving.  Avoid NSAIDs. Check renal US suggests medical renal ds.  Hypertension Not on any Medications at home. BP stable  Diet: NPO for cardiac cath  Code  Status: Full code Family Communication: Updated wife Marcie Bal on the phone Disposition Plan: pending cardiac cath results   Consultants:  Cardiology  Procedures:  2-D echo  Antibiotics:  None  HPI/Subjective: Reports some chest tightness overnight.  Objective: Filed Vitals:   09/06/13 0446  BP: 122/50  Pulse: 83  Temp: 99.2 F (37.3 C)  Resp: 22    Intake/Output Summary (Last 24 hours) at 09/06/13 0913 Last data filed at 09/06/13 0758  Gross per 24 hour  Intake 621.43 ml  Output   2225 ml  Net -1603.57 ml   Filed Weights   09/04/13 0830 09/05/13 0400 09/05/13 2315  Weight: 87.1 kg (192 lb 0.3 oz) 89.6 kg (197 lb 8.5 oz) 84.6 kg (186 lb 8.2 oz)    Exam:   General:   in no acute distress  HEENT: moist mucosa  Cardiovascular: S1 and S2 regular no murmurs  Respiratory: clear b//l,    Abdomen: Soft, nontender, nondistended, bowel sounds present  Musculoskeletal: Warm,  No edema  CNS: Alert and oriented   Data Reviewed: Basic Metabolic Panel:  Recent Labs Lab 09/04/13 0353 09/05/13 0139 09/06/13 0421  NA 140 138 139  K 4.2 4.0 3.5*  CL 105 105 103  CO2 22 22 24   GLUCOSE 170* 185* 106*  BUN 32* 30* 21  CREATININE 1.80* 1.70* 1.59*  CALCIUM 9.0 8.8 8.8   Liver Function Tests: No results found for this basename: AST, ALT, ALKPHOS, BILITOT, PROT, ALBUMIN,  in the last 168 hours No results found for this basename: LIPASE, AMYLASE,  in the last 168 hours No  results found for this basename: AMMONIA,  in the last 168 hours CBC:  Recent Labs Lab 09/04/13 0259 09/05/13 0139 09/06/13 0421  WBC 6.4 7.1 7.6  HGB 13.2 12.5* 12.5*  HCT 36.6* 34.5* 34.7*  MCV 77.1* 75.7* 74.9*  PLT 172 142* 155   Cardiac Enzymes:  Recent Labs Lab 09/04/13 0802 09/04/13 1410 09/04/13 2010 09/05/13 0139  TROPONINI <0.30 <0.30 <0.30 <0.30   BNP (last 3 results)  Recent Labs  09/04/13 0259  PROBNP 1304.0*   CBG:  Recent Labs Lab 09/05/13 0745  09/05/13 1153 09/05/13 1626 09/05/13 2137 09/06/13 0750  GLUCAP 114* 193* 153* 162* 128*    Recent Results (from the past 240 hour(s))  MRSA PCR SCREENING     Status: None   Collection Time    09/04/13  8:38 AM      Result Value Ref Range Status   MRSA by PCR NEGATIVE  NEGATIVE Final   Comment:            The GeneXpert MRSA Assay (FDA     approved for NASAL specimens     only), is one component of a     comprehensive MRSA colonization     surveillance program. It is not     intended to diagnose MRSA     infection nor to guide or     monitor treatment for     MRSA infections.     Studies: US Renal  09/05/2013   CLINICAL DATA:  Chronic kidney disease.  AKI.  EXAM: RENAL/URINARY TRACT ULTRASOUND COMPLETE  COMPARISON:  07/11/2012  FINDINGS: Right Kidney:  Length: 11.6 cm. Increased cortical echogenicity. No mass or hydronephrosis visualized.  Left Kidney:  Length: 12.7 cm. Increased cortical echogenicity. No mass or hydronephrosis visualized.  Bladder:  Unremarkable.  IMPRESSION: Normal size kidneys with increased cortical echogenicity compatible with medical renal disease. No hydronephrosis or focal mass.   Electronically Signed   By: Marin Olp M.D.   On: 09/05/2013 10:46    Scheduled Meds: . aspirin EC  81 mg Oral Daily  . atorvastatin  40 mg Oral q1800  . carvedilol  3.125 mg Oral BID WC  . insulin aspart  0-15 Units Subcutaneous TID WC  . insulin aspart  0-5 Units Subcutaneous QHS  . nitroGLYCERIN  1 inch Topical 4 times per day  . potassium chloride  40 mEq Oral Once  . pregabalin  100 mg Oral BID  . sodium chloride  3 mL Intravenous Q12H   Continuous Infusions: . sodium chloride    . heparin 1,050 Units/hr (09/05/13 1600)     Time spent: 25 minutes    Ilea Hilton, [place]  [facility] Pager [phone]. If 7PM-7AM, please contact night-coverage at [email or web address], password [facility] 09/06/2013, 9:13 AM  LOS: 2 days

## 2013-09-06 NOTE — Interval H&P Note (Signed)
History and Physical Interval Note:  09/06/2013 4:48 PM  Barry Lopez  has presented today for surgery, with the diagnosis of Unstable Angina. Principal Problem:   Unstable angina Active Problems:   Essential hypertension   New onset atrial fibrillation   CKD (chronic kidney disease), stage III   Hyperlipidemia   Diabetes mellitus   Needs sleep apnea assessment   Intermediate coronary syndrome   Acute diastolic heart failure  The various methods of treatment have been discussed with the patient and family. After consideration of risks, benefits and other options for treatment, the patient has consented to  Procedure(s): LEFT HEART CATHETERIZATION WITH CORONARY ANGIOGRAM (N/A) +/- PCI as a surgical intervention .  The patient's history has been reviewed, patient examined, no change in status, stable for surgery.  I have reviewed the patient's chart and labs.  Questions were answered to the patient's satisfaction.     HARDING,DAVID W  Cath Lab Visit (complete for each Cath Lab visit)  Clinical Evaluation Leading to the Procedure:   ACS: Yes.    Non-ACS:    Anginal Classification: CCS IV  Anti-ischemic medical therapy: Maximal Therapy (2 or more classes of medications)  Non-Invasive Test Results: No non-invasive testing performed  Prior CABG: No previous CABG

## 2013-09-06 NOTE — CV Procedure (Signed)
CARDIAC CATHETERIZATION REPORT  NAME:  Barry Lopez   MRN: [id number] DOB:  Feb 02, 1956   ADMIT DATE: 09/04/2013 Procedure Date: 09/06/2013  INTERVENTIONAL CARDIOLOGIST: Leonie Man, M.D., MS PRIMARY CARE PROVIDER: Philis Fendt, MD PRIMARY CARDIOLOGIST:  PATIENT:  Barry Lopez is a 58 y.o. male with hypertension, type 2 diabetes, dyslipidemia is admitted to [facility] Emergency room with chest pain on exertion and new onset atrial relation as well as CHF and dyspnea occasionally at rest. He was initially rate controlled with IV diltiazem and then beta blocker. He did have some bradycardia. He was diuresed for his acute diastolic failure likely secondary to A. fib. His chest pain was concerning for unstable angina given his history therefore is referred or invasive evaluation.  PRE-OPERATIVE DIAGNOSIS:    Unstable Angina  New diagnoses of A. fib  PROCEDURES PERFORMED:    Left Heart Catheterization with Native Coronary Angiography  via Right Radial Artery   PROCEDURE: The patient was brought to the 2nd [facility] Lab in the fasting state and prepped and draped in the usual sterile fashion for Right Radial artery access. A modified Allen's test was performed on the right wrist demonstrating excellent collateral flow for radial access.   Sterile technique was used including antiseptics, cap, gloves, gown, hand hygiene, mask and sheet. Skin prep: Chlorhexidine.   Consent: Risks of procedure as well as the alternatives and risks of each were explained to the (patient/caregiver). Consent for procedure obtained.   Time Out: Verified patient identification, verified procedure, site/side was marked, verified correct patient position, special equipment/implants available, medications/allergies/relevent history reviewed, required imaging and test results available. Performed.  Access:   Right Radial Artery: 6 Fr Sheath -  Seldinger Technique (Angiocath  Micropuncture Kit)  Radial Cocktail - 10 mL; IV Heparin 4500 Units   Left Heart Catheterization: 5Fr TIG 4.0 Catheter was advanced over a Versicore wire & used for Left & Right Coronary Artery Cineangiography as well as LV Hemodynamics.    After completion of angiography the Versicore wire was used to redirect the catheter across the Aortic Valve, then for removing the catheter out of the body.  Sheath removed in the Cath Lab with TR Band Placement for hemostasis.  TR Band: 1750  Hours; 12 mL air  FINDINGS:  Hemodynamics:   Central Aortic Pressure / Mean: 130/75/100 mmHg  Left Ventricular Pressure / LVEDP: 137/9/20 mmHg  Left Ventriculography: Deferred to conserve contrast. Echo just done.  Coronary Anatomy:  Dominance: Right   Left Main: Large caliber vessel that bifurcates into the LAD and Circumflex. Angiographically normal. LAD: Large caliber vessel that gives off 2 moderate caliber diagonal branches. Between the 2 vessels there is a roughly 30% smooth focal stenosis. Other than that the LAD is free of significant disease region of the apex. T1 and T2 are free of disease. T2 bifurcates.   Left Circumflex: Normal caliber nondominant vessel that gives off a very high OM1 before terminating as a lateral OM 2. Essentially angiographically normal   RCA: Very large caliber, dominant vessel is very tortuous and bifurcates distally into the Right Posterior Descending Artery (RPDA) and the  Right Posterior AV Groove Branch (RPAV). Angiographically normal  RPDA: Remains a large caliber vessel that reaches all the way to the apex and several septal perforators. Angiographically normal  RPL Sysytem:The RPAV moderate to large caliber vessel it is very tortuous and gives off a very extensive large right posterolateral branch.  PATIENT DISPOSITION:  The patient was transferred to the PACU holding area in a hemodynamicaly stable, chest pain free condition.  The patient tolerated the  procedure well, and there were no complications.  EBL:   < 5 ml  The patient was stable before, during, and after the procedure.  POST-OPERATIVE DIAGNOSIS:    Mild Single Vessel CAD with ~30% mid LAD stenosis, but no culprit lesion for Unstable Angina.  Mildly elevated LVEDP ~16-20 mmHg  PLAN OF CARE:  Transfer to [facility] -- will need to continue to evaluate symptoms & Rx PAF.  Anticipate d/c in 1-2 days.   Leonie Man, M.D., M.S. [facility] [address] [place], [place]  [address]  [phone]  09/06/2013 5:53 PM

## 2013-09-06 NOTE — Progress Notes (Signed)
ANTICOAGULATION CONSULT NOTE - Follow Up Consult  Pharmacy Consult for Heparin Indication: atrial fibrillation  Allergies  Allergen Reactions  . Penicillins Hives and Itching    Patient Measurements: Height: 5\' 8"  (172.7 cm) Weight: 186 lb 8.2 oz (84.6 kg) IBW/kg (Calculated) : 68.4 Heparin Dosing Weight: 84.6 kg  Vital Signs: Temp: 99.2 F (37.3 C) (07/30 0446) Temp src: Oral (07/30 0446) BP: 122/50 mmHg (07/30 0446) Pulse Rate: 83 (07/30 0446)  Labs:  Recent Labs  09/04/13 0259 09/04/13 0353  09/04/13 1410 09/04/13 2010  09/05/13 0139 09/05/13 0510 09/05/13 1358 09/06/13 0421  HGB 13.2  --   --   --   --   --  12.5*  --   --  12.5*  HCT 36.6*  --   --   --   --   --  34.5*  --   --  34.7*  PLT 172  --   --   --   --   --  142*  --   --  155  HEPARINUNFRC  --   --   --   --   --   < >  --  0.91* 0.68 0.37  CREATININE  --  1.80*  --   --   --   --  1.70*  --   --  1.59*  TROPONINI  --   --   < > <0.30 <0.30  --  <0.30  --   --   --   < > = values in this interval not displayed.  Estimated Creatinine Clearance: 53.6 ml/min (by C-G formula based on Cr of 1.59).   Medications:  Scheduled:  . aspirin EC  81 mg Oral Daily  . atorvastatin  40 mg Oral q1800  . carvedilol  3.125 mg Oral BID WC  . insulin aspart  0-15 Units Subcutaneous TID WC  . insulin aspart  0-5 Units Subcutaneous QHS  . nitroGLYCERIN  1 inch Topical 4 times per day  . pregabalin  100 mg Oral BID   Infusions:  . heparin 1,050 Units/hr (09/05/13 1600)   PRN: acetaminophen, diltiazem, hydrALAZINE, morphine injection, ondansetron (ZOFRAN) IV  Assessment: 63 yoM admitted with new onset AFib (unknown duration), CP with exertion and CHF. Troponins neg. Cardiology consulted and recommending cath for unstable angina pending improvement in SCr ( cath planned for 7/30). Pharmacy requested to dose IV Heparin for AFib (CHADS score 3 for CHF/HTN/DM).  Heparin level therapeutic this AM with infusion at  1050 units/hr, but trending down CBC ok. No bleeding/complications reported.   Goal of Therapy:  Heparin level 0.3-0.7 units/ml Monitor platelets by anticoagulation protocol: Yes   Plan:   Continue heparin 1050 units/hr  Follow up after cath today  Peggyann Juba, PharmD, BCPS Pager: [phone] 09/06/2013,5:07 AM

## 2013-09-06 NOTE — Progress Notes (Signed)
TR BAND REMOVAL  LOCATION:    right radial  DEFLATED PER PROTOCOL:    Yes.    TIME BAND OFF / DRESSING APPLIED:    2015   SITE UPON ARRIVAL:    Level 0  SITE AFTER BAND REMOVAL:    Level 0  REVERSE ALLEN'S TEST:     positive  CIRCULATION SENSATION AND MOVEMENT:    Within Normal Limits   Yes.    COMMENTS:   Written radial cath post care instructions given and reviewed w/ pt.  Questions answered.  Said he lifts "often as much as 50 pounds at work."  Advised pt to request work note from MD in AM.  Pt states still occasionally getting chest "tightness", points to mid-sternal area, when lying down or with taking deep breath.  Refused NTG paste because it "gives me a headache."  Denies need for morphine.  States comes and goes, doesn't last long, describes as "not too bad", rates 5/10.  Pt very concerned about what is causing the pain.  Emotional reassurance provided.  Told to call RN if pain returns and doesn't go away quickly.

## 2013-09-06 NOTE — Progress Notes (Signed)
     Subjective:  Feels better this am. 1 episode of acute CP& PND overnight.  Breathing improved but still mild shortness of breath. Mild nausea /HA from NTG  Objective:  Vital Signs in the last 24 hours: Temp:  [98.2 F (36.8 C)-99.7 F (37.6 C)] 99.2 F (37.3 C) (07/30 0446) Pulse Rate:  [75-96] 83 (07/30 0446) Resp:  [17-28] 22 (07/30 0446) BP: (122-157)/(50-84) 122/50 mmHg (07/30 0446) SpO2:  [96 %-100 %] 100 % (07/30 0446) Weight:  [186 lb 8.2 oz (84.6 kg)] 186 lb 8.2 oz (84.6 kg) (07/29 2315)  Intake/Output from previous day: 07/29 0701 - 07/30 0700 In: 861.4 [P.O.:720; I.V.:141.4] Out: 2320 [Urine:2320]  Physical Exam: Pt is alert and oriented, NAD HEENT: normal Neck: JVP - normal Lungs: coarse bilaterally CV: irregular without murmur or gallop Abd: soft, NT, Positive BS, no hepatomegaly Ext: trace pretibial edema, distal pulses intact and equal Skin: warm/dry no rash  Lab Results:  Recent Labs  09/05/13 0139 09/06/13 0421  WBC 7.1 7.6  HGB 12.5* 12.5*  PLT 142* 155    Recent Labs  09/05/13 0139 09/06/13 0421  NA 138 139  K 4.0 3.5*  CL 105 103  CO2 22 24  GLUCOSE 185* 106*  BUN 30* 21  CREATININE 1.70* 1.59*    Recent Labs  09/04/13 2010 09/05/13 0139  TROPONINI <0.30 <0.30    Cardiac Studies: 2D Echo: Study Conclusions  - Left ventricle: The cavity size was normal. Wall thickness was increased in a pattern of moderate LVH. The estimated ejection fraction was 65%. Wall motion was normal; there were no regional wall motion abnormalities. - Left atrium: The atrium was mildly dilated. - Right ventricle: The cavity size was normal. Systolic function was normal.  Assessment/Plan:  Principal Problem:   Unstable angina Active Problems:   Essential hypertension   New onset atrial fibrillation   CKD (chronic kidney disease), stage III   Hyperlipidemia   Diabetes mellitus   Needs sleep apnea assessment   Intermediate coronary  syndrome   Acute diastolic heart failure  1. Unstable angina, no CP on IV heparin. Plan cardiac cath tomorrow. Reviewed risks, indications - pt understands and agrees to proceed. Hold lasix today to optimize renal function.  2. Acute diastolic heart failure - improved  3. Atrial fib - anticipate anticoagulation after cardiac cath.  Plan cardiac cath today per Dr. Burt Knack. Repeat BMET in with improved renal Fxn (no LV Gram to conserve contrast) Further disposition pending cath results.  IF PCI - will transfer to [facility].   Leonie Man, M.D. 09/06/2013, 7:37 AM

## 2013-09-07 ENCOUNTER — Other Ambulatory Visit: Payer: Self-pay | Admitting: Physician Assistant

## 2013-09-07 ENCOUNTER — Encounter (HOSPITAL_COMMUNITY): Payer: Self-pay | Admitting: Physician Assistant

## 2013-09-07 DIAGNOSIS — N179 Acute kidney failure, unspecified: Secondary | ICD-10-CM

## 2013-09-07 DIAGNOSIS — N189 Chronic kidney disease, unspecified: Secondary | ICD-10-CM

## 2013-09-07 DIAGNOSIS — E119 Type 2 diabetes mellitus without complications: Secondary | ICD-10-CM

## 2013-09-07 DIAGNOSIS — I498 Other specified cardiac arrhythmias: Secondary | ICD-10-CM

## 2013-09-07 DIAGNOSIS — I480 Paroxysmal atrial fibrillation: Secondary | ICD-10-CM

## 2013-09-07 LAB — BASIC METABOLIC PANEL
Anion gap: 12 (ref 5–15)
BUN: 20 mg/dL (ref 6–23)
CO2: 22 mEq/L (ref 19–32)
Calcium: 8.1 mg/dL — ABNORMAL LOW (ref 8.4–10.5)
Chloride: 105 mEq/L (ref 96–112)
Creatinine, Ser: 1.48 mg/dL — ABNORMAL HIGH (ref 0.50–1.35)
GFR calc Af Amer: 58 mL/min — ABNORMAL LOW (ref >90)
GFR calc non Af Amer: 50 mL/min — ABNORMAL LOW (ref >90)
Glucose, Bld: 115 mg/dL — ABNORMAL HIGH (ref 70–99)
Potassium: 4 mEq/L (ref 3.7–5.3)
Sodium: 139 mEq/L (ref 137–147)

## 2013-09-07 LAB — CBC
HCT: 36.4 % — ABNORMAL LOW (ref 39.0–52.0)
Hemoglobin: 12.9 g/dL — ABNORMAL LOW (ref 13.0–17.0)
MCH: 27 pg (ref 26.0–34.0)
MCHC: 35.4 g/dL (ref 30.0–36.0)
MCV: 76.2 fL — ABNORMAL LOW (ref 78.0–100.0)
Platelets: 140 10*3/uL — ABNORMAL LOW (ref 150–400)
RBC: 4.78 MIL/uL (ref 4.22–5.81)
RDW: 12.9 % (ref 11.5–15.5)
WBC: 5.9 10*3/uL (ref 4.0–10.5)

## 2013-09-07 LAB — GLUCOSE, CAPILLARY
Glucose-Capillary: 88 mg/dL (ref 70–99)
Glucose-Capillary: 119 mg/dL — ABNORMAL HIGH (ref 70–99)
Glucose-Capillary: 151 mg/dL — ABNORMAL HIGH (ref 70–99)

## 2013-09-07 MED ORDER — ASPIRIN 81 MG PO TBEC: 81.0000 mg | DELAYED_RELEASE_TABLET | Freq: Every day | ORAL | Status: DC

## 2013-09-07 MED ORDER — CARVEDILOL 3.125 MG PO TABS: 3.1250 mg | ORAL_TABLET | Freq: Two times a day (BID) | ORAL | Status: DC

## 2013-09-07 MED ORDER — FUROSEMIDE 10 MG/ML IJ SOLN
20.0000 mg | Freq: Once | INTRAMUSCULAR | Status: AC
  Administered 2013-09-07: 10:00:00 20 mg via INTRAVENOUS
  Filled 2013-09-07: qty 2

## 2013-09-07 MED ORDER — APIXABAN 5 MG PO TABS
5.0000 mg | ORAL_TABLET | Freq: Two times a day (BID) | ORAL | Status: DC
  Administered 2013-09-07: 5 mg via ORAL
  Filled 2013-09-07 (×2): qty 1

## 2013-09-07 MED ORDER — APIXABAN 5 MG PO TABS: 5.0000 mg | ORAL_TABLET | Freq: Two times a day (BID) | ORAL | Status: DC

## 2013-09-07 MED ORDER — ATORVASTATIN CALCIUM 40 MG PO TABS: 40.0000 mg | ORAL_TABLET | Freq: Every day | ORAL | Status: DC

## 2013-09-07 MED ORDER — AMLODIPINE BESYLATE 10 MG PO TABS: 5.0000 mg | ORAL_TABLET | Freq: Every day | ORAL | Status: DC

## 2013-09-07 MED ORDER — AMLODIPINE BESYLATE 5 MG PO TABS
5.0000 mg | ORAL_TABLET | Freq: Every day | ORAL | Status: DC
  Administered 2013-09-07: 5 mg via ORAL
  Filled 2013-09-07: qty 1

## 2013-09-07 NOTE — Care Management Note (Addendum)
  Page 2 of 2   09/10/2013     11:19:07 AM CARE MANAGEMENT NOTE 09/10/2013  Patient:  Barry Lopez, Barry Lopez   Account Number:  [id number]  Date Initiated:  09/05/2013  Documentation initiated by:  DAVIS,RHONDA  Subjective/Objective Assessment:   patient with new onset of multiple cardiac and systemic problems/achf/chest apin at rest/new onset of a.fib/dm type 2/ckd with htn     Action/Plan:   from home nad has been active and indep.   Anticipated DC Date:  09/08/2013   Anticipated DC Plan:  HOME/SELF CARE  In-house referral  NA      DC Planning Services  NA      [facility] Choice  NA   Choice offered to / List presented to:  NA      DME agency  NA        [place] agency  NA   Status of service:  Completed, signed off Medicare Important Message given?  NA - LOS <3 / Initial given by admissions (If response is "NO", the following Medicare IM given date fields will be blank) Date Medicare IM given:   Medicare IM given by:   Date Additional Medicare IM given:   Additional Medicare IM given by:    Discharge Disposition:  HOME/SELF CARE  Per UR Regulation:  Reviewed for med. necessity/level of care/duration of stay  If discussed at [place] of Stay Meetings, dates discussed:    Comments:  09/07/13 [place], RN, BSN, [place] [phone] Spoke with pt at bedside regarding benefits check for Eliquis.  Pt has brochure with 30 day free card and refill assistance card intact.  Pt utilizes [facility] on [address] for prescription needs.  NCM called pharmacy to confirm availability of medication.  Information relayed to pt.  Pt verbalizes importance of filling medication upon discharge. PT COPAY WILL BE $20-NO PRIOR AUTH REQUIRED  [id number] Rosana Hoes, RN,BSN,CCM: Case Management [phone] Chart reviewed for inpatient need and needs Discharge needs at time of review: none Next chart review due on [id number].

## 2013-09-07 NOTE — Progress Notes (Signed)
Patient Name: Barry Lopez Date of Encounter: 09/07/2013     Principal Problem:   Chest pain: negative cath, normal EF on echo Active Problems:   Essential hypertension   Hyperlipidemia   Diabetes mellitus   New onset atrial fibrillation   Needs sleep apnea assessment   Intermediate coronary syndrome   Acute diastolic heart failure   CKD (chronic kidney disease), stage III    SUBJECTIVE  Mild chest discomfort after cath last night, appear to be only noticeable when taking deep breath. Mild SOB.  CURRENT MEDS . aspirin EC  81 mg Oral Daily  . atorvastatin  40 mg Oral q1800  . carvedilol  3.125 mg Oral BID WC  . enoxaparin (LOVENOX) injection  1 mg/kg Subcutaneous Q12H  . insulin aspart  0-15 Units Subcutaneous TID WC  . insulin aspart  0-5 Units Subcutaneous QHS  . nitroGLYCERIN  1 inch Topical 4 times per day  . pregabalin  100 mg Oral BID  . sodium chloride  3 mL Intravenous Q12H    OBJECTIVE  Filed Vitals:   09/06/13 2032 09/06/13 2100 09/07/13 0004 09/07/13 0500  BP: 154/89 157/94 152/78 143/89  Pulse: 79 78 82 75  Temp: 98 F (36.7 C)  98.3 F (36.8 C) 99.1 F (37.3 C)  TempSrc: Oral  Oral Oral  Resp: 18  18 17   Height:      Weight:   188 lb 7.9 oz (85.5 kg)   SpO2: 98% 98% 98% 97%    Intake/Output Summary (Last 24 hours) at 09/07/13 0639 Last data filed at 09/07/13 0503  Gross per 24 hour  Intake 843.52 ml  Output   1750 ml  Net -906.48 ml   Filed Weights   09/05/13 0400 09/05/13 2315 09/07/13 0004  Weight: 197 lb 8.5 oz (89.6 kg) 186 lb 8.2 oz (84.6 kg) 188 lb 7.9 oz (85.5 kg)    PHYSICAL EXAM  General: Pleasant, NAD. Neuro: Alert and oriented X 3. Moves all extremities spontaneously. Psych: Normal affect. HEENT:  Normal  Neck: Supple without bruits or JVD. Lungs:  Resp regular and unlabored, CTA. Heart: RRR no s3, s4, or murmurs. R radial cath site appears to be stable with good pulse, no bleeding or hematoma Abdomen: Soft,  non-tender, non-distended, BS + x 4.  Extremities: No clubbing, cyanosis or edema. DP/PT/Radials 2+ and equal bilaterally.  Accessory Clinical Findings  CBC  Recent Labs  09/06/13 2210 09/07/13 0446  WBC 6.1 5.9  HGB 13.0 12.9*  HCT 36.5* 36.4*  MCV 76.0* 76.2*  PLT 150 [id number]*   Basic Metabolic Panel  Recent Labs  09/06/13 0421 09/06/13 2210 09/07/13 0446  NA 139  --  139  K 3.5*  --  4.0  CL 103  --  105  CO2 24  --  22  GLUCOSE 106*  --  115*  BUN 21  --  20  CREATININE 1.59* 1.43* 1.48*  CALCIUM 8.8  --  8.1*    Cardiac Enzymes  Recent Labs  09/04/13 1410 09/04/13 2010 09/05/13 0139  TROPONINI <0.30 <0.30 <0.30   D-Dimer  Recent Labs  09/04/13 0802  DDIMER 0.65*   Hemoglobin A1C  Recent Labs  09/04/13 0802  HGBA1C 6.5*   Fasting Lipid Panel  Recent Labs  09/06/13 0421  CHOL 118  HDL 50  LDLCALC 48  TRIG 100  CHOLHDL 2.4   Thyroid Function Tests  Recent Labs  09/04/13 0802  TSH 2.580    TELE  NSR with 70-80s, no significant ventricular ectopy  ECG  NSR with HR 70s, TWI in lateral leads, no significant ST changes  Radiology/Studies  US Renal  09/05/2013   CLINICAL DATA:  Chronic kidney disease.  AKI.  EXAM: RENAL/URINARY TRACT ULTRASOUND COMPLETE  COMPARISON:  07/11/2012  FINDINGS: Right Kidney:  Length: 11.6 cm. Increased cortical echogenicity. No mass or hydronephrosis visualized.  Left Kidney:  Length: 12.7 cm. Increased cortical echogenicity. No mass or hydronephrosis visualized.  Bladder:  Unremarkable.  IMPRESSION: Normal size kidneys with increased cortical echogenicity compatible with medical renal disease. No hydronephrosis or focal mass.   Electronically Signed   By: Marin Olp M.D.   On: 09/05/2013 10:46   Dg Chest Port 1 View  09/04/2013   CLINICAL DATA:  Chest pain and shortness of breath.  EXAM: PORTABLE CHEST - 1 VIEW  COMPARISON:  03/27/2012  FINDINGS: Shallow inspiration. Normal heart size and pulmonary  vascularity. Suggestion of peribronchial thickening consistent with bronchiolitis or reactive airways disease. No focal consolidation or airspace disease. No blunting of costophrenic angles. No pneumothorax.  IMPRESSION: Bronchitic changes suggesting bronchiolitis or reactive airways disease. No focal consolidation. Normal heart size.   Electronically Signed   By: Lucienne Capers M.D.   On: 09/04/2013 02:54    ASSESSMENT AND PLAN  1. Chest pain, noncardiac  - nonobstructive cath 7/30, consider other etiology  2. Minimal CAD  - echo 7/28 EF 65%, no regional wall motion abnorm, mildly dilated LA  - cath 7/30 single vessel CAD with 30% mid LAD stenosis  - continue ASA, statin, low dose coreg, add amlodipine  3. Atrial fibrillation - converted  - TSH normal  - CHA2DS2-VASC score 3 (CHF, HTN, DM)  - consider start eliquis 5mg  BID today  4. Symptomatic bradycardia while on metoprolol (transient, possibly vasovagal response)  - no further sign of bradycardia while in NSR  - continue low dose coreg for now  5. Acute on chronic renal insufficiency: 2/2 diabetic nephropathy  6. Acute diastolic HF in the setting of a-fib  - LVEDP mildly elevated 16-82mmHg  - add 20mg  lasix x 1 today  7. HTN  - add 5mg  amlodipine for BP control  8. DM - no metformin for 48 hours after contrast dye use, can resume on 7/2  9. Elevated d-dimer: suspicion for PE is low, LE U/S negative for DVT  Disposition: possible discharge today or tomorrow  Signed, Almyra Deforest PA-C Pager: [phone]

## 2013-09-07 NOTE — Progress Notes (Addendum)
He has no critical CAD. Symptoms related to diastolic HF and a fib.He needs better BP control. He needs anticoagulation for at least 6 weeks and potentially long term. Needs OP continuous monitor for 30 days. Needs sleep study. Will need cardiology f/u to help with ultimate management of AF/anticoagulation. When BP controlled and dyspnea improved, he can be discharged.

## 2013-09-07 NOTE — Progress Notes (Signed)
ANTICOAGULATION CONSULT NOTE - Initial Consult  Pharmacy Consult for Eliquis Indication: atrial fibrillation  Allergies  Allergen Reactions  . Penicillins Hives and Itching    Patient Measurements: Height: 5\' 8"  (172.7 cm) Weight: 188 lb 7.9 oz (85.5 kg) IBW/kg (Calculated) : 68.4  Vital Signs: Temp: 99.1 F (37.3 C) (07/31 0500) Temp src: Oral (07/31 0500) BP: 143/89 mmHg (07/31 0500) Pulse Rate: 75 (07/31 0500)  Labs:  Recent Labs  09/04/13 1410 09/04/13 2010  09/05/13 0139 09/05/13 0510 09/05/13 1358 09/06/13 0421 09/06/13 2210 09/07/13 0446  HGB  --   --   < > 12.5*  --   --  12.5* 13.0 12.9*  HCT  --   --   < > 34.5*  --   --  34.7* 36.5* 36.4*  PLT  --   --   < > 142*  --   --  155 150 140*  LABPROT  --   --   --   --   --   --  13.7  --   --   INR  --   --   --   --   --   --  1.05  --   --   HEPARINUNFRC  --   --   < >  --  0.91* 0.68 0.37  --   --   CREATININE  --   --   < > 1.70*  --   --  1.59* 1.43* 1.48*  TROPONINI <0.30 <0.30  --  <0.30  --   --   --   --   --   < > = values in this interval not displayed.  Estimated Creatinine Clearance: 57.9 ml/min (by C-G formula based on Cr of 1.48).   Medical History: Past Medical History  Diagnosis Date  . Diabetes mellitus   . Hypertension   . Hypercholesteremia   . Degenerative disc disease   . Degenerative cervical disc   . PAF (paroxysmal atrial fibrillation)   . Chronic diastolic heart failure     a. in the setting of a-fib. Nonobstructive cath 09/06/2013 30% LAD    Medications:  Prescriptions prior to admission  Medication Sig Dispense Refill  . amLODipine (NORVASC) 10 MG tablet Take 10 mg by mouth daily.      [name] glimepiride (AMARYL) 4 MG tablet Take 4 mg by mouth daily with breakfast.      . metFORMIN (GLUCOPHAGE) 500 MG tablet Take 500 mg by mouth 2 (two) times daily with a meal.      . pregabalin (LYRICA) 100 MG capsule Take 100 mg by mouth 2 (two) times daily.       Scheduled:  .  amLODipine  5 mg Oral Daily  . apixaban  5 mg Oral BID  . aspirin EC  81 mg Oral Daily  . atorvastatin  40 mg Oral q1800  . carvedilol  3.125 mg Oral BID WC  . furosemide  20 mg Intravenous Once  . insulin aspart  0-15 Units Subcutaneous TID WC  . insulin aspart  0-5 Units Subcutaneous QHS  . nitroGLYCERIN  1 inch Topical 4 times per day  . pregabalin  100 mg Oral BID  . sodium chloride  3 mL Intravenous Q12H    Assessment: 58yo male admitted 7/28 w/ chest tightness, found to be in new-onset Afib, was started on heparin, now s/p cardiac cath and to transition to Eliquis for long-term anticoagulation (CHADS2 = 3), heparin was stopped yesterday.  Plan:  Will begin Eliquis 5mg  po BID (SCr has not been stable but even if SCr rises pt would need to be low weight or elderly to adjust dose, so dose should not need adjusting any time soon) and begin education.  Wynona Neat, PharmD, BCPS  09/07/2013,7:20 AM

## 2013-09-07 NOTE — Discharge Summary (Signed)
Physician Discharge Summary  Barry Lopez [id number] DOB: 1956-01-26 DOA: 09/04/2013  PCP: Philis Fendt, MD  Admit date: 09/04/2013 Discharge date: 09/07/2013  Time spent: 75minutes  Recommendations for Outpatient Follow-up:  Chest pain, noncardiac - Resolved   - nonobstructive cath 7/30,   CAD  - echo 7/28 EF 65%, no regional wall motion abnorm, mildly dilated LA  - cath 7/30 single vessel CAD with 30% mid LAD stenosis  - continue ASA 81 mg  -Discharge on Lipitor 40 mg Daily  - Discharge on Coreg 3.125 mg BID  -Discharge on Amlodipine 5mg  Daily  -F/U With Cardiology on 8/24 @ 1215  Atrial fibrillation  - Currently in NSR   - TSH normal  - CHA2DS2-VASC score 3 (CHF, HTN, DM)  - D/C on Eliquis 5mg  BIDper Cardiology (Pt will remain on for at least 6 wks) - Cardiology Office will call patient for 30day event monitor. If you do not hear from [place] within 48 hours after discharge please call our office   Symptomatic Bradycardia (while on metoprolol) -resolved    Acute on Chronic Renal Insufficiency (Diabetic Nephropathy ) - PCP to monitior Renal function and BP  Acute diastolic HF in the setting of a-fib  - LVEDP mildly elevated 16-30mmHg  - D/C on Lasix 20mg  Daily    HTN  - W/I AHA Guidelines  - See CAD   DM  Type 2  -Resume Metformin on 8/2  -Resume Amaryl 4mg  Daily   Elevated d-dimer -suspicion for PE is low, LE U/S negative for DVT    Discharge Diagnoses:  Principal Problem:   Chest pain Active Problems:   Essential hypertension   Hyperlipidemia   Diabetes mellitus   New onset atrial fibrillation   Needs sleep apnea assessment   Intermediate coronary syndrome   Acute diastolic heart failure   CKD (chronic kidney disease), stage III   Discharge Condition: stable  Diet recommendation: AHA/ADA diet  Filed Weights   09/05/13 0400 09/05/13 2315 09/07/13 0004  Weight: 89.6 kg (197 lb 8.5 oz) 84.6 kg (186 lb 8.2 oz) 85.5 kg (188 lb 7.9 oz)     History of present illness:  58 y/o BM PMHx  HTN, type 2 DM, ? CKD due to diabetic nephrotpathy presenting with 2 weeks of progressive dyspnea with orthopnea and PND with leg swelling followed by chest tightness. Patient found to be in fluid overload on presentation and symptoms concerning for unstable angina. patient admitted to stepdown for further management.Cardiac cath was performed on 7/30 and showed Pt to have Mild Single Vessel CAD with ~30% mid LAD stenosis, but no culprit lesion for Unstable Angina. Mildly elevated LVEDP ~16-20 mmHg.    Procedures: 7/28 echocardiogram; - Left ventricle: moderate LVH. LVEF= 65%. - Left atrium:  mildly dilated.  7/28 bilateral lower extremity venous Doppler; No evidence DVT/superficial thrombosis 7/30 cardiac catheterization; single vessel CAD with 30% mid LAD stenosis      Consultations: Dr. Consuela Mimes (cardiology)   Antibiotics NA   Discharge Exam: Filed Vitals:   09/07/13 0004 09/07/13 0500 09/07/13 0802 09/07/13 1239  BP: 152/78 143/89 146/73 128/91  Pulse: 82 75 79 86  Temp: 98.3 F (36.8 C) 99.1 F (37.3 C) 99.1 F (37.3 C) 98.9 F (37.2 C)  TempSrc: Oral Oral Oral Oral  Resp: 18 17 16 18   Height:      Weight: 85.5 kg (188 lb 7.9 oz)     SpO2: 98% 97% 99% 98%    General: A./O. x4, NAD  Cardiovascular: Regular rhythm and rate, negative murmurs rubs or gallops, normal S1/S2 Respiratory: Clear to auscultation bilateral  Discharge Instructions     Medication List         amLODipine 10 MG tablet  Commonly known as:  NORVASC  Take 0.5 tablets (5 mg total) by mouth daily.     apixaban 5 MG Tabs tablet  Commonly known as:  ELIQUIS  Take 1 tablet (5 mg total) by mouth 2 (two) times daily.     aspirin 81 MG EC tablet  Take 1 tablet (81 mg total) by mouth daily.     atorvastatin 40 MG tablet  Commonly known as:  LIPITOR  Take 1 tablet (40 mg total) by mouth daily at 6 PM.     carvedilol 3.125 MG tablet   Commonly known as:  COREG  Take 1 tablet (3.125 mg total) by mouth 2 (two) times daily with a meal.     glimepiride 4 MG tablet  Commonly known as:  AMARYL  Take 4 mg by mouth daily with breakfast.     metFORMIN 500 MG tablet  Commonly known as:  GLUCOPHAGE  Take 500 mg by mouth 2 (two) times daily with a meal.     pregabalin 100 MG capsule  Commonly known as:  LYRICA  Take 100 mg by mouth 2 (two) times daily.       Allergies  Allergen Reactions  . Penicillins Hives and Itching   Follow-up Information   Follow up with Ermalinda Barrios, PA-C On 10/01/2013. (12:15pm)    Specialty:  Cardiology   Contact information:   [address] [place] [address] [phone]       Follow up with [facility]. (Office will call patient for 30day event monitor. If you do not hear from [place] within 48 hours after discharge please call our office)    Specialty:  Cardiology   Contact information:   [address] [place] [address] [phone]      Please follow up. (discuss with your primary care provider regarding signs and symptoms of obstructive sleep apnea and potential outpatient sleep study)       Follow up with AVBUERE,EDWIN A, MD. Schedule an appointment as soon as possible for a visit in 1 week. [facility] visit. )    Specialty:  Internal Medicine   Contact information:   [place] [place] [address] [phone]        The results of significant diagnostics from this hospitalization (including imaging, microbiology, ancillary and laboratory) are listed below for reference.    Significant Diagnostic Studies: US Renal  09/05/2013   CLINICAL DATA:  Chronic kidney disease.  AKI.  EXAM: RENAL/URINARY TRACT ULTRASOUND COMPLETE  COMPARISON:  07/11/2012  FINDINGS: Right Kidney:  Length: 11.6 cm. Increased cortical echogenicity. No mass or hydronephrosis visualized.  Left Kidney:  Length: 12.7 cm. Increased cortical  echogenicity. No mass or hydronephrosis visualized.  Bladder:  Unremarkable.  IMPRESSION: Normal size kidneys with increased cortical echogenicity compatible with medical renal disease. No hydronephrosis or focal mass.   Electronically Signed   By: Marin Olp M.D.   On: 09/05/2013 10:46   Dg Chest Port 1 View  09/04/2013   CLINICAL DATA:  Chest pain and shortness of breath.  EXAM: PORTABLE CHEST - 1 VIEW  COMPARISON:  03/27/2012  FINDINGS: Shallow inspiration. Normal heart size and pulmonary vascularity. Suggestion of peribronchial thickening consistent with bronchiolitis or reactive airways  disease. No focal consolidation or airspace disease. No blunting of costophrenic angles. No pneumothorax.  IMPRESSION: Bronchitic changes suggesting bronchiolitis or reactive airways disease. No focal consolidation. Normal heart size.   Electronically Signed   By: Lucienne Capers M.D.   On: 09/04/2013 02:54    Microbiology: Recent Results (from the past 240 hour(s))  MRSA PCR SCREENING     Status: None   Collection Time    09/04/13  8:38 AM      Result Value Ref Range Status   MRSA by PCR NEGATIVE  NEGATIVE Final   Comment:            The GeneXpert MRSA Assay (FDA     approved for NASAL specimens     only), is one component of a     comprehensive MRSA colonization     surveillance program. It is not     intended to diagnose MRSA     infection nor to guide or     monitor treatment for     MRSA infections.     Labs: Basic Metabolic Panel:  Recent Labs Lab 09/04/13 0353 09/05/13 0139 09/06/13 0421 09/06/13 2210 09/07/13 0446  NA 140 138 139  --  139  K 4.2 4.0 3.5*  --  4.0  CL 105 105 103  --  105  CO2 22 22 24   --  22  GLUCOSE 170* 185* 106*  --  115*  BUN 32* 30* 21  --  20  CREATININE 1.80* 1.70* 1.59* 1.43* 1.48*  CALCIUM 9.0 8.8 8.8  --  8.1*   Liver Function Tests: No results found for this basename: AST, ALT, ALKPHOS, BILITOT, PROT, ALBUMIN,  in the last 168 hours No results  found for this basename: LIPASE, AMYLASE,  in the last 168 hours No results found for this basename: AMMONIA,  in the last 168 hours CBC:  Recent Labs Lab 09/04/13 0259 09/05/13 0139 09/06/13 0421 09/06/13 2210 09/07/13 0446  WBC 6.4 7.1 7.6 6.1 5.9  HGB 13.2 12.5* 12.5* 13.0 12.9*  HCT 36.6* 34.5* 34.7* 36.5* 36.4*  MCV 77.1* 75.7* 74.9* 76.0* 76.2*  PLT 172 142* 155 150 140*   Cardiac Enzymes:  Recent Labs Lab 09/04/13 0802 09/04/13 1410 09/04/13 2010 09/05/13 0139  TROPONINI <0.30 <0.30 <0.30 <0.30   BNP: BNP (last 3 results)  Recent Labs  09/04/13 0259  PROBNP 1304.0*   CBG:  Recent Labs Lab 09/06/13 1220 09/06/13 1641 09/06/13 2115 09/07/13 0803 09/07/13 1238  GLUCAP 112* 88 150* 119* 151*       Signed:  Dia Crawford, MD [facility] [phone] pager

## 2013-09-07 NOTE — Progress Notes (Signed)
Patient ambulated briskly in hall, with steady gait and maintained an O2 saturation on 95-97%. Tolerated well with no shortness of breath or chest pain. Ambulated 350 feet.

## 2013-09-07 NOTE — Discharge Instructions (Addendum)
Information on my medicine - ELIQUIS (apixaban)  This medication education was reviewed with me or my healthcare representative as part of my discharge preparation.  The pharmacist that spoke with me during my hospital stay was:  Ysidro Evert So, Tate.D.  Why was Eliquis prescribed for you? Eliquis was prescribed for you to reduce the risk of a blood clot forming that can cause a stroke if you have a medical condition called atrial fibrillation (a type of irregular heartbeat).  What do You need to know about Eliquis ? Take your Eliquis TWICE DAILY - one tablet in the morning and one tablet in the evening with or without food. If you have difficulty swallowing the tablet whole please discuss with your pharmacist how to take the medication safely.  Take Eliquis exactly as prescribed by your doctor and DO NOT stop taking Eliquis without talking to the doctor who prescribed the medication.  Stopping may increase your risk of developing a stroke.  Refill your prescription before you run out.  After discharge, you should have regular check-up appointments with your healthcare provider that is prescribing your Eliquis.  In the future your dose may need to be changed if your kidney function or weight changes by a significant amount or as you get older.  What do you do if you miss a dose? If you miss a dose, take it as soon as you remember on the same day and resume taking twice daily.  Do not take more than one dose of ELIQUIS at the same time to make up a missed dose.  Important Safety Information A possible side effect of Eliquis is bleeding. You should call your healthcare provider right away if you experience any of the following:   Bleeding from an injury or your nose that does not stop.   Unusual colored urine (red or dark brown) or unusual colored stools (red or black).   Unusual bruising for unknown reasons.   A serious fall or if you hit your head (even if there is no bleeding).  Some  medicines may interact with Eliquis and might increase your risk of bleeding or clotting while on Eliquis. To help avoid this, consult your healthcare provider or pharmacist prior to using any new prescription or non-prescription medications, including herbals, vitamins, non-steroidal anti-inflammatory drugs (NSAIDs) and supplements.  This website has more information on Eliquis (apixaban): [email or web address].no.

## 2013-09-10 ENCOUNTER — Encounter (INDEPENDENT_AMBULATORY_CARE_PROVIDER_SITE_OTHER): Payer: BC Managed Care – PPO

## 2013-09-10 ENCOUNTER — Encounter: Payer: Self-pay | Admitting: *Deleted

## 2013-09-10 DIAGNOSIS — I480 Paroxysmal atrial fibrillation: Secondary | ICD-10-CM

## 2013-09-10 DIAGNOSIS — I4891 Unspecified atrial fibrillation: Secondary | ICD-10-CM

## 2013-09-10 NOTE — Progress Notes (Signed)
Patient ID: Barry Lopez, male   DOB: 02-16-1955, 58 y.o.   MRN: [id number] E-Cardio verite 30 day cardiac event monitor applied to patient.

## 2013-09-10 NOTE — ED Provider Notes (Signed)
Medical screening examination/treatment/procedure(s) were conducted as a shared visit with non-physician practitioner(s) and myself.  I personally evaluated the patient during the encounter.   EKG Interpretation   Date/Time:  Tuesday September 04 2013 02:40:09 EDT Ventricular Rate:  70 PR Interval:    QRS Duration: 85 QT Interval:  369 QTC Calculation: 398 R Axis:   27 Text Interpretation:  Atrial fibrillation RSR' in V1 or V2, probably  normal variant Abnormal T, consider ischemia, lateral leads ED PHYSICIAN  INTERPRETATION AVAILABLE IN [facility] Confirmed by TEST, Record  ([id number]) on 09/06/2013 7:49:36 AM      Pt comes in with cc of DIB. Hx concerning for CHF - new onset. BNP is elevated. EKG shoes no STEMI. Will admit  Varney Biles, MD 09/10/13 [phone]

## 2013-09-24 ENCOUNTER — Telehealth: Payer: Self-pay | Admitting: Cardiology

## 2013-09-24 NOTE — Telephone Encounter (Signed)
8.14.15 Patient brought paperwork for FMLA . Stated he would need to bring payment on Mon 8.17   8.17.15 Patient brought remainder of paperwork.  Paperwork was processed and sent to [employer] on 8.17.15 lp

## 2013-10-01 ENCOUNTER — Encounter: Payer: Self-pay | Admitting: Physician Assistant

## 2013-10-01 ENCOUNTER — Ambulatory Visit (INDEPENDENT_AMBULATORY_CARE_PROVIDER_SITE_OTHER): Payer: BC Managed Care – PPO | Admitting: Physician Assistant

## 2013-10-01 VITALS — BP 135/84 | HR 79 | Ht 67.0 in | Wt 191.0 lb

## 2013-10-01 DIAGNOSIS — I200 Unstable angina: Secondary | ICD-10-CM

## 2013-10-01 DIAGNOSIS — I4891 Unspecified atrial fibrillation: Secondary | ICD-10-CM

## 2013-10-01 DIAGNOSIS — I5031 Acute diastolic (congestive) heart failure: Secondary | ICD-10-CM

## 2013-10-01 DIAGNOSIS — N183 Chronic kidney disease, stage 3 unspecified: Secondary | ICD-10-CM

## 2013-10-01 DIAGNOSIS — I480 Paroxysmal atrial fibrillation: Secondary | ICD-10-CM

## 2013-10-01 DIAGNOSIS — E785 Hyperlipidemia, unspecified: Secondary | ICD-10-CM

## 2013-10-01 LAB — BASIC METABOLIC PANEL
BUN: 29 mg/dL — ABNORMAL HIGH (ref 6–23)
CO2: 25 mEq/L (ref 19–32)
Calcium: 8.7 mg/dL (ref 8.4–10.5)
Chloride: 103 mEq/L (ref 96–112)
Creatinine, Ser: 1.6 mg/dL — ABNORMAL HIGH (ref 0.4–1.5)
GFR: 56.13 mL/min — ABNORMAL LOW (ref >60.00)
Glucose, Bld: 226 mg/dL — ABNORMAL HIGH (ref 70–99)
Potassium: 4 mEq/L (ref 3.5–5.1)
Sodium: 135 mEq/L (ref 135–145)

## 2013-10-01 MED ORDER — APIXABAN 5 MG PO TABS: 5.0000 mg | ORAL_TABLET | Freq: Two times a day (BID) | ORAL | Status: AC

## 2013-10-01 MED ORDER — FUROSEMIDE 20 MG PO TABS: 20.0000 mg | ORAL_TABLET | Freq: Every day | ORAL | Status: DC

## 2013-10-01 NOTE — Assessment & Plan Note (Signed)
Followup fasting lipid panel and LFTs in 6 weeks.

## 2013-10-01 NOTE — Patient Instructions (Signed)
Your physician has recommended you make the following change in your medication:  1. Start Lasix 20 mg 1 tab daily  Your physician recommends that you have lab work today: BMET  Your physician recommends that you return for a FASTING lipid profile: and LFT, BMET in 4 weeks   Your physician recommends that you schedule a follow-up appointment in: 2 months with Dr. Aundra Dubin

## 2013-10-01 NOTE — Assessment & Plan Note (Signed)
Maintaining normal sinus rhythm. Continue Eliquis.

## 2013-10-01 NOTE — Progress Notes (Signed)
HPI: This is a 58 year old male patient with hypertension, type 2 diabetes mellitus, and chronic kidney disease due to diabetic nephropathy who presented to the hospital with CHF, new atrial fibrillation and chest pain. Was found to have diastolic heart failure and underwent cardiac catheterization on 09/06/13 that showed mild single vessel CAD with approximately 30% mid LAD no culprit lesion. He had mildly elevated LVEDP at 16-20 mm mercury. 2-D echo EF was 65% with LVH mildly dilated left atrium. He had bradycardia on metoprolol which was stopped. He also had new onset atrial fibrillation converted to NSR, CHA2DS2-VASC score 3 and was discharged home on Eliquis 5 mg twice a day.  Patient is currently wearing a monitor and has maintained normal sinus rhythm. There is 1 strip that looks like it could possibly be atrial fibrillation but there is a lot of artifact. Patient has had no symptoms. He was supposed to go home on Lasix 20 mg once daily but was never given a prescription. He is complaining of ankle swelling. He denies any chest pain, palpitations, dyspnea, dyspnea on exertion, dizziness or presyncope.  Allergies  Allergen Reactions  . Penicillins Hives and Itching     Current Outpatient Prescriptions  Medication Sig Dispense Refill  . amLODipine (NORVASC) 10 MG tablet Take 0.5 tablets (5 mg total) by mouth daily.  30 tablet  0  . apixaban (ELIQUIS) 5 MG TABS tablet Take 1 tablet (5 mg total) by mouth 2 (two) times daily.  60 tablet  0  . aspirin EC 81 MG EC tablet Take 1 tablet (81 mg total) by mouth daily.  30 tablet  0  . atorvastatin (LIPITOR) 40 MG tablet Take 1 tablet (40 mg total) by mouth daily at 6 PM.  30 tablet  0  . carvedilol (COREG) 3.125 MG tablet Take 1 tablet (3.125 mg total) by mouth 2 (two) times daily with a meal.  60 tablet  0  . glimepiride (AMARYL) 4 MG tablet Take 4 mg by mouth daily with breakfast.      . metFORMIN (GLUCOPHAGE) 500 MG tablet Take 500 mg  by mouth 2 (two) times daily with a meal.      . pregabalin (LYRICA) 100 MG capsule Take 100 mg by mouth 2 (two) times daily.       No current facility-administered medications for this visit.    Past Medical History  Diagnosis Date  . Diabetes mellitus   . Hypertension   . Hypercholesteremia   . Degenerative disc disease   . Degenerative cervical disc   . PAF (paroxysmal atrial fibrillation)   . Chronic diastolic heart failure     a. in the setting of a-fib. Nonobstructive cath 09/06/2013 30% LAD    Past Surgical History  Procedure Laterality Date  . Bowel resection    . Neck surgery    . Tonsillectomy    . Hernia repair      YEARS AGO  . Cholecystectomy    . Anterior cervical decomp/discectomy fusion  12/17/2011    Procedure: ANTERIOR CERVICAL DECOMPRESSION/DISCECTOMY FUSION 1 LEVEL;  Surgeon: Erline Levine, MD;  Location: [facility];  Service: Neurosurgery;  Laterality: N/A;  Exploration of Fusion and Anterior Cervical Six-Seven Decompression/Diskectomy/Fusion  . Posterior cervical fusion/foraminotomy  12/17/2011    Procedure: POSTERIOR CERVICAL FUSION/FORAMINOTOMY LEVEL 4;  Surgeon: Erline Levine, MD;  Location: [facility];  Service: Neurosurgery;;  Posterior Cervical Three-Seven Fusion    Family History  Problem Relation Age of Onset  .  Heart failure Mother   . Diabetes Mellitus II Mother   . Diabetes Mellitus II Sister     History   Social History  . Marital Status: Married    Spouse Name: N/A    Number of Children: N/A  . Years of Education: N/A   Occupational History  . Not on file.   Social History Main Topics  . Smoking status: Never Smoker   . Smokeless tobacco: Never Used  . Alcohol Use: No  . Drug Use: No  . Sexual Activity: No   Other Topics Concern  . Not on file   Social History Narrative  . No narrative on file    ROS: See history of present illness otherwise negative  BP 135/84  Pulse 79  Ht 5\' 7"  (1.702 m)  Wt 191 lb (86.637 kg)   BMI 29.91 kg/m2  PHYSICAL EXAM: Well-nournished, in no acute distress. Neck: No JVD, HJR, Bruit, or thyroid enlargement  Lungs: No tachypnea, clear without wheezing, rales, or rhonchi  Cardiovascular: RRR, PMI not displaced, heart sounds normal, no murmurs, gallops, bruit, thrill, or heave.  Abdomen: BS normal. Soft without organomegaly, masses, lesions or tenderness.  Extremities: without cyanosis, clubbing or edema. Good distal pulses bilateral  SKin: Warm, no lesions or rashes   Musculoskeletal: No deformities  Neuro: no focal signs   Wt Readings from Last 3 Encounters:  09/07/13 188 lb 7.9 oz (85.5 kg)  09/07/13 188 lb 7.9 oz (85.5 kg)  03/29/12 182 lb 5.1 oz (82.7 kg)     EKG: No EKG done today because he is wearing a monitor and he is maintaining normal sinus rhythm.  Cardiac catheterization 09/06/13 FINDINGS:  Hemodynamics:    Central Aortic Pressure / Mean: 130/75/100 mmHg  Left Ventricular Pressure / LVEDP: 137/9/20 mmHg  Left Ventriculography: Deferred to conserve contrast. Echo just done.  Coronary Anatomy:  Dominance: Right  Left Main: Large caliber vessel that bifurcates into the LAD and Circumflex. Angiographically normal. LAD: Large caliber vessel that gives off 2 moderate caliber diagonal branches. Between the 2 vessels there is a roughly 30% smooth focal stenosis. Other than that the LAD is free of significant disease region of the apex. T1 and T2 are free of disease. T2 bifurcates.   Left Circumflex: Normal caliber nondominant vessel that gives off a very high OM1 before terminating as a lateral OM 2. Essentially angiographically normal     RCA: Very large caliber, dominant vessel is very tortuous and bifurcates distally into the Right Posterior Descending Artery (RPDA) and the  Right Posterior AV Groove Branch (RPAV). Angiographically normal  RPDA: Remains a large caliber vessel that reaches all the way to the apex and several septal perforators.  Angiographically normal  RPL Sysytem:The RPAV moderate to large caliber vessel it is very tortuous and gives off a very extensive large right posterolateral branch.  PATIENT DISPOSITION:    The patient was transferred to the PACU holding area in a hemodynamicaly stable, chest pain free condition.  The patient tolerated the procedure well, and there were no complications.  EBL:   < 5 ml  The patient was stable before, during, and after the procedure.  POST-OPERATIVE DIAGNOSIS:    Mild Single Vessel CAD with ~30% mid LAD stenosis, but no culprit lesion for Unstable Angina.  Mildly elevated LVEDP ~16-20 mmHg  PLAN OF CARE:  Transfer to [facility] -- will need to continue to evaluate symptoms & Rx PAF.  Anticipate d/c in 1-2 days. Cardiac  Studies: 2D Echo: Study Conclusions  - Left ventricle: The cavity size was normal. Wall thickness was increased in a pattern of moderate LVH. The estimated ejection fraction was 65%. Wall motion was normal; there were no regional wall motion abnormalities. - Left atrium: The atrium was mildly dilated. - Right ventricle: The cavity size was normal. Systolic function was normal.

## 2013-10-01 NOTE — Assessment & Plan Note (Signed)
Followup renal function today.

## 2013-10-01 NOTE — Assessment & Plan Note (Signed)
Patient has a little ankle swelling. He was not sent home on Lasix. Will give him a prescription for Lasix 20 mg once daily. Followup labs today

## 2013-10-01 NOTE — Assessment & Plan Note (Signed)
Nonobstructive CAD at cath. No further chest pain.

## 2013-10-02 NOTE — Telephone Encounter (Signed)
8.24.15 Received FMLA & Attending Physician Statement from [employer] @ [place].  Given to Earlie Server RN for Dr Ellyn Hack to sign.  8.25.15 lp

## 2013-10-03 ENCOUNTER — Telehealth: Payer: Self-pay | Admitting: Cardiology

## 2013-10-03 NOTE — Telephone Encounter (Signed)
Pt needs a letter to return to work on 10/16/13: Per Dr Aundra Dubin; pt needs to be F/U with Dr. Ellyn Hack in [facility], and the  to return to work note request needs to be send to [place]. Pt has an appointment with Dr. Ellyn Hack on 12/04/13 at 3:15 PM. Pt aware. A map with directions to Dr. Allison Quarry office at [facility] was mailed to pt.

## 2013-10-03 NOTE — Telephone Encounter (Signed)
New message           Pt needs letter stating he can return to work on 9/8

## 2013-10-03 NOTE — Telephone Encounter (Signed)
I don't see where I have ever seen this patient.  Should go to [place]. Probably makes more sense for him to followup with Ellyn Hack who saw him in the hospital.  No idea how he was attached to me.

## 2013-10-03 NOTE — Telephone Encounter (Signed)
Pt had a cardiac cath on 09/06/13, a 30 days event monitor placed on 8/17. He was seen post op by Ermalinda Barrios PA on 8/24, pt has an office visit with Dr. Aundra Dubin on 12/03/13 (first visit). Pt would like to have a letter stating he can go back to work on 9/8. Pt's will come to have the event monitor removed on 10/11/13. He would like to get the letter then.

## 2013-10-07 ENCOUNTER — Emergency Department (HOSPITAL_COMMUNITY): Payer: BC Managed Care – PPO

## 2013-10-07 ENCOUNTER — Emergency Department (HOSPITAL_COMMUNITY)
Admission: EM | Admit: 2013-10-07 | Discharge: 2013-10-07 | Disposition: A | Discharge status: Home / Self Care | Payer: BC Managed Care – PPO | Attending: Emergency Medicine | Admitting: Emergency Medicine

## 2013-10-07 ENCOUNTER — Encounter (HOSPITAL_COMMUNITY): Payer: Self-pay | Admitting: Emergency Medicine

## 2013-10-07 DIAGNOSIS — Z7982 Long term (current) use of aspirin: Secondary | ICD-10-CM | POA: Diagnosis not present

## 2013-10-07 DIAGNOSIS — E119 Type 2 diabetes mellitus without complications: Secondary | ICD-10-CM | POA: Diagnosis not present

## 2013-10-07 DIAGNOSIS — I4891 Unspecified atrial fibrillation: Secondary | ICD-10-CM | POA: Diagnosis not present

## 2013-10-07 DIAGNOSIS — R05 Cough: Secondary | ICD-10-CM | POA: Insufficient documentation

## 2013-10-07 DIAGNOSIS — E7800 Pure hypercholesterolemia, unspecified: Secondary | ICD-10-CM | POA: Diagnosis not present

## 2013-10-07 DIAGNOSIS — I5032 Chronic diastolic (congestive) heart failure: Secondary | ICD-10-CM | POA: Diagnosis not present

## 2013-10-07 DIAGNOSIS — R0789 Other chest pain: Secondary | ICD-10-CM | POA: Diagnosis not present

## 2013-10-07 DIAGNOSIS — J3489 Other specified disorders of nose and nasal sinuses: Secondary | ICD-10-CM | POA: Insufficient documentation

## 2013-10-07 DIAGNOSIS — I10 Essential (primary) hypertension: Secondary | ICD-10-CM | POA: Diagnosis not present

## 2013-10-07 DIAGNOSIS — R079 Chest pain, unspecified: Secondary | ICD-10-CM | POA: Insufficient documentation

## 2013-10-07 DIAGNOSIS — R059 Cough, unspecified: Secondary | ICD-10-CM | POA: Diagnosis not present

## 2013-10-07 DIAGNOSIS — R062 Wheezing: Secondary | ICD-10-CM | POA: Insufficient documentation

## 2013-10-07 DIAGNOSIS — Z880 Allergy status to penicillin: Secondary | ICD-10-CM | POA: Diagnosis not present

## 2013-10-07 DIAGNOSIS — Z8739 Personal history of other diseases of the musculoskeletal system and connective tissue: Secondary | ICD-10-CM | POA: Diagnosis not present

## 2013-10-07 DIAGNOSIS — Z79899 Other long term (current) drug therapy: Secondary | ICD-10-CM | POA: Diagnosis not present

## 2013-10-07 LAB — I-STAT TROPONIN, ED: Troponin i, poc: 0.01 ng/mL (ref 0.00–0.08)

## 2013-10-07 LAB — CBC
HCT: 36.9 % — ABNORMAL LOW (ref 39.0–52.0)
Hemoglobin: 13.6 g/dL (ref 13.0–17.0)
MCH: 27.8 pg (ref 26.0–34.0)
MCHC: 36.9 g/dL — ABNORMAL HIGH (ref 30.0–36.0)
MCV: 75.3 fL — ABNORMAL LOW (ref 78.0–100.0)
Platelets: 149 10*3/uL — ABNORMAL LOW (ref 150–400)
RBC: 4.9 MIL/uL (ref 4.22–5.81)
RDW: 12.4 % (ref 11.5–15.5)
WBC: 6.3 10*3/uL (ref 4.0–10.5)

## 2013-10-07 LAB — BASIC METABOLIC PANEL
Anion gap: 13 (ref 5–15)
BUN: 32 mg/dL — ABNORMAL HIGH (ref 6–23)
CO2: 22 mEq/L (ref 19–32)
Calcium: 8.7 mg/dL (ref 8.4–10.5)
Chloride: 103 mEq/L (ref 96–112)
Creatinine, Ser: 1.58 mg/dL — ABNORMAL HIGH (ref 0.50–1.35)
GFR calc Af Amer: 54 mL/min — ABNORMAL LOW (ref >90)
GFR calc non Af Amer: 47 mL/min — ABNORMAL LOW (ref >90)
Glucose, Bld: 207 mg/dL — ABNORMAL HIGH (ref 70–99)
Potassium: 4.4 mEq/L (ref 3.7–5.3)
Sodium: 138 mEq/L (ref 137–147)

## 2013-10-07 LAB — PRO B NATRIURETIC PEPTIDE: Pro B Natriuretic peptide (BNP): 105.4 pg/mL (ref 0–125)

## 2013-10-07 LAB — TROPONIN I: Troponin I: <0.3 ng/mL (ref <0.30)

## 2013-10-07 MED ORDER — FENTANYL CITRATE 0.05 MG/ML IJ SOLN: 50.0000 ug | INTRAMUSCULAR | Status: DC | PRN

## 2013-10-07 MED ORDER — HYDROCODONE-ACETAMINOPHEN 5-325 MG PO TABS
2.0000 | ORAL_TABLET | Freq: Once | ORAL | Status: AC
  Administered 2013-10-07: 2 via ORAL
  Filled 2013-10-07: qty 2

## 2013-10-07 MED ORDER — ALBUTEROL SULFATE HFA 108 (90 BASE) MCG/ACT IN AERS
2.0000 | INHALATION_SPRAY | Freq: Once | RESPIRATORY_TRACT | Status: AC
  Administered 2013-10-07: 2 via RESPIRATORY_TRACT
  Filled 2013-10-07: qty 6.7

## 2013-10-07 MED ORDER — GUAIFENESIN-CODEINE 100-10 MG/5ML PO SOLN: 5.0000 mL | ORAL | Status: DC | PRN

## 2013-10-07 MED ORDER — IPRATROPIUM-ALBUTEROL 0.5-2.5 (3) MG/3ML IN SOLN
6.0000 mL | Freq: Once | RESPIRATORY_TRACT | Status: AC
  Administered 2013-10-07: 6 mL via RESPIRATORY_TRACT
  Filled 2013-10-07: qty 6
  Filled 2013-10-07: qty 3

## 2013-10-07 NOTE — Discharge Instructions (Signed)
If you were given medicines take as directed.  If you are on coumadin or contraceptives realize their levels and effectiveness is altered by many different medicines.  If you have any reaction (rash, tongues swelling, other) to the medicines stop taking and see a physician.   Please follow up as directed and return to the ER or see a physician for new or worsening symptoms.  Thank you. Filed Vitals:   10/07/13 1611 10/07/13 1654 10/07/13 1656  BP: 141/76 150/85   Pulse: 81  77  Temp: 98.6 F (37 C)    TempSrc: Oral    Resp: 22 17 22   SpO2: 99%  100%

## 2013-10-07 NOTE — ED Provider Notes (Addendum)
CSN: [id number]     Arrival date & time 10/07/13  1559 History   First MD Initiated Contact with Patient 10/07/13 1637     Chief Complaint  Patient presents with  . Chest Pain     (Consider location/radiation/quality/duration/timing/severity/associated sxs/prior Treatment) HPI Comments: 58 year old male with history otitis, high blood pressure, lipids, diabetes, atrophic relation, aspirin use, diastolic heart failure, recent cardiac cath showing mid LAD 30% nonobstructive presents with productive cough, sharp chest pain since Wednesday. Patient feels burning sensation with coughing that leads to upper chest discomfort and sharp sensation upper mid chest. Fairly constant worse with coughing. Patient taking medicines regularly. Patient has cardiology followup outpatient. No significant radiation to the symptoms. Nonexertional no diaphoresis.  Patient is a 58 y.o. male presenting with chest pain. The history is provided by the patient.  Chest Pain Associated symptoms: cough   Associated symptoms: no abdominal pain, no back pain, no fever, no headache, no shortness of breath and not vomiting     Past Medical History  Diagnosis Date  . Diabetes mellitus   . Hypertension   . Hypercholesteremia   . Degenerative disc disease   . Degenerative cervical disc   . PAF (paroxysmal atrial fibrillation)   . Chronic diastolic heart failure     a. in the setting of a-fib. Nonobstructive cath 09/06/2013 30% LAD   Past Surgical History  Procedure Laterality Date  . Bowel resection    . Neck surgery    . Tonsillectomy    . Hernia repair      YEARS AGO  . Cholecystectomy    . Anterior cervical decomp/discectomy fusion  12/17/2011    Procedure: ANTERIOR CERVICAL DECOMPRESSION/DISCECTOMY FUSION 1 LEVEL;  Surgeon: Erline Levine, MD;  Location: [facility];  Service: Neurosurgery;  Laterality: N/A;  Exploration of Fusion and Anterior Cervical Six-Seven Decompression/Diskectomy/Fusion  . Posterior cervical  fusion/foraminotomy  12/17/2011    Procedure: POSTERIOR CERVICAL FUSION/FORAMINOTOMY LEVEL 4;  Surgeon: Erline Levine, MD;  Location: [facility];  Service: Neurosurgery;;  Posterior Cervical Three-Seven Fusion   Family History  Problem Relation Age of Onset  . Heart failure Mother   . Diabetes Mellitus II Mother   . Diabetes Mellitus II Sister    History  Substance Use Topics  . Smoking status: Never Smoker   . Smokeless tobacco: Never Used  . Alcohol Use: No    Review of Systems  Constitutional: Negative for fever and chills.  HENT: Positive for congestion.   Eyes: Negative for visual disturbance.  Respiratory: Positive for cough. Negative for shortness of breath.   Cardiovascular: Positive for chest pain. Negative for leg swelling.  Gastrointestinal: Negative for vomiting and abdominal pain.  Genitourinary: Negative for dysuria and flank pain.  Musculoskeletal: Negative for back pain, neck pain and neck stiffness.  Skin: Negative for rash.  Neurological: Negative for light-headedness and headaches.      Allergies  Penicillins  Home Medications   Prior to Admission medications   Medication Sig Start Date End Date Taking? Authorizing Provider  amLODipine (NORVASC) 10 MG tablet Take 0.5 tablets (5 mg total) by mouth daily. 09/07/13  Yes Allie Bossier, MD  apixaban (ELIQUIS) 5 MG TABS tablet Take 1 tablet (5 mg total) by mouth 2 (two) times daily. 10/01/13  Yes Imogene Burn, PA-C  aspirin EC 81 MG EC tablet Take 1 tablet (81 mg total) by mouth daily. 09/07/13  Yes Allie Bossier, MD  atorvastatin (LIPITOR) 40 MG tablet Take 1 tablet (40  mg total) by mouth daily at 6 PM. 09/07/13  Yes Allie Bossier, MD  carvedilol (COREG) 3.125 MG tablet Take 1 tablet (3.125 mg total) by mouth 2 (two) times daily with a meal. 09/07/13  Yes Allie Bossier, MD  furosemide (LASIX) 20 MG tablet Take 1 tablet (20 mg total) by mouth daily. 10/01/13  Yes Imogene Burn, PA-C  glimepiride (AMARYL) 4  MG tablet Take 4 mg by mouth daily with breakfast.   Yes Historical Provider, MD  metFORMIN (GLUCOPHAGE) 500 MG tablet Take 500 mg by mouth 2 (two) times daily with a meal.   Yes Historical Provider, MD  pregabalin (LYRICA) 100 MG capsule Take 100 mg by mouth 2 (two) times daily.   Yes Historical Provider, MD   BP 150/85  Pulse 77  Temp(Src) 98.6 F (37 C) (Oral)  Resp 22  SpO2 100% Physical Exam  Nursing note and vitals reviewed. Constitutional: He is oriented to person, place, and time. He appears well-developed and well-nourished.  HENT:  Head: Normocephalic and atraumatic.  Eyes: Conjunctivae are normal. Right eye exhibits no discharge. Left eye exhibits no discharge.  Neck: Normal range of motion. Neck supple. No tracheal deviation present.  Cardiovascular: Normal rate and regular rhythm.   Pulmonary/Chest: Effort normal. He has wheezes (Expiratory bilateral worse on the left with few rales).  Abdominal: Soft. He exhibits no distension. There is no tenderness. There is no guarding.  Musculoskeletal: He exhibits no edema and no tenderness.  Neurological: He is alert and oriented to person, place, and time.  Skin: Skin is warm. No rash noted.  Psychiatric: He has a normal mood and affect.    ED Course  Procedures (including critical care time) Labs Review Labs Reviewed  CBC - Abnormal; Notable for the following:    HCT 36.9 (*)    MCV 75.3 (*)    MCHC 36.9 (*)    Platelets 149 (*)    All other components within normal limits  BASIC METABOLIC PANEL - Abnormal; Notable for the following:    Glucose, Bld 207 (*)    BUN 32 (*)    Creatinine, Ser 1.58 (*)    GFR calc non Af Amer 47 (*)    GFR calc Af Amer 54 (*)    All other components within normal limits  PRO B NATRIURETIC PEPTIDE  I-STAT TROPOININ, ED    Imaging Review No results found.   EKG Interpretation   Date/Time:  Sunday October 07 2013 16:04:18 EDT Ventricular Rate:  84 PR Interval:  150 QRS Duration:  88 QT Interval:  364 QTC Calculation: 430 R Axis:   49 Text Interpretation:  Normal sinus rhythm ST \\T \ T wave abnormality,  consider lateral ischemia Abnormal ECG Similar to previous Confirmed by  Ngina Royer  MD, Korion Cuevas ([id number]) on 10/07/2013 4:37:35 PM      MDM   Final diagnoses:  Wheezing  Chest tightness  Cough   Clinically patient presents as bronchospasm/bronchitis versus pneumonia. With cardiac risk factors and recent cath plan for cardiac screen and delta troponin. Pain medicines given in ER. Chest x-ray pending and nebulizer ordered for wheezing.  EKG had multiple small T wave abnormality similar to previous.  Patient will be signed out with plan to followup after treatments, second troponin and final disposition which likely will be close outpatient followup since patient recently had a cardiac cath.  Chest tightness, cough, wheezing     Mariea Clonts, MD 10/07/13 1725  Mariea Clonts, MD  10/07/13 1730 

## 2013-10-07 NOTE — ED Notes (Signed)
Patient transported to X-ray 

## 2013-10-07 NOTE — ED Notes (Signed)
Pt sts chest pain with cough x 1 week. Denies any edema.

## 2013-10-08 ENCOUNTER — Other Ambulatory Visit: Payer: Self-pay | Admitting: Physician Assistant

## 2013-10-08 DIAGNOSIS — N183 Chronic kidney disease, stage 3 unspecified: Secondary | ICD-10-CM

## 2013-10-08 NOTE — Telephone Encounter (Signed)
Patient can have a note to return to work 10/16/13

## 2013-10-11 ENCOUNTER — Other Ambulatory Visit (INDEPENDENT_AMBULATORY_CARE_PROVIDER_SITE_OTHER): Payer: BC Managed Care – PPO

## 2013-10-11 DIAGNOSIS — N183 Chronic kidney disease, stage 3 unspecified: Secondary | ICD-10-CM

## 2013-10-11 LAB — BASIC METABOLIC PANEL
BUN: 25 mg/dL — ABNORMAL HIGH (ref 6–23)
CO2: 23 mEq/L (ref 19–32)
Calcium: 8.6 mg/dL (ref 8.4–10.5)
Chloride: 105 mEq/L (ref 96–112)
Creatinine, Ser: 1.7 mg/dL — ABNORMAL HIGH (ref 0.4–1.5)
GFR: 52.75 mL/min — ABNORMAL LOW (ref >60.00)
Glucose, Bld: 156 mg/dL — ABNORMAL HIGH (ref 70–99)
Potassium: 3.7 mEq/L (ref 3.5–5.1)
Sodium: 135 mEq/L (ref 135–145)

## 2013-10-17 NOTE — Telephone Encounter (Signed)
Follow up     Patient returning call back to triage nurse

## 2013-10-17 NOTE — Telephone Encounter (Signed)
Per Ivin Booty Dr. Ellyn Hack signed today and given to MR to send to employer.

## 2013-10-18 IMAGING — CR DG CERVICAL SPINE 1V
1 series · 1 of 1 positions shown · non-contrast
Comparison: Cervical spine radiographs - 12/01/2011; cervical spine
CT - 12/07/2011

CLINICAL DATA: ACDF - 1 level

DG CERVICAL SPINE - 1 VIEW

[view not recorded]
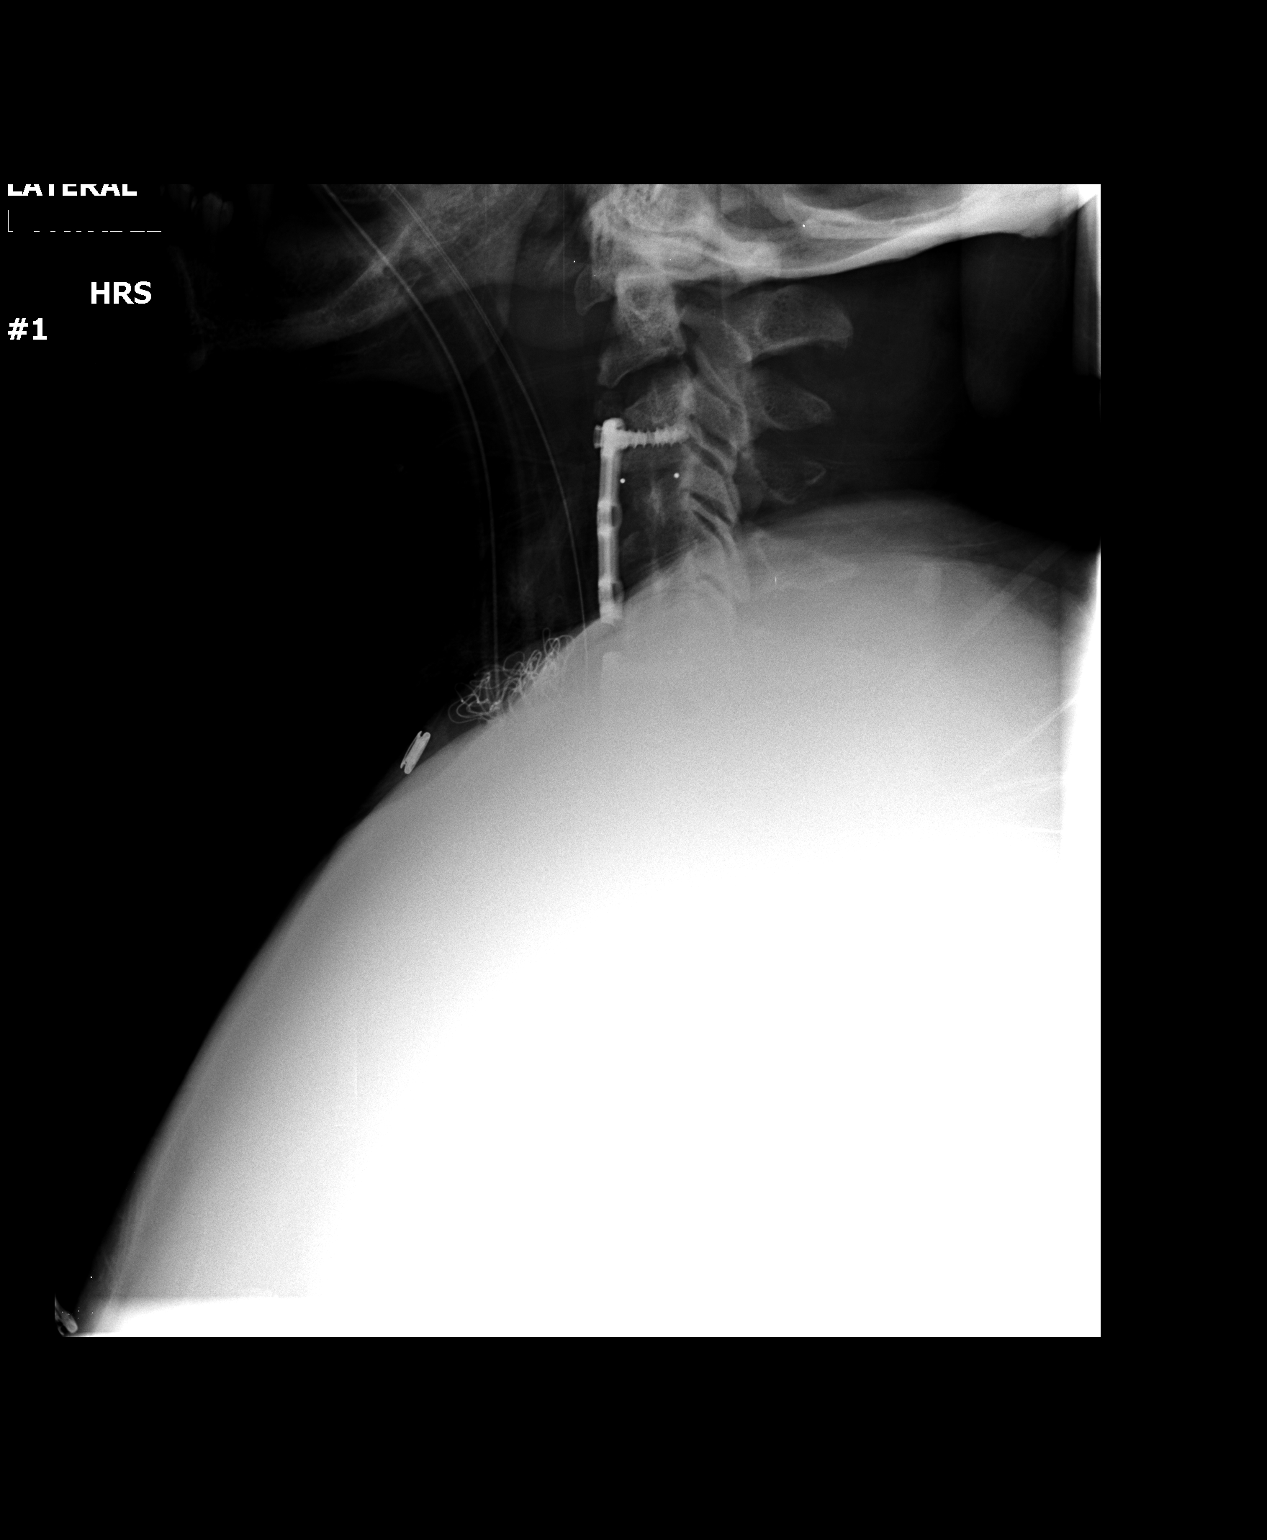

[1 of 1 positions shown; findings below may reference images not displayed]

FINDINGS: A single lateral intraoperative radiograph of the cervical spine is
provided for review.

C1 to the superior endplate of C6 is visualized.  Evaluation of C6
and C7 is degraded secondary to overlying osseous and soft tissue
structures.

Post C4 and C5 corpectomy.  There has been apparent removal of the
inferior aspect of the previously identified C3 - C6 ACDF plate at
the level of C5. The cranial aspect of an additional ACDF plate is
seen at C6.

Enteric tube and endotracheal tube tips are excluded from view.  A
radiopaque drain overlies the inferior aspect of the operative
site.
IMPRESSION: Degraded intraoperative cervical spine radiograph as above.

## 2013-10-19 ENCOUNTER — Other Ambulatory Visit: Payer: Self-pay | Admitting: *Deleted

## 2013-10-19 DIAGNOSIS — N183 Chronic kidney disease, stage 3 unspecified: Secondary | ICD-10-CM

## 2013-10-19 DIAGNOSIS — I5031 Acute diastolic (congestive) heart failure: Secondary | ICD-10-CM

## 2013-10-19 NOTE — Addendum Note (Signed)
Addended by: Thompson Grayer on: 10/19/2013 03:48 PM   Modules accepted: Orders

## 2013-10-24 ENCOUNTER — Telehealth: Payer: Self-pay | Admitting: *Deleted

## 2013-10-24 ENCOUNTER — Telehealth: Payer: Self-pay | Admitting: Cardiology

## 2013-10-24 NOTE — Telephone Encounter (Signed)
Pt wants to know if he needs to fast for his lab work?

## 2013-10-24 NOTE — Telephone Encounter (Signed)
MONITOR RESULTS GIVEN EARILER.  LEFT MESSAGE - DOES NOT NEEDED FAST.

## 2013-10-24 NOTE — Telephone Encounter (Signed)
Left message to call back  Concerning event monitor- per Dr Ellyn Hack - no abnormality on the test.

## 2013-10-25 ENCOUNTER — Telehealth: Payer: Self-pay | Admitting: Cardiology

## 2013-10-25 ENCOUNTER — Other Ambulatory Visit (INDEPENDENT_AMBULATORY_CARE_PROVIDER_SITE_OTHER): Payer: BC Managed Care – PPO

## 2013-10-25 DIAGNOSIS — N183 Chronic kidney disease, stage 3 unspecified: Secondary | ICD-10-CM

## 2013-10-25 DIAGNOSIS — I5031 Acute diastolic (congestive) heart failure: Secondary | ICD-10-CM

## 2013-10-25 LAB — BASIC METABOLIC PANEL
BUN: 31 mg/dL — ABNORMAL HIGH (ref 6–23)
CO2: 26 mEq/L (ref 19–32)
Calcium: 8.8 mg/dL (ref 8.4–10.5)
Chloride: 108 mEq/L (ref 96–112)
Creatinine, Ser: 1.9 mg/dL — ABNORMAL HIGH (ref 0.4–1.5)
GFR: 47.89 mL/min — ABNORMAL LOW (ref >60.00)
Glucose, Bld: 109 mg/dL — ABNORMAL HIGH (ref 70–99)
Potassium: 3.9 mEq/L (ref 3.5–5.1)
Sodium: 139 mEq/L (ref 135–145)

## 2013-10-25 NOTE — Telephone Encounter (Signed)
10/17/13 received signed FMLA and signed Attending Physician Statement from Dr Ellyn Hack.  Faxed Attending Physician Statement to [facility] on 10/17/13.  Faxed FMLA form to [employer] on 10/17/13.

## 2013-10-29 ENCOUNTER — Other Ambulatory Visit: Payer: BC Managed Care – PPO

## 2013-11-09 ENCOUNTER — Telehealth: Payer: Self-pay

## 2013-11-09 ENCOUNTER — Telehealth: Payer: Self-pay | Admitting: Cardiology

## 2013-11-09 NOTE — Telephone Encounter (Signed)
Message copied by Theodoro Parma on Fri Nov 09, 2013  5:42 PM ------      Message from: Ellyn Hack, DAVID W      Created: Thu Nov 08, 2013  9:44 PM       Hold Lasix x 2 days.            Leonie Man, MD            ----- Message -----         From: Emmaline Life, RN         Sent: 11/08/2013   4:19 PM           To: Leonie Man, MD, Imogene Burn, PA-C            Reviewed results with patient who reports he is taking Lasix 20 mg once daily.  Pt has f/u appointment with Dr. Ellyn Hack on 10/27.  I am forwarding results to Dr. Ellyn Hack and Ermalinda Barrios, PA-C       ------

## 2013-11-09 NOTE — Telephone Encounter (Signed)
Returned call to pt. Denies any shortness of breath & no weight gain. He does take the lasix 20 mg daily. Has appointment with sleep apnea study Monday by [facility]  Pt states he was returning Katie's call. Message forwarded to Dimas Alexandria RN

## 2013-11-09 NOTE — Telephone Encounter (Signed)
Informed patient that per Dr. Ellyn Hack, he is to hold Lasix for 2 days and resume on Monday.  Pt st he understands.

## 2013-11-09 NOTE — Telephone Encounter (Signed)
New message     patient calling back to speak with nurse.

## 2013-11-12 ENCOUNTER — Ambulatory Visit (HOSPITAL_BASED_OUTPATIENT_CLINIC_OR_DEPARTMENT_OTHER): Payer: BC Managed Care – PPO | Attending: Internal Medicine | Admitting: Radiology

## 2013-11-12 VITALS — Ht 68.0 in | Wt 178.0 lb

## 2013-11-12 DIAGNOSIS — R0981 Nasal congestion: Secondary | ICD-10-CM | POA: Insufficient documentation

## 2013-11-12 DIAGNOSIS — Z6827 Body mass index (BMI) 27.0-27.9, adult: Secondary | ICD-10-CM | POA: Insufficient documentation

## 2013-11-12 DIAGNOSIS — G4730 Sleep apnea, unspecified: Secondary | ICD-10-CM | POA: Diagnosis present

## 2013-11-12 DIAGNOSIS — R0683 Snoring: Secondary | ICD-10-CM | POA: Insufficient documentation

## 2013-11-12 DIAGNOSIS — E663 Overweight: Secondary | ICD-10-CM | POA: Diagnosis not present

## 2013-11-12 DIAGNOSIS — G4733 Obstructive sleep apnea (adult) (pediatric): Secondary | ICD-10-CM | POA: Diagnosis not present

## 2013-11-12 DIAGNOSIS — G4710 Hypersomnia, unspecified: Secondary | ICD-10-CM | POA: Diagnosis present

## 2013-11-15 ENCOUNTER — Encounter: Payer: Self-pay | Admitting: *Deleted

## 2013-11-15 NOTE — Progress Notes (Signed)
Patient ID: Barry Lopez, male   DOB: 1955-08-10, 58 y.o.   MRN: [id number] Supporting documentation faxed to [employer] as requested by patients insurance company. 09/07/2013 progress note, 09/06/2013 progress note, 09/04/2013 History and Physical.  Documentations supports dx. Of A-Fib, new onset, and need for outpatient cardiac monitoring.

## 2013-11-18 DIAGNOSIS — G4733 Obstructive sleep apnea (adult) (pediatric): Secondary | ICD-10-CM

## 2013-11-18 NOTE — Sleep Study (Signed)
NAME: Barry Lopez DATE OF BIRTH:  1955/04/06 MEDICAL RECORD NUMBER [id number]  LOCATION: [facility]  PHYSICIAN: YOUNG,CLINTON D  DATE OF STUDY: 11/12/2013  SLEEP STUDY TYPE: Nocturnal Polysomnogram               REFERRING PHYSICIAN: Philis Fendt, MD  INDICATION FOR STUDY: Hypersomnia with sleep apnea  EPWORTH SLEEPINESS SCORE:   8/24 HEIGHT: 5\' 8"  (172.7 cm)  WEIGHT: 178 lb (80.74 kg)    Body mass index is 27.07 kg/(m^2).  NECK SIZE: 15.5 in.  MEDICATIONS: Charted for review  SLEEP ARCHITECTURE: Total sleep time 323 minutes with sleep efficiency 84.4%. Stage I was 16.6%, stage II 66.1%, stage III absent, REM 17.3% of total sleep time. Sleep latency 5.5 minutes, REM latency 83 minutes, awake after sleep onset 54 minutes, arousal index 17.5, bedtime medication: None  RESPIRATORY DATA: The hypopneas index (AHI) 5.6 per hour. 30 total events scored including 3 obstructive apneas, 8 central apneas, 19 hypopneas. Events were not positional. REM AHI 12.9 per hour. There were not enough early events to permit application of split protocol CPAP titration.  OXYGEN DATA: Moderately loud snoring with oxygen desaturation to a nadir of 87% and mean saturation 96.4% on room air.  CARDIAC DATA: Sinus rhythm with PVCs  MOVEMENT/PARASOMNIA: A few incidental limb jerks were noted with no effect on sleep. Bathroom   IMPRESSION/ RECOMMENDATION:   1) Minimal obstructive sleep apnea/hypopneas syndrome, AHI 5.6 per hour. The normal range for adults is an AHI from 0-5 events per hour). Moderately loud snoring with oxygen desaturation to a nadir of 87% and mean saturation 96.4% on room air. 2) Scores in this range may often respond to conservative management including weight loss, encouragement to sleep off flat of back, and management of nasal congestion if appropriate. 3) Note the sleep disturbance indicated by 3 bathroom trips, which might reflect a treatable medical  problem    [facility], [place] [facility]  ELECTRONICALLY SIGNED ON:  11/18/2013, 3:02 PM [place] PH: [phone]   FX: [phone] [place]

## 2013-12-03 ENCOUNTER — Ambulatory Visit: Payer: BC Managed Care – PPO | Admitting: Cardiology

## 2013-12-04 ENCOUNTER — Encounter: Payer: Self-pay | Admitting: Cardiology

## 2013-12-04 ENCOUNTER — Ambulatory Visit (INDEPENDENT_AMBULATORY_CARE_PROVIDER_SITE_OTHER): Payer: BC Managed Care – PPO | Admitting: Cardiology

## 2013-12-04 VITALS — BP 140/80 | HR 75 | Ht 68.0 in | Wt 189.0 lb

## 2013-12-04 DIAGNOSIS — Z008 Encounter for other general examination: Secondary | ICD-10-CM

## 2013-12-04 DIAGNOSIS — E7800 Pure hypercholesterolemia, unspecified: Secondary | ICD-10-CM

## 2013-12-04 DIAGNOSIS — E1169 Type 2 diabetes mellitus with other specified complication: Secondary | ICD-10-CM

## 2013-12-04 DIAGNOSIS — I10 Essential (primary) hypertension: Secondary | ICD-10-CM

## 2013-12-04 DIAGNOSIS — E785 Hyperlipidemia, unspecified: Secondary | ICD-10-CM

## 2013-12-04 DIAGNOSIS — N183 Chronic kidney disease, stage 3 unspecified: Secondary | ICD-10-CM

## 2013-12-04 DIAGNOSIS — I480 Paroxysmal atrial fibrillation: Secondary | ICD-10-CM

## 2013-12-04 DIAGNOSIS — Z0189 Encounter for other specified special examinations: Secondary | ICD-10-CM

## 2013-12-04 DIAGNOSIS — I5032 Chronic diastolic (congestive) heart failure: Secondary | ICD-10-CM

## 2013-12-04 MED ORDER — CARVEDILOL 6.25 MG PO TABS: 6.2500 mg | ORAL_TABLET | Freq: Two times a day (BID) | ORAL | Status: DC

## 2013-12-04 NOTE — Patient Instructions (Signed)
Increase  Coreg to 6.25 mg twice a day    Your physician wants you to follow-up in 6 months Dr Ellyn Hack.  You will receive a reminder letter in the mail two months in advance. If you don't receive a letter, please call our office to schedule the follow-up appointment.

## 2013-12-05 ENCOUNTER — Encounter: Payer: Self-pay | Admitting: Cardiology

## 2013-12-05 DIAGNOSIS — E7800 Pure hypercholesterolemia, unspecified: Secondary | ICD-10-CM

## 2013-12-05 DIAGNOSIS — E1169 Type 2 diabetes mellitus with other specified complication: Secondary | ICD-10-CM | POA: Insufficient documentation

## 2013-12-05 NOTE — Assessment & Plan Note (Signed)
Creatinine on followup was slightly higher than previous. Once he is stable there is no concern of worsening function, we should probably consider in this to her therapy.

## 2013-12-05 NOTE — Assessment & Plan Note (Signed)
No recurrent episodes since his hospitalization. He is only on beta blocker for rate control. Anticoagulated with ELIQUIS. No bleeding concerns. If he were to have more recurrent episodes, we need to consider antiarrhythmic agents since he was so symptomatic. In the case I would recommend continuing lifelong ELIQUIS. Otherwise if he does not have any recurrent episodes of next year, we may consider the possibility of switching to just aspirin.  While on ELIQUIS, he can back off on aspirin to every other day.

## 2013-12-05 NOTE — Assessment & Plan Note (Signed)
Now on statin. His lipids look pretty good with an HDL of 50 and LDL 48. Can probably back off of Lipitor to 20 mg provided his labs remain stable in roughly 6 follow.

## 2013-12-05 NOTE — Progress Notes (Signed)
PCP: Philis Fendt, MD  Clinic Note: Chief Complaint  Patient presents with  . Follow-up    70m; no complaints   HPI: Barry Lopez is a 58 y.o. male with a PMH below who presents today for two-month followup after being seen by Estella Husk, PA for posthospital followup. He was admitted in July for her acute diastolic heart failure in the setting of atrial fibrillation. An echocardiogram was essentially normal as well as cardiac catheterization which showed mild disease in the LAD. He was somewhat bradycardic on metoprolol and therefore that was discontinued. He was discharged on office for an elevated CHA2DS2-VASC score of 3. He wore a monitor and maintain sinus rhythm. There was a short strip the extremity and was probably an artifact. He was discharged without prescription for Lasix, but was started at the time of his visit in August. He is pending an evaluation for sleep apnea.  Past Medical History  Diagnosis Date  . Type 2 diabetes mellitus treated with insulin   . Essential hypertension   . Type 2 diabetes mellitus with hypercholesterolemia   . Degenerative disc disease   . Degenerative cervical disc   . PAF (paroxysmal atrial fibrillation)   . Chronic diastolic heart failure     a. in the setting of a-fib --> Nonobstructive cath 09/06/2013 30% LAD.   2-D ECHO: Normal LV size and thickness. Mild LA dilatation. EF ~65%, no regional WMA  . CKD stage 3 due to type 2 diabetes mellitus     Cr ~1.9   Interval History: He presents today feeling relatively well. No major complaints. No further episodes of rapid or irregular heartbeats. He denies any PND, orthopnea or significant edema now is on Lasix. No chest tightness or pressure with rest or exertion. No resting or exertional dyspnea. No palpitations, lightheadedness, dizziness, weakness or syncope/near syncope. No TIA/amaurosis fugax symptoms. No melena, hematochezia, hematuria, or epstaxis. No known weight change or vision  changes. No myalgias or arthralgias.  ROS: A comprehensive was performed. Review of Systems  Constitutional: Negative for weight loss.  HENT: Negative for congestion and nosebleeds.   Respiratory: Negative for cough, hemoptysis and wheezing.   Cardiovascular: Negative.  Negative for claudication.       Per HPI  Gastrointestinal: Negative for blood in stool and melena.  Genitourinary: Negative for dysuria, frequency and hematuria.  Musculoskeletal: Negative for myalgias.  Neurological: Negative for dizziness, sensory change, speech change, focal weakness, seizures and loss of consciousness.  Endo/Heme/Allergies: Does not bruise/bleed easily.  Psychiatric/Behavioral: Negative for depression and memory loss. The patient is not nervous/anxious.   All other systems reviewed and are negative.   Current Outpatient Prescriptions on File Prior to Visit  Medication Sig Dispense Refill  . amLODipine (NORVASC) 10 MG tablet Take 0.5 tablets (5 mg total) by mouth daily.  30 tablet  0  . apixaban (ELIQUIS) 5 MG TABS tablet Take 1 tablet (5 mg total) by mouth 2 (two) times daily.  60 tablet  12  . aspirin EC 81 MG EC tablet Take 1 tablet (81 mg total) by mouth daily.  30 tablet  0  . atorvastatin (LIPITOR) 40 MG tablet Take 1 tablet (40 mg total) by mouth daily at 6 PM.  30 tablet  0  . furosemide (LASIX) 20 MG tablet Take 1 tablet (20 mg total) by mouth daily.  90 tablet  3  . glimepiride (AMARYL) 4 MG tablet Take 4 mg by mouth daily with breakfast.      .  metFORMIN (GLUCOPHAGE) 500 MG tablet Take 500 mg by mouth 2 (two) times daily with a meal.      . pregabalin (LYRICA) 100 MG capsule Take 100 mg by mouth 2 (two) times daily.       No current facility-administered medications on file prior to visit.   ALLERGIES REVIEWED IN EPIC -- no change SOCIAL AND FAMILY HISTORY REVIEWED IN EPIC -- no change  Wt Readings from Last 3 Encounters:  12/04/13 189 lb (85.73 kg)  11/12/13 178 lb (80.74 kg)    10/01/13 191 lb (86.637 kg)    PHYSICAL EXAM BP 140/80  Pulse 75  Ht 5\' 8"  (1.727 m)  Wt 189 lb (85.73 kg)  BMI 28.74 kg/m2 General appearance: alert, cooperative, appears stated age, no distress and borderline obese HEENT: [place]/AT, EOMI, MMM, anicteric sclera Neck: no adenopathy, no carotid bruit and no JVD Lungs: CTA B., normal percussion bilaterally and non-labored Heart: RRR, S1& S2 normal, no murmur, click, rub or gallop; nondisplaced PMI  Abdomen: soft, NT/ND/NABS, No HSM; No masses,  no organomegaly;  Extremities: extremities normal, atraumatic, no cyanosis, or edema Pulses: 2+ and symmetric Skin: normal Neurologic: Mental status: Alert, oriented, thought content appropriate Cranial nerves: normal (II-XII grossly intact)   Adult ECG Report  Rate: 75 ;  Rhythm: normal sinus rhythm, anterolateral T-wave inversions when mild biphasic ST and T-wave changes. Consider anterolateral ischemia, likely LVH  Narrative Interpretation: Stable EKG likely signs of LVH  Recent Labs:     Chemistry      Component Value Date/Time   NA 139 10/25/2013 1600   K 3.9 10/25/2013 1600   CL 108 10/25/2013 1600   CO2 26 10/25/2013 1600   BUN 31* 10/25/2013 1600   CREATININE 1.9* 10/25/2013 1600      Component Value Date/Time   CALCIUM 8.8 10/25/2013 1600   ALKPHOS 62 03/29/2012 0445   AST 74* 03/29/2012 0445   ALT 61* 03/29/2012 0445   BILITOT 0.7 03/29/2012 0445     Lab Results  Component Value Date   CHOL 118 09/06/2013   HDL 50 09/06/2013   LDLCALC 48 09/06/2013   TRIG 100 09/06/2013   CHOLHDL 2.4 09/06/2013     ASSESSMENT / PLAN: PAF (paroxysmal atrial fibrillation) No recurrent episodes since his hospitalization. He is only on beta blocker for rate control. Anticoagulated with ELIQUIS. No bleeding concerns. If he were to have more recurrent episodes, we need to consider antiarrhythmic agents since he was so symptomatic. In the case I would recommend continuing lifelong ELIQUIS. Otherwise if  he does not have any recurrent episodes of next year, we may consider the possibility of switching to just aspirin.  While on ELIQUIS, he can back off on aspirin to every other day.  Chronic diastolic heart failure, NYHA class 2  --  exacerbated by A. fib with RVR Really only had heart in the setting of A. fib. He does have probable hypertensive heart disease. He is on carvedilol which only increased to 25 mg twice a day he is also on amlodipine for additional blood pressure control. He is not on an ACE inhibitor because insufficiency. I would defer this to get a nephrology where his PCP. He should be on an ACE inhibitor with diabetes and hypertensive heart disease if there is no concern of potentially worsening renal function. Continue low-dose Lasix and sliding scale titration.  Essential hypertension Borderline blood pressure today. I will increase carvedilol to 25 mg twice a day. For the next agent,  an ACE inhibitor or ARB should be considered  Hyperlipidemia with target LDL less than 100 Now on statin. His lipids look pretty good with an HDL of 50 and LDL 48. Can probably back off of Lipitor to 20 mg provided his labs remain stable in roughly 6 follow.  CKD (chronic kidney disease), stage III Creatinine on followup was slightly higher than previous. Once he is stable there is no concern of worsening function, we should probably consider in this to her therapy.  Needs sleep apnea assessment Have scheduled evaluation appointment.  Type 2 diabetes mellitus with hypercholesterolemia Currently on oral medications. Monitored by PCP.    Orders Placed This Encounter  Procedures  . EKG 12-Lead   Meds ordered this encounter  Medications  . carvedilol (COREG) 6.25 MG tablet    Sig: Take 1 tablet (6.25 mg total) by mouth 2 (two) times daily.    Dispense:  180 tablet    Refill:  3    Followup: 6 months   HARDING,DAVID W, M.D., M.S. Interventional Cardiologist   Pager #  [phone]

## 2013-12-05 NOTE — Assessment & Plan Note (Signed)
Really only had heart in the setting of A. fib. He does have probable hypertensive heart disease. He is on carvedilol which only increased to 25 mg twice a day he is also on amlodipine for additional blood pressure control. He is not on an ACE inhibitor because insufficiency. I would defer this to get a nephrology where his PCP. He should be on an ACE inhibitor with diabetes and hypertensive heart disease if there is no concern of potentially worsening renal function. Continue low-dose Lasix and sliding scale titration.

## 2013-12-05 NOTE — Assessment & Plan Note (Signed)
Borderline blood pressure today. I will increase carvedilol to 25 mg twice a day. For the next agent, an ACE inhibitor or ARB should be considered

## 2013-12-05 NOTE — Assessment & Plan Note (Signed)
Currently on oral medications. Monitored by PCP.

## 2013-12-05 NOTE — Assessment & Plan Note (Signed)
Have scheduled evaluation appointment.

## 2013-12-06 ENCOUNTER — Ambulatory Visit: Payer: BC Managed Care – PPO | Admitting: Cardiology

## 2013-12-16 ENCOUNTER — Encounter (HOSPITAL_COMMUNITY): Payer: Self-pay | Admitting: Emergency Medicine

## 2013-12-16 ENCOUNTER — Emergency Department (HOSPITAL_COMMUNITY): Payer: BC Managed Care – PPO

## 2013-12-16 ENCOUNTER — Emergency Department (HOSPITAL_COMMUNITY)
Admission: EM | Admit: 2013-12-16 | Discharge: 2013-12-16 | Disposition: A | Discharge status: Home / Self Care | Payer: BC Managed Care – PPO | Attending: Emergency Medicine | Admitting: Emergency Medicine

## 2013-12-16 DIAGNOSIS — W010XXA Fall on same level from slipping, tripping and stumbling without subsequent striking against object, initial encounter: Secondary | ICD-10-CM | POA: Insufficient documentation

## 2013-12-16 DIAGNOSIS — I480 Paroxysmal atrial fibrillation: Secondary | ICD-10-CM | POA: Diagnosis not present

## 2013-12-16 DIAGNOSIS — E1169 Type 2 diabetes mellitus with other specified complication: Secondary | ICD-10-CM | POA: Diagnosis not present

## 2013-12-16 DIAGNOSIS — Y998 Other external cause status: Secondary | ICD-10-CM | POA: Insufficient documentation

## 2013-12-16 DIAGNOSIS — X58XXXA Exposure to other specified factors, initial encounter: Secondary | ICD-10-CM | POA: Diagnosis not present

## 2013-12-16 DIAGNOSIS — Z7982 Long term (current) use of aspirin: Secondary | ICD-10-CM | POA: Insufficient documentation

## 2013-12-16 DIAGNOSIS — T1490XA Injury, unspecified, initial encounter: Secondary | ICD-10-CM

## 2013-12-16 DIAGNOSIS — Y9289 Other specified places as the place of occurrence of the external cause: Secondary | ICD-10-CM | POA: Diagnosis not present

## 2013-12-16 DIAGNOSIS — I129 Hypertensive chronic kidney disease with stage 1 through stage 4 chronic kidney disease, or unspecified chronic kidney disease: Secondary | ICD-10-CM | POA: Insufficient documentation

## 2013-12-16 DIAGNOSIS — Y9339 Activity, other involving climbing, rappelling and jumping off: Secondary | ICD-10-CM | POA: Insufficient documentation

## 2013-12-16 DIAGNOSIS — S99922A Unspecified injury of left foot, initial encounter: Secondary | ICD-10-CM | POA: Diagnosis present

## 2013-12-16 DIAGNOSIS — N183 Chronic kidney disease, stage 3 (moderate): Secondary | ICD-10-CM | POA: Insufficient documentation

## 2013-12-16 DIAGNOSIS — I5032 Chronic diastolic (congestive) heart failure: Secondary | ICD-10-CM | POA: Insufficient documentation

## 2013-12-16 DIAGNOSIS — Z7902 Long term (current) use of antithrombotics/antiplatelets: Secondary | ICD-10-CM | POA: Insufficient documentation

## 2013-12-16 DIAGNOSIS — E119 Type 2 diabetes mellitus without complications: Secondary | ICD-10-CM | POA: Diagnosis not present

## 2013-12-16 DIAGNOSIS — E1122 Type 2 diabetes mellitus with diabetic chronic kidney disease: Secondary | ICD-10-CM | POA: Diagnosis not present

## 2013-12-16 DIAGNOSIS — E780 Pure hypercholesterolemia: Secondary | ICD-10-CM | POA: Insufficient documentation

## 2013-12-16 DIAGNOSIS — Z880 Allergy status to penicillin: Secondary | ICD-10-CM | POA: Insufficient documentation

## 2013-12-16 DIAGNOSIS — M79672 Pain in left foot: Secondary | ICD-10-CM

## 2013-12-16 MED ORDER — TRAMADOL HCL 50 MG PO TABS: 50.0000 mg | ORAL_TABLET | Freq: Four times a day (QID) | ORAL | Status: DC | PRN

## 2013-12-16 NOTE — ED Notes (Signed)
Pt states that he twisted his L foot 2 days ago and now has pain in the medial aspect. Alert and oriented.

## 2013-12-16 NOTE — ED Provider Notes (Signed)
CSN: [id number]     Arrival date & time 12/16/13  1524 History   First MD Initiated Contact with Patient 12/16/13 1701     Chief Complaint  Patient presents with  . Foot Injury     (Consider location/radiation/quality/duration/timing/severity/associated sxs/prior Treatment) HPI QUENTAVIUS HUESTON is a 58 y.o. male with a history of diabetes and [facility] ED physician for evaluation of left foot pain. Patient states 2 days ago he was climbing up onto a landing his in his left foot slipped. He did not have any pain at the time but noticed last night after getting out of the shower he had pain at the base of his left great toe. Denies any history of gout. Denies any numbness, weakness or tingling at this time. He is ambulatory.  Denies fevers, chills.  Past Medical History  Diagnosis Date  . Type 2 diabetes mellitus treated with insulin   . Essential hypertension   . Type 2 diabetes mellitus with hypercholesterolemia   . Degenerative disc disease   . Degenerative cervical disc   . PAF (paroxysmal atrial fibrillation)   . Chronic diastolic heart failure     a. in the setting of a-fib --> Nonobstructive cath 09/06/2013 30% LAD.   2-D ECHO: Normal LV size and thickness. Mild LA dilatation. EF ~65%, no regional WMA  . CKD stage 3 due to type 2 diabetes mellitus     Cr ~1.9   Past Surgical History  Procedure Laterality Date  . Bowel resection    . Neck surgery    . Tonsillectomy    . Hernia repair      YEARS AGO  . Cholecystectomy    . Anterior cervical decomp/discectomy fusion  12/17/2011    Procedure: ANTERIOR CERVICAL DECOMPRESSION/DISCECTOMY FUSION 1 LEVEL;  Surgeon: Erline Levine, MD;  Location: [facility];  Service: Neurosurgery;  Laterality: N/A;  Exploration of Fusion and Anterior Cervical Six-Seven Decompression/Diskectomy/Fusion  . Posterior cervical fusion/foraminotomy  12/17/2011    Procedure: POSTERIOR CERVICAL FUSION/FORAMINOTOMY LEVEL 4;  Surgeon: Erline Levine, MD;  Location: [facility];  Service: Neurosurgery;;  Posterior Cervical Three-Seven Fusion  . Cardiac catheterization  09/06/2013    30% LAD. Otherwise nonobstructive CAD   Family History  Problem Relation Age of Onset  . Heart failure Mother   . Diabetes Mellitus II Mother   . Diabetes Mellitus II Sister    History  Substance Use Topics  . Smoking status: Never Smoker   . Smokeless tobacco: Never Used  . Alcohol Use: No    Review of Systems  Constitutional: Negative for fever.  Respiratory: Negative for shortness of breath.   Cardiovascular: Negative for chest pain.  Musculoskeletal: Positive for arthralgias.  Skin: Negative for rash.      Allergies  Penicillins  Home Medications   Prior to Admission medications   Medication Sig Start Date End Date Taking? Authorizing Provider  amLODipine (NORVASC) 10 MG tablet Take 0.5 tablets (5 mg total) by mouth daily. 09/07/13   Allie Bossier, MD  apixaban (ELIQUIS) 5 MG TABS tablet Take 1 tablet (5 mg total) by mouth 2 (two) times daily. 10/01/13   Imogene Burn, PA-C  aspirin EC 81 MG EC tablet Take 1 tablet (81 mg total) by mouth daily. 09/07/13   Allie Bossier, MD  atorvastatin (LIPITOR) 40 MG tablet Take 1 tablet (40 mg total) by mouth daily at 6 PM. 09/07/13   Allie Bossier, MD  carvedilol (COREG) 6.25 MG tablet Take  1 tablet (6.25 mg total) by mouth 2 (two) times daily. 12/04/13   Leonie Man, MD  furosemide (LASIX) 20 MG tablet Take 1 tablet (20 mg total) by mouth daily. 10/01/13   Imogene Burn, PA-C  glimepiride (AMARYL) 4 MG tablet Take 4 mg by mouth daily with breakfast.    Historical Provider, MD  metFORMIN (GLUCOPHAGE) 500 MG tablet Take 500 mg by mouth 2 (two) times daily with a meal.    Historical Provider, MD  ONE TOUCH ULTRA TEST test strip  09/25/13   Historical Provider, MD  prazosin (MINIPRESS) 2 MG capsule Take 1 capsule by mouth daily. 11/07/13   Historical Provider, MD  pregabalin (LYRICA) 100 MG capsule Take 100 mg by mouth 2  (two) times daily.    Historical Provider, MD   BP 163/83 mmHg  Pulse 75  Temp(Src) 98.2 F (36.8 C) (Oral)  SpO2 100% Physical Exam  Constitutional: He appears well-developed and well-nourished. No distress.  Awake, alert, nontoxic appearance.  HENT:  Head: Atraumatic.  Eyes: Conjunctivae and EOM are normal. Right eye exhibits no discharge. Left eye exhibits no discharge.  Neck: Neck supple. No JVD present. No tracheal deviation present.  Pulmonary/Chest: Effort normal. He exhibits no tenderness.  Abdominal: Soft. There is no tenderness. There is no rebound.  Musculoskeletal: He exhibits no tenderness.  Baseline ROM, no obvious new focal weakness. No overt swelling, warmth or erythema to left great toe. There is mild tenderness on the inferior medial aspect of the first MTP. Distal pulses intact no other obvious lesions, rashes or other deformities appreciated Gait is somewhat antalgic but without any appreciable ataxia  Neurological:  Mental status and motor strength appears baseline for patient and situation.  Skin: Skin is warm and dry. No rash noted. He is not diaphoretic.  Psychiatric: He has a normal mood and affect.  Nursing note and vitals reviewed.   ED Course  Procedures (including critical care time) Labs Review Labs Reviewed - No data to display  Imaging Review Dg Foot Complete Left  12/16/2013   CLINICAL DATA:  Medial left foot pain following a nonspecified injury 2 days ago.  EXAM: LEFT FOOT - COMPLETE 3+ VIEW  COMPARISON:  None.  FINDINGS: Atheromatous arterial calcifications. No fracture or dislocation seen.  IMPRESSION: Atheromatous arterial calcifications. Otherwise, normal examination.   Electronically Signed   By: Enrique Sack M.D.   On: 12/16/2013 17:03     EKG Interpretation None      MDM  Vitals stable - WNL -afebrile, no tachypnea noted on exam Pt resting comfortably in ED. Able to ambulate all 4 extremities without any apparent  difficulty. PE--not concerning for acute or emergent pathology. Low concern for hemarthrosis, septic joint, gout. Imaging--x-ray shows no acute fractures, dislocations or other osseous abnormalities Foot pain likely due to sprain, turf toe. Encouraged symptomatic treatment.  Will DC with tramadol for pain management Discussed f/u with PCP and return precautions, pt very amenable to plan. Patient stable, in good condition and is appropriate for discharge  Prior to patient discharge, I discussed and reviewed this case with Dr.Walden  Final diagnoses:  Foot pain, left        Verl Dicker, PA-C 12/16/13 [place], MD 12/16/13 [phone]

## 2014-01-14 ENCOUNTER — Ambulatory Visit: Payer: BC Managed Care – PPO | Admitting: Cardiology

## 2014-01-17 ENCOUNTER — Encounter (HOSPITAL_COMMUNITY): Payer: Self-pay | Admitting: Cardiology

## 2014-06-06 ENCOUNTER — Ambulatory Visit (INDEPENDENT_AMBULATORY_CARE_PROVIDER_SITE_OTHER): Payer: BLUE CROSS/BLUE SHIELD | Admitting: Cardiology

## 2014-06-06 ENCOUNTER — Encounter: Payer: Self-pay | Admitting: Cardiology

## 2014-06-06 ENCOUNTER — Emergency Department (HOSPITAL_COMMUNITY)
Admission: EM | Admit: 2014-06-06 | Discharge: 2014-06-07 | Disposition: A | Discharge status: Home / Self Care | Payer: BLUE CROSS/BLUE SHIELD | Attending: Emergency Medicine | Admitting: Emergency Medicine

## 2014-06-06 ENCOUNTER — Encounter (HOSPITAL_COMMUNITY): Payer: Self-pay

## 2014-06-06 VITALS — BP 156/78 | HR 72 | Ht 68.0 in | Wt 189.7 lb

## 2014-06-06 DIAGNOSIS — I129 Hypertensive chronic kidney disease with stage 1 through stage 4 chronic kidney disease, or unspecified chronic kidney disease: Secondary | ICD-10-CM | POA: Insufficient documentation

## 2014-06-06 DIAGNOSIS — N183 Chronic kidney disease, stage 3 unspecified: Secondary | ICD-10-CM

## 2014-06-06 DIAGNOSIS — I5032 Chronic diastolic (congestive) heart failure: Secondary | ICD-10-CM | POA: Insufficient documentation

## 2014-06-06 DIAGNOSIS — E780 Pure hypercholesterolemia: Secondary | ICD-10-CM | POA: Diagnosis not present

## 2014-06-06 DIAGNOSIS — E785 Hyperlipidemia, unspecified: Secondary | ICD-10-CM | POA: Diagnosis not present

## 2014-06-06 DIAGNOSIS — I480 Paroxysmal atrial fibrillation: Secondary | ICD-10-CM

## 2014-06-06 DIAGNOSIS — Z7982 Long term (current) use of aspirin: Secondary | ICD-10-CM | POA: Insufficient documentation

## 2014-06-06 DIAGNOSIS — Z7902 Long term (current) use of antithrombotics/antiplatelets: Secondary | ICD-10-CM | POA: Diagnosis not present

## 2014-06-06 DIAGNOSIS — I10 Essential (primary) hypertension: Secondary | ICD-10-CM

## 2014-06-06 DIAGNOSIS — Z79899 Other long term (current) drug therapy: Secondary | ICD-10-CM | POA: Diagnosis not present

## 2014-06-06 DIAGNOSIS — Z880 Allergy status to penicillin: Secondary | ICD-10-CM | POA: Insufficient documentation

## 2014-06-06 DIAGNOSIS — M79602 Pain in left arm: Secondary | ICD-10-CM | POA: Diagnosis present

## 2014-06-06 DIAGNOSIS — M5412 Radiculopathy, cervical region: Secondary | ICD-10-CM

## 2014-06-06 DIAGNOSIS — E1122 Type 2 diabetes mellitus with diabetic chronic kidney disease: Secondary | ICD-10-CM | POA: Insufficient documentation

## 2014-06-06 NOTE — Progress Notes (Signed)
PCP: Philis Fendt, MD  Clinic Note: Chief Complaint  Patient presents with  . Follow-up    left arm pain,no other concerns  . Hypertension  . Congestive Heart Failure    Diastolic  . Atrial Fibrillation    Paroxysmal   HPI: Barry Lopez is a 59 y.o. male with a PMH below who presents today for 53-month followup for HTN, PAF & Diastolic HF. He was admitted in July 2015 for  acute diastolic heart failure in the setting of atrial fibrillation.  An echocardiogram was essentially normal as well as cardiac catheterization which showed mild disease in the LAD. He was somewhat bradycardic on metoprolol and therefore that was discontinued. He was discharged on office for an elevated CHA2DS2-VASC score of 3.  I last saw him in October 2015 - 3 well without any major complaints. Has not had any further CHF versus episodes of A. Fib.  His primary care physician stopped amlodipine because of edema.  Past Medical History  Diagnosis Date  . Essential hypertension   . Type 2 diabetes mellitus with hypercholesterolemia   . Degenerative disc disease   . Degenerative cervical disc   . PAF (paroxysmal atrial fibrillation)   . Chronic diastolic heart failure     a. in the setting of a-fib --> Nonobstructive cath 09/06/2013 30% LAD.   2-D ECHO: Normal LV size and thickness. Mild LA dilatation. EF ~65%, no regional WMA  . CKD stage 3 due to type 2 diabetes mellitus     Cr ~1.9   Interval History: He presents today feeling relatively well. No major complaints. No further episodes of rapid or irregular heartbeats. He denies any PND, orthopnea or significant edema now is on Lasix. No chest tightness or pressure with rest or exertion. He does have occasional exertional dyspnea when working out, but this is not unexpected. Otherwise no dyspnea with routine exertion. No palpitations, lightheadedness, dizziness, weakness or syncope/near syncope. No TIA/amaurosis fugax symptoms. No melena, hematochezia,  hematuria, or epstaxis. No known weight change or vision changes. No myalgias or arthralgias.  ROS: A comprehensive was performed. Review of Systems  Constitutional: Negative for malaise/fatigue.  Respiratory: Negative.   Gastrointestinal: Negative for heartburn and abdominal pain.  Musculoskeletal:       Left arm pain mostly in his bicep made worse with extension and pronation  Endo/Heme/Allergies: Does not bruise/bleed easily.  All other systems reviewed and are negative.   Current Outpatient Prescriptions on File Prior to Visit  Medication Sig Dispense Refill  . apixaban (ELIQUIS) 5 MG TABS tablet Take 1 tablet (5 mg total) by mouth 2 (two) times daily. 60 tablet 12  . aspirin EC 81 MG EC tablet Take 1 tablet (81 mg total) by mouth daily. 30 tablet 0  . atorvastatin (LIPITOR) 40 MG tablet Take 1 tablet (40 mg total) by mouth daily at 6 PM. (Patient not taking: Reported on 06/06/2014) 30 tablet 0  . carvedilol (COREG) 6.25 MG tablet Take 1 tablet (6.25 mg total) by mouth 2 (two) times daily. 180 tablet 3  . furosemide (LASIX) 20 MG tablet Take 1 tablet (20 mg total) by mouth daily. (Patient not taking: Reported on 06/06/2014) 90 tablet 3  . glimepiride (AMARYL) 4 MG tablet Take 4 mg by mouth daily with breakfast.    . ONE TOUCH ULTRA TEST test strip     . pregabalin (LYRICA) 100 MG capsule Take 100 mg by mouth 2 (two) times daily.    . traMADol (ULTRAM) 50  MG tablet Take 1 tablet (50 mg total) by mouth every 6 (six) hours as needed. (Patient not taking: Reported on 06/06/2014) 15 tablet 0   No current facility-administered medications on file prior to visit.   Allergies  Allergen Reactions  . Penicillins Hives and Itching   Family History  Problem Relation Age of Onset  . Heart failure Mother   . Diabetes Mellitus II Mother   . Diabetes Mellitus II Sister    History  Substance Use Topics  . Smoking status: Never Smoker   . Smokeless tobacco: Never Used  . Alcohol Use: No     Wt Readings from Last 3 Encounters:  06/06/14 189 lb 11.2 oz (86.047 kg)  12/04/13 189 lb (85.73 kg)  11/12/13 178 lb (80.74 kg)    PHYSICAL EXAM BP 156/78 mmHg  Pulse 72  Ht 5\' 8"  (1.727 m)  Wt 189 lb 11.2 oz (86.047 kg)  BMI 28.85 kg/m2 General appearance: alert, cooperative, appears stated age, no distress and borderline obese HEENT: [place]/AT, EOMI, MMM, anicteric sclera Neck: no adenopathy, no carotid bruit and no JVD Lungs: CTA B., normal percussion bilaterally and non-labored Heart: RRR, S1& S2 normal, no murmur, click, rub or gallop; nondisplaced PMI  Abdomen: soft, NT/ND/NABS, No HSM; No masses,  no organomegaly;  Extremities: extremities normal, atraumatic, no cyanosis, or edema Pulses: 2+ and symmetric Skin: normal Neurologic: Mental status: Alert, oriented, thought content appropriate Cranial nerves: normal (II-XII grossly intact)   Adult ECG Report  Rate: 72 ;  Rhythm: normal sinus rhythm, anterolateral T-wave inversions when mild biphasic ST and T-wave changes. Consider anterolateral ischemia, likely LVH  Narrative Interpretation: Stable EKG likely signs of LVH  Recent Labs:     Chemistry      Component Value Date/Time   NA 139 10/25/2013 1600   K 3.9 10/25/2013 1600   CL 108 10/25/2013 1600   CO2 26 10/25/2013 1600   BUN 31* 10/25/2013 1600   CREATININE 1.9* 10/25/2013 1600      Component Value Date/Time   CALCIUM 8.8 10/25/2013 1600   ALKPHOS 62 03/29/2012 0445   AST 74* 03/29/2012 0445   ALT 61* 03/29/2012 0445   BILITOT 0.7 03/29/2012 0445     Lab Results  Component Value Date   CHOL 118 09/06/2013   HDL 50 09/06/2013   LDLCALC 48 09/06/2013   TRIG 100 09/06/2013   CHOLHDL 2.4 09/06/2013     ASSESSMENT / PLAN: Problem List Items Addressed This Visit    Chronic diastolic heart failure, NYHA class 2  --  exacerbated by A. fib with RVR - Primary (Chronic)    No active or to her symptoms. My examination is to leave his medications alone. He  is on a stable dose of carvedilol - but his blood pressure is definitely higher off of amlodipine. He should be on ACE inhibitor or ARB with diabetes and hypertensive heart disease but this is not been started because of his renal insufficiency. I deferred this to his nephrologist so I will continue to allow him to adjust medications. He is on a standing dose of Lasix with sliding scale recommendations.      Relevant Orders   EKG 12-Lead (Completed)   CKD (chronic kidney disease), stage III (Chronic)   Relevant Orders   EKG 12-Lead (Completed)   Essential hypertension (Chronic)    I thought his carvedilol was supposed to be 25 mg twice a day but this is not where he currently is on. We will  try to increased to 12.5 mg daily. Since he is no longer taking amlodipine, we need to strongly consider ACE inhibitor or ARB. Since I have not seen the labs on him I will defer this to his PCP.      Relevant Orders   EKG 12-Lead (Completed)   Hyperlipidemia with target LDL less than 100 (Chronic)    On moderate dose of Lipitor. Had relatively well-controlled lipids the past. Monitored by PCP.      Relevant Orders   EKG 12-Lead (Completed)   PAF (paroxysmal atrial fibrillation) (Chronic)    As far as I can tell. No recurrence since his hospitalization. Remains on carvedilol.. Can take an additional dose if need be for rate control.  I think we can increase his dose to 12.5 mg daily (we will need to contact him to let him know of this recommendation).  He is on Eliquis without any bleeding issues.      Relevant Orders   EKG 12-Lead (Completed)      Followup: 6 months   Shataria Crist, Leonie Green, M.D., M.S. Interventional Cardiologist   Pager # [phone]

## 2014-06-06 NOTE — ED Notes (Signed)
Pt presents with c/o left arm pain that started approx one week ago. Pt reports the pain is in the left side of his neck and goes down his left arm to his fingers. No c/o chest pain at this time. NAD in triage.

## 2014-06-06 NOTE — Patient Instructions (Signed)
IF HEART RATE INCREASE YOU MAY TAKE AN EXTRA DOSE OF CARVEDILOL  NO CHANGE IN CURRENT MEDICATION   Your physician wants you to follow-up in 6 MONTH Dr Ellyn Hack.  You will receive a reminder letter in the mail two months in advance. If you don't receive a letter, please call our office to schedule the follow-up appointment.

## 2014-06-07 MED ORDER — OXYCODONE-ACETAMINOPHEN 5-325 MG PO TABS
1.0000 | ORAL_TABLET | ORAL | Status: DC | PRN
Start: 2014-06-07 — End: 2014-07-24

## 2014-06-07 MED ORDER — OXYCODONE-ACETAMINOPHEN 5-325 MG PO TABS
1.0000 | ORAL_TABLET | Freq: Once | ORAL | Status: AC
Start: 2014-06-07 — End: 2014-06-07
  Administered 2014-06-07: 1 via ORAL
  Filled 2014-06-07: qty 1

## 2014-06-07 NOTE — Discharge Instructions (Signed)

## 2014-06-07 NOTE — ED Provider Notes (Signed)
CSN: [id number]     Arrival date & time 06/06/14  2023 History   First MD Initiated Contact with Patient 06/07/14 0007     Chief Complaint  Patient presents with  . Arm Pain     (Consider location/radiation/quality/duration/timing/severity/associated sxs/prior Treatment) Patient is a 59 y.o. male presenting with arm pain. The history is provided by the patient. No language interpreter was used.  Arm Pain This is a new problem. The current episode started 1 to 4 weeks ago. Pertinent negatives include no chills, fever, numbness or weakness. Associated symptoms comments: He has neck pain with pain radiating to left arm along dorsal aspect. He denies perceived weakness in the left hand and is not dropping objects. No numbness. No chest pain or SOB. He has had 2 cervical surgeries, one for discectomy that had similar radiating symptoms into the right arm. His neurosurgeon is Dr. Vertell Limber..    Past Medical History  Diagnosis Date  . Essential hypertension   . Type 2 diabetes mellitus with hypercholesterolemia   . Degenerative disc disease   . Degenerative cervical disc   . PAF (paroxysmal atrial fibrillation)   . Chronic diastolic heart failure     a. in the setting of a-fib --> Nonobstructive cath 09/06/2013 30% LAD.   2-D ECHO: Normal LV size and thickness. Mild LA dilatation. EF ~65%, no regional WMA  . CKD stage 3 due to type 2 diabetes mellitus     Cr ~1.9   Past Surgical History  Procedure Laterality Date  . Bowel resection    . Neck surgery    . Tonsillectomy    . Hernia repair      YEARS AGO  . Cholecystectomy    . Anterior cervical decomp/discectomy fusion  12/17/2011    Procedure: ANTERIOR CERVICAL DECOMPRESSION/DISCECTOMY FUSION 1 LEVEL;  Surgeon: Erline Levine, MD;  Location: [place] [facility]URO ORS;  Service: Neurosurgery;  Laterality: N/A;  Exploration of Fusion and Anterior Cervical Six-Seven Decompression/Diskectomy/Fusion  . Posterior cervical fusion/foraminotomy  12/17/2011   Procedure: POSTERIOR CERVICAL FUSION/FORAMINOTOMY LEVEL 4;  Surgeon: Erline Levine, MD;  Location: [facility];  Service: Neurosurgery;;  Posterior Cervical Three-Seven Fusion  . Cardiac catheterization  09/06/2013    30% LAD. Otherwise nonobstructive CAD  . Left heart catheterization with coronary angiogram N/A 09/06/2013    Procedure: LEFT HEART CATHETERIZATION WITH CORONARY ANGIOGRAM;  Surgeon: Leonie Man, MD;  Location: [facility] CATH LAB;  Service: Cardiovascular;  Laterality: N/A;   Family History  Problem Relation Age of Onset  . Heart failure Mother   . Diabetes Mellitus II Mother   . Diabetes Mellitus II Sister    History  Substance Use Topics  . Smoking status: Never Smoker   . Smokeless tobacco: Never Used  . Alcohol Use: No    Review of Systems  Constitutional: Negative for fever and chills.  Respiratory: Negative.   Cardiovascular: Negative.   Gastrointestinal: Negative.   Musculoskeletal: Negative.   Skin: Negative.   Neurological: Negative.  Negative for weakness and numbness.      Allergies  Penicillins  Home Medications   Prior to Admission medications   Medication Sig Start Date End Date Taking? Authorizing Provider  apixaban (ELIQUIS) 5 MG TABS tablet Take 1 tablet (5 mg total) by mouth 2 (two) times daily. 10/01/13  Yes Imogene Burn, PA-C  aspirin EC 81 MG EC tablet Take 1 tablet (81 mg total) by mouth daily. 09/07/13  Yes Allie Bossier, MD  carvedilol (COREG) 6.25 MG tablet  Take 1 tablet (6.25 mg total) by mouth 2 (two) times daily. 12/04/13  Yes Leonie Man, MD  Elbasvir-Grazoprevir 50-100 MG TABS Take 1 tablet by mouth daily.   Yes Historical Provider, MD  glimepiride (AMARYL) 4 MG tablet Take 4 mg by mouth daily with breakfast.   Yes Historical Provider, MD  pregabalin (LYRICA) 100 MG capsule Take 100 mg by mouth 2 (two) times daily.   Yes Historical Provider, MD  VOLTAREN 1 % GEL Apply 1 application topically as needed (for pain).  03/05/14  Yes  Historical Provider, MD  atorvastatin (LIPITOR) 40 MG tablet Take 1 tablet (40 mg total) by mouth daily at 6 PM. Patient not taking: Reported on 06/06/2014 09/07/13   Allie Bossier, MD  furosemide (LASIX) 20 MG tablet Take 1 tablet (20 mg total) by mouth daily. Patient not taking: Reported on 06/06/2014 10/01/13   Imogene Burn, PA-C  ONE TOUCH ULTRA TEST test strip  09/25/13   Historical Provider, MD  oxyCODONE-acetaminophen (PERCOCET/ROXICET) 5-325 MG per tablet Take 1-2 tablets by mouth every 4 (four) hours as needed for severe pain. 06/07/14   Charlann Lange, PA-C  traMADol (ULTRAM) 50 MG tablet Take 1 tablet (50 mg total) by mouth every 6 (six) hours as needed. Patient not taking: Reported on 06/06/2014 12/16/13   Comer Locket, PA-C   BP 160/94 mmHg  Pulse 73  Temp(Src) 98.7 F (37.1 C) (Oral)  Resp 22  SpO2 97% Physical Exam  Constitutional: He is oriented to person, place, and time. He appears well-developed and well-nourished.  Neck: Normal range of motion.  Cardiovascular: Intact distal pulses.   Pulmonary/Chest: Effort normal.  Musculoskeletal: Normal range of motion.  No midline or paracervical tenderness. Grip strength in UE's 5/5 and equal. No swelling or discoloration of the left arm.   Neurological: He is alert and oriented to person, place, and time.  Equal reflexes in the UE's  Skin: Skin is warm and dry.  Psychiatric: He has a normal mood and affect.    ED Course  Procedures (including critical care time) Labs Review Labs Reviewed - No data to display  Imaging Review No results found.   EKG Interpretation   Date/Time:  Thursday June 06 2014 20:39:41 EDT Ventricular Rate:  79 PR Interval:  162 QRS Duration: 92 QT Interval:  374 QTC Calculation: 429 R Axis:   42 Text Interpretation:  Sinus rhythm Probable left atrial enlargement RSR'  in V1 or V2, right VCD or RVH Abnrm T, consider ischemia, anterolateral  lds Baseline wander in lead(s) II aVF V1 No  significant change since last  tracing Confirmed by Maryan Rued  MD, Loree Fee ([id number]) on 06/06/2014 8:43:29 PM      MDM   Final diagnoses:  Cervical radicular pain    Outpatient cervical MR scheduled at [facility] for tomorrow in anticipation of his follow up with Dr. Vertell Limber.    Charlann Lange, PA-C 06/07/14 0111  Rolland Porter, MD 06/07/14 [phone]

## 2014-06-08 NOTE — Assessment & Plan Note (Signed)
I thought his carvedilol was supposed to be 25 mg twice a day but this is not where he currently is on. We will try to increased to 12.5 mg daily. Since he is no longer taking amlodipine, we need to strongly consider ACE inhibitor or ARB. Since I have not seen the labs on him I will defer this to his PCP.

## 2014-06-08 NOTE — Assessment & Plan Note (Signed)
On moderate dose of Lipitor. Had relatively well-controlled lipids the past. Monitored by PCP.

## 2014-06-08 NOTE — Assessment & Plan Note (Signed)
No active or to her symptoms. My examination is to leave his medications alone. He is on a stable dose of carvedilol - but his blood pressure is definitely higher off of amlodipine. He should be on ACE inhibitor or ARB with diabetes and hypertensive heart disease but this is not been started because of his renal insufficiency. I deferred this to his nephrologist so I will continue to allow him to adjust medications. He is on a standing dose of Lasix with sliding scale recommendations.

## 2014-06-08 NOTE — Assessment & Plan Note (Signed)
As far as I can tell. No recurrence since his hospitalization. Remains on carvedilol.. Can take an additional dose if need be for rate control.  I think we can increase his dose to 12.5 mg daily (we will need to contact him to let him know of this recommendation).  He is on Eliquis without any bleeding issues.

## 2014-07-24 ENCOUNTER — Emergency Department (HOSPITAL_COMMUNITY): Payer: BLUE CROSS/BLUE SHIELD

## 2014-07-24 ENCOUNTER — Encounter (HOSPITAL_COMMUNITY): Payer: Self-pay | Admitting: *Deleted

## 2014-07-24 ENCOUNTER — Emergency Department (HOSPITAL_COMMUNITY)
Admission: EM | Admit: 2014-07-24 | Discharge: 2014-07-24 | Disposition: A | Discharge status: Home / Self Care | Payer: BLUE CROSS/BLUE SHIELD | Attending: Emergency Medicine | Admitting: Emergency Medicine

## 2014-07-24 DIAGNOSIS — M5412 Radiculopathy, cervical region: Secondary | ICD-10-CM | POA: Diagnosis not present

## 2014-07-24 DIAGNOSIS — Z9889 Other specified postprocedural states: Secondary | ICD-10-CM | POA: Diagnosis not present

## 2014-07-24 DIAGNOSIS — N183 Chronic kidney disease, stage 3 (moderate): Secondary | ICD-10-CM | POA: Insufficient documentation

## 2014-07-24 DIAGNOSIS — R059 Cough, unspecified: Secondary | ICD-10-CM

## 2014-07-24 DIAGNOSIS — R51 Headache: Secondary | ICD-10-CM | POA: Insufficient documentation

## 2014-07-24 DIAGNOSIS — I480 Paroxysmal atrial fibrillation: Secondary | ICD-10-CM | POA: Diagnosis not present

## 2014-07-24 DIAGNOSIS — Z8619 Personal history of other infectious and parasitic diseases: Secondary | ICD-10-CM | POA: Insufficient documentation

## 2014-07-24 DIAGNOSIS — R05 Cough: Secondary | ICD-10-CM | POA: Insufficient documentation

## 2014-07-24 DIAGNOSIS — Z7901 Long term (current) use of anticoagulants: Secondary | ICD-10-CM | POA: Diagnosis not present

## 2014-07-24 DIAGNOSIS — E1122 Type 2 diabetes mellitus with diabetic chronic kidney disease: Secondary | ICD-10-CM | POA: Insufficient documentation

## 2014-07-24 DIAGNOSIS — M79602 Pain in left arm: Secondary | ICD-10-CM | POA: Diagnosis not present

## 2014-07-24 DIAGNOSIS — Z880 Allergy status to penicillin: Secondary | ICD-10-CM | POA: Diagnosis not present

## 2014-07-24 DIAGNOSIS — M79605 Pain in left leg: Secondary | ICD-10-CM | POA: Diagnosis not present

## 2014-07-24 DIAGNOSIS — Z7982 Long term (current) use of aspirin: Secondary | ICD-10-CM | POA: Diagnosis not present

## 2014-07-24 DIAGNOSIS — R079 Chest pain, unspecified: Secondary | ICD-10-CM | POA: Insufficient documentation

## 2014-07-24 DIAGNOSIS — Z79899 Other long term (current) drug therapy: Secondary | ICD-10-CM | POA: Insufficient documentation

## 2014-07-24 DIAGNOSIS — I5032 Chronic diastolic (congestive) heart failure: Secondary | ICD-10-CM | POA: Insufficient documentation

## 2014-07-24 DIAGNOSIS — R519 Headache, unspecified: Secondary | ICD-10-CM

## 2014-07-24 DIAGNOSIS — J029 Acute pharyngitis, unspecified: Secondary | ICD-10-CM | POA: Diagnosis not present

## 2014-07-24 DIAGNOSIS — I129 Hypertensive chronic kidney disease with stage 1 through stage 4 chronic kidney disease, or unspecified chronic kidney disease: Secondary | ICD-10-CM | POA: Diagnosis not present

## 2014-07-24 LAB — BASIC METABOLIC PANEL
Anion gap: 9 (ref 5–15)
BUN: 27 mg/dL — ABNORMAL HIGH (ref 6–20)
CO2: 22 mmol/L (ref 22–32)
Calcium: 8.8 mg/dL — ABNORMAL LOW (ref 8.9–10.3)
Chloride: 106 mmol/L (ref 101–111)
Creatinine, Ser: 1.88 mg/dL — ABNORMAL HIGH (ref 0.61–1.24)
GFR calc Af Amer: 44 mL/min — ABNORMAL LOW (ref >60)
GFR calc non Af Amer: 38 mL/min — ABNORMAL LOW (ref >60)
Glucose, Bld: 119 mg/dL — ABNORMAL HIGH (ref 65–99)
Potassium: 3.8 mmol/L (ref 3.5–5.1)
Sodium: 137 mmol/L (ref 135–145)

## 2014-07-24 LAB — CBC
HCT: 40.7 % (ref 39.0–52.0)
Hemoglobin: 14.5 g/dL (ref 13.0–17.0)
MCH: 26.7 pg (ref 26.0–34.0)
MCHC: 35.6 g/dL (ref 30.0–36.0)
MCV: 75 fL — ABNORMAL LOW (ref 78.0–100.0)
Platelets: 125 10*3/uL — ABNORMAL LOW (ref 150–400)
RBC: 5.43 MIL/uL (ref 4.22–5.81)
RDW: 12.9 % (ref 11.5–15.5)
WBC: 4.2 10*3/uL (ref 4.0–10.5)

## 2014-07-24 LAB — I-STAT TROPONIN, ED: Troponin i, poc: 0.01 ng/mL (ref 0.00–0.08)

## 2014-07-24 MED ORDER — BENZONATATE 100 MG PO CAPS: 100.0000 mg | ORAL_CAPSULE | Freq: Three times a day (TID) | ORAL | Status: DC

## 2014-07-24 MED ORDER — METOCLOPRAMIDE HCL 5 MG/ML IJ SOLN
10.0000 mg | Freq: Once | INTRAMUSCULAR | Status: AC
  Administered 2014-07-24: 10 mg via INTRAVENOUS
  Filled 2014-07-24: qty 2

## 2014-07-24 MED ORDER — KETOROLAC TROMETHAMINE 30 MG/ML IJ SOLN
30.0000 mg | Freq: Once | INTRAMUSCULAR | Status: AC
  Administered 2014-07-24: 30 mg via INTRAVENOUS
  Filled 2014-07-24: qty 1

## 2014-07-24 MED ORDER — DIPHENHYDRAMINE HCL 50 MG/ML IJ SOLN
25.0000 mg | Freq: Once | INTRAMUSCULAR | Status: AC
  Administered 2014-07-24: 25 mg via INTRAVENOUS
  Filled 2014-07-24: qty 1

## 2014-07-24 MED ORDER — SODIUM CHLORIDE 0.9 % IV BOLUS (SEPSIS)
1000.0000 mL | Freq: Once | INTRAVENOUS | Status: AC
  Administered 2014-07-24: 1000 mL via INTRAVENOUS

## 2014-07-24 MED ORDER — BENZONATATE 100 MG PO CAPS
100.0000 mg | ORAL_CAPSULE | Freq: Once | ORAL | Status: AC
  Administered 2014-07-24: 100 mg via ORAL
  Filled 2014-07-24: qty 1

## 2014-07-24 NOTE — Discharge Instructions (Signed)
Please call your doctor for a followup appointment within 24-48 hours. When you talk to your doctor please let them know that you were seen in the emergency department and have them acquire all of your records so that they can discuss the findings with you and formulate a treatment plan to fully care for your new and ongoing problems. ° °

## 2014-07-24 NOTE — ED Notes (Signed)
Pt reports h/a x 4 days.  Pt also reports L arm pain x 5 months and has been seen last month for same.  States EDP wanted to put him on prednisone but he cannot take it.

## 2014-07-24 NOTE — ED Notes (Signed)
Patient ready for discharge, started with complaints of chest pain and left arm pain 8/10. Dr Sabra Heck informed by Barry Lopez NT, states that it is ok to discharge due to chronic pain. EKG normal, pulse normal, BP 154/89. AAO x4.

## 2014-07-24 NOTE — ED Provider Notes (Addendum)
CSN: [id number]     Arrival date & time 07/24/14  0600 History   First MD Initiated Contact with Patient 07/24/14 [phone]     Chief Complaint  Patient presents with  . Headache     (Consider location/radiation/quality/duration/timing/severity/associated sxs/prior Treatment) HPI Comments: The pt is a 59 y/o male - has had his care at the [place] as well as recent surgery with neurosurgery on the R neck (due to R arm symptoms), he complains of multiple complaints:  #1 - has had a headache for approximately 4 days, this is bilateral temporal throbbing, intermittent, nothing seems to make this better or worse, he states that he has been taking Tylenol with minimal relief, there is no changes in vision, no weakness or numbness of the arms or the legs, no difficulty with walking, the headache is similar to prior headaches in location and quality but this has had a much longer duration than normal.  #2 left arm and left leg pain. He states that he has had this for approximately 5 months, it seems to be progressively gradually worsening, it starts from his neck and moves down into the shoulder and the arm as well as from the lower back and into the left leg, he is able to ambulate and able to perform his job though he notes that the pain is getting worse. He denies any weakness in the arm or the leg.  #3 cough with chest pain when he coughs, sore throat. This has been going on for several days, this is not getting any better or worse and it is not associated with fevers. He had an extensive cardiac workup approximately one year ago he had paroxysmal atrial fibrillation associated with acute onset and exacerbation of diastolic heart failure, he had a coronary catheterization at that time which was nonobstructive, he was started on medications for the atrial fibrillation as well as rate control with a beta blocker   the patient does have chronic renal insufficiency stage III, this is a chronic condition, he also has  hepatitis C which is currently being treated by his family doctor's.  Patient is a 59 y.o. male presenting with headaches. The history is provided by the patient.  Headache   Past Medical History  Diagnosis Date  . Essential hypertension   . Type 2 diabetes mellitus with hypercholesterolemia   . Degenerative disc disease   . Degenerative cervical disc   . PAF (paroxysmal atrial fibrillation)   . Chronic diastolic heart failure     a. in the setting of a-fib --> Nonobstructive cath 09/06/2013 30% LAD.   2-D ECHO: Normal LV size and thickness. Mild LA dilatation. EF ~65%, no regional WMA  . CKD stage 3 due to type 2 diabetes mellitus     Cr ~1.9   Past Surgical History  Procedure Laterality Date  . Bowel resection    . Neck surgery    . Tonsillectomy    . Hernia repair      YEARS AGO  . Cholecystectomy    . Anterior cervical decomp/discectomy fusion  12/17/2011    Procedure: ANTERIOR CERVICAL DECOMPRESSION/DISCECTOMY FUSION 1 LEVEL;  Surgeon: Erline Levine, MD;  Location: [place] NEURO ORS;  Service: Neurosurgery;  Laterality: N/A;  Exploration of Fusion and Anterior Cervical Six-Seven Decompression/Diskectomy/Fusion  . Posterior cervical fusion/foraminotomy  12/17/2011    Procedure: POSTERIOR CERVICAL FUSION/FORAMINOTOMY LEVEL 4;  Surgeon: Erline Levine, MD;  Location: [facility] NEURO ORS;  Service: Neurosurgery;;  Posterior Cervical Three-Seven Fusion  . Cardiac catheterization  09/06/2013    30% LAD. Otherwise nonobstructive CAD  . Left heart catheterization with coronary angiogram N/A 09/06/2013    Procedure: LEFT HEART CATHETERIZATION WITH CORONARY ANGIOGRAM;  Surgeon: Leonie Man, MD;  Location: [facility] CATH LAB;  Service: Cardiovascular;  Laterality: N/A;   Family History  Problem Relation Age of Onset  . Heart failure Mother   . Diabetes Mellitus II Mother   . Diabetes Mellitus II Sister    History  Substance Use Topics  . Smoking status: Never Smoker   . Smokeless tobacco: Never Used   . Alcohol Use: No    Review of Systems  Neurological: Positive for headaches.  All other systems reviewed and are negative.     Allergies  Penicillins  Home Medications   Prior to Admission medications   Medication Sig Start Date End Date Taking? Authorizing Provider  apixaban (ELIQUIS) 5 MG TABS tablet Take 1 tablet (5 mg total) by mouth 2 (two) times daily. 10/01/13  Yes Imogene Burn, PA-C  aspirin EC 81 MG EC tablet Take 1 tablet (81 mg total) by mouth daily. 09/07/13  Yes Allie Bossier, MD  carvedilol (COREG) 6.25 MG tablet Take 1 tablet (6.25 mg total) by mouth 2 (two) times daily. 12/04/13  Yes Leonie Man, MD  Elbasvir-Grazoprevir 50-100 MG TABS Take 1 tablet by mouth daily.   Yes Historical Provider, MD  glimepiride (AMARYL) 4 MG tablet Take 4 mg by mouth daily with breakfast.   Yes Historical Provider, MD  ONE TOUCH ULTRA TEST test strip  09/25/13  Yes Historical Provider, MD  oxyCODONE (ROXICODONE) 15 MG immediate release tablet Take 7.5-15 mg by mouth 4 (four) times daily as needed for pain.  07/19/14  Yes Historical Provider, MD  pregabalin (LYRICA) 100 MG capsule Take 100 mg by mouth 2 (two) times daily as needed (for pain.).    Yes Historical Provider, MD  atorvastatin (LIPITOR) 40 MG tablet Take 1 tablet (40 mg total) by mouth daily at 6 PM. Patient not taking: Reported on 06/06/2014 09/07/13   Allie Bossier, MD  benzonatate (TESSALON) 100 MG capsule Take 1 capsule (100 mg total) by mouth every 8 (eight) hours. 07/24/14   Noemi Chapel, MD  furosemide (LASIX) 20 MG tablet Take 1 tablet (20 mg total) by mouth daily. Patient not taking: Reported on 06/06/2014 10/01/13   Imogene Burn, PA-C   BP 154/89 mmHg  Pulse 68  Temp(Src) 98.1 F (36.7 C) (Oral)  Resp 16  SpO2 98% Physical Exam  Constitutional: He appears well-developed and well-nourished. No distress.  HENT:  Head: Normocephalic and atraumatic.  Mouth/Throat: Oropharynx is clear and moist. No  oropharyngeal exudate.  Clear oropharynx, moist mucous membranes  Eyes: Conjunctivae and EOM are normal. Pupils are equal, round, and reactive to light. Right eye exhibits no discharge. Left eye exhibits no discharge. No scleral icterus.  Neck: Normal range of motion. Neck supple. No JVD present. No thyromegaly present.  Decreased range of motion of the neck secondary to pain in the left side of the neck, tenderness to palpation in the lateral neck and paraspinal muscles  Cardiovascular: Normal rate, regular rhythm, normal heart sounds and intact distal pulses.  Exam reveals no gallop and no friction rub.   No murmur heard. Pulmonary/Chest: Effort normal and breath sounds normal. No respiratory distress. He has no wheezes. He has no rales.  Lungs clear to auscultation bilaterally  Abdominal: Soft. Bowel sounds are normal. He exhibits no distension and no  mass. There is no tenderness.  Musculoskeletal: Normal range of motion. He exhibits no edema or tenderness.  No edema, no asymmetry, normal strength in all 4 extremities, able to straight leg raise bilaterally against resistance, normal grips  Lymphadenopathy:    He has no cervical adenopathy.  Neurological: He is alert. Coordination normal.  Normal sensation to light touch in all 4 extremities, cranial nerves III through XII are normal, coordination is normal  Skin: Skin is warm and dry. No rash noted. No erythema.  Psychiatric: He has a normal mood and affect. His behavior is normal.  Nursing note and vitals reviewed.   ED Course  Procedures (including critical care time) Labs Review Labs Reviewed  CBC - Abnormal; Notable for the following:    MCV 75.0 (*)    Platelets 125 (*)    All other components within normal limits  BASIC METABOLIC PANEL - Abnormal; Notable for the following:    Glucose, Bld 119 (*)    BUN 27 (*)    Creatinine, Ser 1.88 (*)    Calcium 8.8 (*)    GFR calc non Af Amer 38 (*)    GFR calc Af Amer 44 (*)    All  other components within normal limits  CBC  I-STAT TROPOININ, ED    Imaging Review Dg Chest 2 View  07/24/2014   CLINICAL DATA:  Cough, congestion, and shortness of breath for 3 days. Headache.  EXAM: CHEST  2 VIEW  COMPARISON:  10/07/2013  FINDINGS: Normal heart size and pulmonary vascularity. No focal airspace disease or consolidation in the lungs. No blunting of costophrenic angles. No pneumothorax. Mediastinal contours appear intact. Postoperative changes in the cervical spine.  IMPRESSION: No active cardiopulmonary disease.   Electronically Signed   By: Lucienne Capers M.D.   On: 07/24/2014 06:36     EKG Interpretation   Date/Time:  Wednesday July 24 2014 06:23:18 EDT Ventricular Rate:  76 PR Interval:  167 QRS Duration: 87 QT Interval:  400 QTC Calculation: 450 R Axis:   2 Text Interpretation:  Sinus rhythm Repol abnrm suggests ischemia,  anterolateral Confirmed by [facility]  MD, APRIL ([id number]) on 07/24/2014  6:34:16 AM      MDM   Final diagnoses:  Nonintractable headache, unspecified chronicity pattern, unspecified headache type  Left cervical radiculopathy  Cough    The patient has vital signs showing hypertension, he states that he has been perfect with taking his medications, he will need something stronger for pain today though of note the patient has had oxycodone in the past, his headaches are not any new compared to prior headaches, this was just lasting longer. He does not have any new focal neurologic deficits, he does not have a fever, he does not have any new neurologic symptoms and his left arm symptoms have been going on for over 5 months. Prior to my arrival chest x-ray and blood work were ordered due to her grossly abnormal EKG and his persistent cough, chest x-rays without any acute findings, lab work shows no abnormalities except for his known renal insufficiency which does not appear to be far from baseline. We'll proceed with pain control. Cough  medication  8:05 AM - pt reevaluated - states that he feels better after medicines - now states that the headache is really associated with the coughing and he has been doing more coughing recently - hence more headache.  9:57 AM - no headache, continues to complain of cough, labs and xray without findings -  VS improved.  Amenable to d/c and agreeable to plan- refuses steroids - refer back to Dr. Vertell Limber who did his cervical decompression and fusion in 2013.  Filed Vitals:   07/24/14 0712 07/24/14 0732 07/24/14 0800 07/24/14 0900  BP: 174/104 163/93 153/95 154/89  Pulse: 79 76 68 68  Temp:      TempSrc:      Resp: 17 17 15 16   SpO2: 99% 99% 99% 98%   Prior to discharge the patient continued to complain of chest pain and left arm pain, again this has been going on for over 1 week in the arm and the chest, it is associated with coughing, he has a negative troponin, his EKG is abnormal but completely unchanged from prior EKGs and he had a normal cardiac cath one year ago. It is unlikely that his chest pain is related to an acute coronary syndrome.   Meds given in ED:  Medications  ketorolac (TORADOL) 30 MG/ML injection 30 mg (30 mg Intravenous Given 07/24/14 0732)  metoCLOPramide (REGLAN) injection 10 mg (10 mg Intravenous Given 07/24/14 0729)  sodium chloride 0.9 % bolus 1,000 mL (1,000 mLs Intravenous New Bag/Given 07/24/14 0732)  diphenhydrAMINE (BENADRYL) injection 25 mg (25 mg Intravenous Given 07/24/14 0729)  benzonatate (TESSALON) capsule 100 mg (100 mg Oral Given 07/24/14 0733)    New Prescriptions   BENZONATATE (TESSALON) 100 MG CAPSULE    Take 1 capsule (100 mg total) by mouth every 8 (eight) hours.      Noemi Chapel, MD 07/24/14 [id number]  Noemi Chapel, MD 07/24/14 [phone]

## 2014-09-23 ENCOUNTER — Telehealth: Payer: Self-pay | Admitting: Cardiology

## 2014-09-23 NOTE — Telephone Encounter (Signed)
Called back, spoke w/ Hailey.  Dr. Vertell Limber was wanting to know why pt was on Eliquis.  Shared that pt has history of A Fib.  Dr. Vertell Limber may want to do injection. Hailey will call back if so, may request clearance to hold Eliquis.

## 2014-09-23 NOTE — Telephone Encounter (Signed)
Barry Lopez called in stating that Dr. 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 She did not elaborate as to what the message was about. Please f/u with her   Thanks

## 2015-01-03 ENCOUNTER — Encounter (HOSPITAL_COMMUNITY): Payer: Self-pay | Admitting: Emergency Medicine

## 2015-01-03 ENCOUNTER — Emergency Department (HOSPITAL_COMMUNITY)
Admission: EM | Admit: 2015-01-03 | Discharge: 2015-01-03 | Disposition: A | Discharge status: Home / Self Care | Payer: BLUE CROSS/BLUE SHIELD | Attending: Emergency Medicine | Admitting: Emergency Medicine

## 2015-01-03 ENCOUNTER — Emergency Department (EMERGENCY_DEPARTMENT_HOSPITAL): Payer: BLUE CROSS/BLUE SHIELD

## 2015-01-03 DIAGNOSIS — Z9889 Other specified postprocedural states: Secondary | ICD-10-CM | POA: Insufficient documentation

## 2015-01-03 DIAGNOSIS — E1122 Type 2 diabetes mellitus with diabetic chronic kidney disease: Secondary | ICD-10-CM | POA: Diagnosis not present

## 2015-01-03 DIAGNOSIS — Z7902 Long term (current) use of antithrombotics/antiplatelets: Secondary | ICD-10-CM | POA: Insufficient documentation

## 2015-01-03 DIAGNOSIS — Z880 Allergy status to penicillin: Secondary | ICD-10-CM | POA: Insufficient documentation

## 2015-01-03 DIAGNOSIS — M7989 Other specified soft tissue disorders: Secondary | ICD-10-CM | POA: Diagnosis not present

## 2015-01-03 DIAGNOSIS — Z7982 Long term (current) use of aspirin: Secondary | ICD-10-CM | POA: Insufficient documentation

## 2015-01-03 DIAGNOSIS — E7800 Pure hypercholesterolemia, unspecified: Secondary | ICD-10-CM | POA: Diagnosis not present

## 2015-01-03 DIAGNOSIS — I5032 Chronic diastolic (congestive) heart failure: Secondary | ICD-10-CM | POA: Insufficient documentation

## 2015-01-03 DIAGNOSIS — Z79899 Other long term (current) drug therapy: Secondary | ICD-10-CM | POA: Diagnosis not present

## 2015-01-03 DIAGNOSIS — I480 Paroxysmal atrial fibrillation: Secondary | ICD-10-CM | POA: Diagnosis not present

## 2015-01-03 DIAGNOSIS — M79609 Pain in unspecified limb: Secondary | ICD-10-CM

## 2015-01-03 DIAGNOSIS — N183 Chronic kidney disease, stage 3 (moderate): Secondary | ICD-10-CM | POA: Diagnosis not present

## 2015-01-03 DIAGNOSIS — I129 Hypertensive chronic kidney disease with stage 1 through stage 4 chronic kidney disease, or unspecified chronic kidney disease: Secondary | ICD-10-CM | POA: Insufficient documentation

## 2015-01-03 DIAGNOSIS — M79661 Pain in right lower leg: Secondary | ICD-10-CM

## 2015-01-03 MED ORDER — ACETAMINOPHEN 325 MG PO TABS
650.0000 mg | ORAL_TABLET | Freq: Once | ORAL | Status: AC
  Administered 2015-01-03: 650 mg via ORAL
  Filled 2015-01-03: qty 2

## 2015-01-03 NOTE — ED Notes (Signed)
Pt come to ED with c/o of right calf pain, was at work 2 weeks ago, when he felt a sharp pain in the back of his leg. Pt went  to his primary provider dr. Jeanie Cooks at [facility]. Was prescribed some pain medicines but has not been taking them because of a medical history of kidney problems he has verbalized. Provider in the room. Assessment started.

## 2015-01-03 NOTE — ED Provider Notes (Signed)
CSN: [id number]     Arrival date & time 01/03/15  0827 History   First MD Initiated Contact with Patient 01/03/15 [phone]     Chief Complaint  Patient presents with  . Leg Pain     (Consider location/radiation/quality/duration/timing/severity/associated sxs/prior Treatment) HPI Patient presents with concern of right calf pain.  Symptoms began about 2 weeks ago, when the patient felt sharp onset of pain in the posterior right calf, popliteal area, while closing a car door. Patient has been seen by primary care, has been provided ibuprofen, but is not taking medication secondary to renal disease. Patient has been persistent, sharp, worsening today. There is associated swelling, but no distal loss of sensation or strength. No current other complaints, including no chest pain, dyspnea.  Past Medical History  Diagnosis Date  . Essential hypertension   . Type 2 diabetes mellitus with hypercholesterolemia ([place])   . Degenerative disc disease   . Degenerative cervical disc   . PAF (paroxysmal atrial fibrillation) ([place])   . Chronic diastolic heart failure (HCC)     a. in the setting of a-fib --> Nonobstructive cath 09/06/2013 30% LAD.   2-D ECHO: Normal LV size and thickness. Mild LA dilatation. EF ~65%, no regional WMA  . CKD stage 3 due to type 2 diabetes mellitus (HCC)     Cr ~1.9   Past Surgical History  Procedure Laterality Date  . Bowel resection    . Neck surgery    . Tonsillectomy    . Hernia repair      YEARS AGO  . Cholecystectomy    . Anterior cervical decomp/discectomy fusion  12/17/2011    Procedure: ANTERIOR CERVICAL DECOMPRESSION/DISCECTOMY FUSION 1 LEVEL;  Surgeon: Erline Levine, MD;  Location: [facility];  Service: Neurosurgery;  Laterality: N/A;  Exploration of Fusion and Anterior Cervical Six-Seven Decompression/Diskectomy/Fusion  . Posterior cervical fusion/foraminotomy  12/17/2011    Procedure: POSTERIOR CERVICAL FUSION/FORAMINOTOMY LEVEL 4;  Surgeon: Erline Levine, MD;   Location: [facility];  Service: Neurosurgery;;  Posterior Cervical Three-Seven Fusion  . Cardiac catheterization  09/06/2013    30% LAD. Otherwise nonobstructive CAD  . Left heart catheterization with coronary angiogram N/A 09/06/2013    Procedure: LEFT HEART CATHETERIZATION WITH CORONARY ANGIOGRAM;  Surgeon: Leonie Man, MD;  Location: [facility] C[facility];  Service: Cardiovascular;  Laterality: N/A;   Family History  Problem Relation Age of Onset  . Heart failure Mother   . Diabetes Mellitus II Mother   . Diabetes Mellitus II Sister    Social History  Substance Use Topics  . Smoking status: Never Smoker   . Smokeless tobacco: Never Used  . Alcohol Use: No    Review of Systems  Constitutional:       Per HPI, otherwise negative  HENT:       Per HPI, otherwise negative  Respiratory:       Per HPI, otherwise negative  Cardiovascular:       Per HPI, otherwise negative  Gastrointestinal: Negative for vomiting.  Endocrine:       Negative aside from HPI  Genitourinary:       Neg aside from HPI   Musculoskeletal:       Per HPI, otherwise negative  Skin: Negative.   Neurological: Negative for syncope.      Allergies  Penicillins  Home Medications   Prior to Admission medications   Medication Sig Start Date End Date Taking? Authorizing Provider  apixaban (ELIQUIS) 5 MG TABS tablet Take 1 tablet (  5 mg total) by mouth 2 (two) times daily. 10/01/13   Imogene Burn, PA-C  aspirin EC 81 MG EC tablet Take 1 tablet (81 mg total) by mouth daily. 09/07/13   Allie Bossier, MD  atorvastatin (LIPITOR) 40 MG tablet Take 1 tablet (40 mg total) by mouth daily at 6 PM. Patient not taking: Reported on 06/06/2014 09/07/13   Allie Bossier, MD  benzonatate (TESSALON) 100 MG capsule Take 1 capsule (100 mg total) by mouth every 8 (eight) hours. 07/24/14   Noemi Chapel, MD  carvedilol (COREG) 6.25 MG tablet Take 1 tablet (6.25 mg total) by mouth 2 (two) times daily. 12/04/13   Leonie Man, MD    Elbasvir-Grazoprevir 50-100 MG TABS Take 1 tablet by mouth daily.    Historical Provider, MD  furosemide (LASIX) 20 MG tablet Take 1 tablet (20 mg total) by mouth daily. Patient not taking: Reported on 06/06/2014 10/01/13   Imogene Burn, PA-C  glimepiride (AMARYL) 4 MG tablet Take 4 mg by mouth daily with breakfast.    Historical Provider, MD  ONE TOUCH ULTRA TEST test strip  09/25/13   Historical Provider, MD  oxyCODONE (ROXICODONE) 15 MG immediate release tablet Take 7.5-15 mg by mouth 4 (four) times daily as needed for pain.  07/19/14   Historical Provider, MD  pregabalin (LYRICA) 100 MG capsule Take 100 mg by mouth 2 (two) times daily as needed (for pain.).     Historical Provider, MD   BP 152/91 mmHg  Pulse 73  Temp(Src) 98.1 F (36.7 C) (Oral)  Resp 16  Ht 5\' 7"  (1.702 m)  Wt 185 lb (83.915 kg)  BMI 28.97 kg/m2  SpO2 96% Physical Exam  Constitutional: He is oriented to person, place, and time. He appears well-developed. No distress.  HENT:  Head: Normocephalic and atraumatic.  Eyes: Conjunctivae and EOM are normal.  Cardiovascular: Normal rate and regular rhythm.   Pulmonary/Chest: Effort normal. No stridor. No respiratory distress.  Abdominal: He exhibits no distension.  Musculoskeletal: He exhibits edema.       Legs: Neurological: He is alert and oriented to person, place, and time.  Skin: Skin is warm and dry.  Psychiatric: He has a normal mood and affect.  Nursing note and vitals reviewed.   ED Course  Procedures (including critical care time) Pulse oxygen 99% room air normal After the initial evaluation patient received analgesia, ice, ultrasound ordered.   11:21 AM On repeat exam the patient is in no distress. I I had previously discussed the patient's ultrasound results with the technician, who confirmed no DVT, no Baker cyst. I relayed this information to the patient, and we discussed the likelihood of gastrocnemius muscle disruption. Patient has some  capacity to flex and extend the ankle, and there is low suspicion for complete avulsion. Patient will have Ace wrap applied, has crutches at home, will follow up with orthopedics. MDM  Presents with ongoing pain in the posterior right calf, medial calf. Here, no evidence for DVT, Baker's cyst. There suspicion for gastric anemias muscle lesions. Patient was counseled on the need for minimal weightbearing, partial mobilization, orthopedic follow-up, was discharged to follow-up as an outpatient.  Carmin Muskrat, MD 01/03/15 [phone]

## 2015-01-03 NOTE — Progress Notes (Signed)
VASCULAR LAB PRELIMINARY  PRELIMINARY  PRELIMINARY  PRELIMINARY  Right lower extremity venous duplex completed.     Preliminary report:  Right:  No evidence of DVT, superficial thrombosis, or Baker's cyst.  Barry Lopez, RVS 01/03/2015, 10:36 AM

## 2015-01-03 NOTE — Discharge Instructions (Signed)
As discussed, your evaluation today has been largely reassuring.  But, it is important that you monitor your condition carefully, and do not hesitate to return to the ED if you develop new, or concerning changes in your condition.  Your pain is likely due to irritation of the gastrocnemius, or calf muscle.  It is important that you use your crutches, use ice packs, and take Tylenol for pain control.  Equally important is that you follow-up with our orthopedic colleagues.   Cryotherapy Cryotherapy means treatment with cold. Ice or gel packs can be used to reduce both pain and swelling. Ice is the most helpful within the first 24 to 48 hours after an injury or flare-up from overusing a muscle or joint. Sprains, strains, spasms, burning pain, shooting pain, and aches can all be eased with ice. Ice can also be used when recovering from surgery. Ice is effective, has very few side effects, and is safe for most people to use. PRECAUTIONS  Ice is not a safe treatment option for people with:  Raynaud phenomenon. This is a condition affecting small blood vessels in the extremities. Exposure to cold may cause your problems to return.  Cold hypersensitivity. There are many forms of cold hypersensitivity, including:  Cold urticaria. Red, itchy hives appear on the skin when the tissues begin to warm after being iced.  Cold erythema. This is a red, itchy rash caused by exposure to cold.  Cold hemoglobinuria. Red blood cells break down when the tissues begin to warm after being iced. The hemoglobin that carry oxygen are passed into the urine because they cannot combine with blood proteins fast enough.  Numbness or altered sensitivity in the area being iced. If you have any of the following conditions, do not use ice until you have discussed cryotherapy with your caregiver:  Heart conditions, such as arrhythmia, angina, or chronic heart disease.  High blood pressure.  Healing wounds or open skin in the  area being iced.  Current infections.  Rheumatoid arthritis.  Poor circulation.  Diabetes. Ice slows the blood flow in the region it is applied. This is beneficial when trying to stop inflamed tissues from spreading irritating chemicals to surrounding tissues. However, if you expose your skin to cold temperatures for too long or without the proper protection, you can damage your skin or nerves. Watch for signs of skin damage due to cold. HOME CARE INSTRUCTIONS Follow these tips to use ice and cold packs safely.  Place a dry or damp towel between the ice and skin. A damp towel will cool the skin more quickly, so you may need to shorten the time that the ice is used.  For a more rapid response, add gentle compression to the ice.  Ice for no more than 10 to 20 minutes at a time. The bonier the area you are icing, the less time it will take to get the benefits of ice.  Check your skin after 5 minutes to make sure there are no signs of a poor response to cold or skin damage.  Rest 20 minutes or more between uses.  Once your skin is numb, you can end your treatment. You can test numbness by very lightly touching your skin. The touch should be so light that you do not see the skin dimple from the pressure of your fingertip. When using ice, most people will feel these normal sensations in this order: cold, burning, aching, and numbness.  Do not use ice on someone who cannot communicate  their responses to pain, such as small children or people with dementia. HOW TO MAKE AN ICE PACK Ice packs are the most common way to use ice therapy. Other methods include ice massage, ice baths, and cryosprays. Muscle creams that cause a cold, tingly feeling do not offer the same benefits that ice offers and should not be used as a substitute unless recommended by your caregiver. To make an ice pack, do one of the following:  Place crushed ice or a bag of frozen vegetables in a sealable plastic bag. Squeeze out  the excess air. Place this bag inside another plastic bag. Slide the bag into a pillowcase or place a damp towel between your skin and the bag.  Mix 3 parts water with 1 part rubbing alcohol. Freeze the mixture in a sealable plastic bag. When you remove the mixture from the freezer, it will be slushy. Squeeze out the excess air. Place this bag inside another plastic bag. Slide the bag into a pillowcase or place a damp towel between your skin and the bag. SEEK MEDICAL CARE IF:  You develop white spots on your skin. This may give the skin a blotchy (mottled) appearance.  Your skin turns blue or pale.  Your skin becomes waxy or hard.  Your swelling gets worse. MAKE SURE YOU:   Understand these instructions.  Will watch your condition.  Will get help right away if you are not doing well or get worse.   This information is not intended to replace advice given to you by your health care provider. Make sure you discuss any questions you have with your health care provider.   Document Released: 09/21/2010 Document Revised: 02/15/2014 Document Reviewed: 09/21/2010 Elsevier Interactive Patient Education [employer].

## 2015-01-05 ENCOUNTER — Other Ambulatory Visit: Payer: Self-pay | Admitting: Cardiology

## 2015-05-12 DIAGNOSIS — S86811D Strain of other muscle(s) and tendon(s) at lower leg level, right leg, subsequent encounter: Secondary | ICD-10-CM | POA: Diagnosis not present

## 2015-05-13 DIAGNOSIS — M25562 Pain in left knee: Secondary | ICD-10-CM | POA: Diagnosis not present

## 2015-05-19 DIAGNOSIS — S83242A Other tear of medial meniscus, current injury, left knee, initial encounter: Secondary | ICD-10-CM | POA: Diagnosis not present

## 2015-05-20 ENCOUNTER — Telehealth: Payer: Self-pay | Admitting: *Deleted

## 2015-05-20 NOTE — Telephone Encounter (Signed)
SPOKE TO PATIENT'S WIFE INFORMED WIFE THAT PATIENT HAS NOT HAD AN APPOINTMENT SINCE 2016.  WILL NEED AN OFFICE VISIT FOR CARDIAC CLEARANCE FOR LEFT KNEE SCOPE--WITH DR Marchia Bond.   APPOINTMENT SCHEDULE FOR 05/22/15 AT 2 PM, IF PATIENT IS UNABLE MAKE APPOINTMENT - HE WILL CALL TOMORROW TO RESCHEDULE SHE VERBALIZED UNDERSTANDING.

## 2015-05-22 ENCOUNTER — Encounter: Payer: Self-pay | Admitting: Cardiology

## 2015-05-22 ENCOUNTER — Ambulatory Visit (INDEPENDENT_AMBULATORY_CARE_PROVIDER_SITE_OTHER): Payer: BLUE CROSS/BLUE SHIELD | Admitting: Cardiology

## 2015-05-22 VITALS — BP 164/98 | HR 70 | Ht 68.0 in | Wt 191.6 lb

## 2015-05-22 DIAGNOSIS — E785 Hyperlipidemia, unspecified: Secondary | ICD-10-CM | POA: Diagnosis not present

## 2015-05-22 DIAGNOSIS — Z01810 Encounter for preprocedural cardiovascular examination: Secondary | ICD-10-CM | POA: Diagnosis not present

## 2015-05-22 DIAGNOSIS — I480 Paroxysmal atrial fibrillation: Secondary | ICD-10-CM

## 2015-05-22 DIAGNOSIS — R9431 Abnormal electrocardiogram [ECG] [EKG]: Secondary | ICD-10-CM

## 2015-05-22 DIAGNOSIS — I10 Essential (primary) hypertension: Secondary | ICD-10-CM | POA: Diagnosis not present

## 2015-05-22 DIAGNOSIS — I5032 Chronic diastolic (congestive) heart failure: Secondary | ICD-10-CM | POA: Diagnosis not present

## 2015-05-22 NOTE — Progress Notes (Signed)
PCP: Barry Fendt, MD  Clinic Note: Chief Complaint  Patient presents with  . Annual Exam     LEG PAIN OR CRAMPING; left kneee EDEMA; left knee  . Pre-op Exam    For left knee surgery    HPI: Barry Lopez is a 60 y.o. male with a PMH below who presents today for Annual f/u for HTN, PAF & DHF - here for pre-operative Risk Assessment for Arthoscopic L Knee Sgx  Barry Lopez was last seen in April 2016.  Before that, he was seen in Oct 2015 - was after a DHF / Afib admission in July 2015 -- Echo - ~ normal & Cath with mild LAD disease.  Bradycardic on BB - stopped.  CHADS2VASC2- 3.   Recent Hospitalizations: several ER visits in Fall 2016.  Studies Reviewed: n/a  Interval History: [name] presents today in the local moderate pain from his left knee. He does note that he has limited as to the amount of activity is able to do because of his knee pain. He alsothat he may put on Lomotil weight over the last few weeks because he is not to exercise. Other than that prior to injuring his knee, he was doing great without any problems. He has not had any recurrent rapid or irregular heartbeat/palpitations since he was in the hospital couple years ago. He has never had any anginal symptoms, and has not had heart failure symptoms in the absence of A. fib.  He does have mild edema if he doesn't take his low-dose Lasix, but otherwise is doing fine. No associated PND or orthopnea  The Remainder of His Cardiovascular Review of Symptoms are as follows:  No chest pain or shortness of breath with rest or exertion.  No palpitations, lightheadedness, dizziness, weakness, syncope/near syncope, or TIA/amaurosis fugax symptoms. No melena, hematochezia, hematuria, or epstaxis. No claudication.  ROS: A comprehensive was performed. Review of Systems  Constitutional: Negative for fever, malaise/fatigue and diaphoresis.  HENT: Negative for congestion and nosebleeds.   Eyes: Negative for blurred vision  and double vision.  Respiratory: Negative for cough, shortness of breath and wheezing.   Cardiovascular:       Per history of present illness  Musculoskeletal: Positive for joint pain (Right knee pain and swelling from meniscal tear).  Neurological: Negative for dizziness, sensory change, speech change, focal weakness, seizures, loss of consciousness and headaches.  Endo/Heme/Allergies: Bruises/bleeds easily (He does bruise, but no significant bleeding).  Psychiatric/Behavioral: Negative for depression and memory loss. The patient is not nervous/anxious (Just a bit anxious about going for surgery) and does not have insomnia.   All other systems reviewed and are negative.   Past Medical History  Diagnosis Date  . Essential hypertension   . Type 2 diabetes mellitus with hypercholesterolemia ([place])   . Degenerative disc disease   . Degenerative cervical disc   . PAF (paroxysmal atrial fibrillation) ([place])   . Chronic diastolic heart failure (HCC)     a. in the setting of a-fib --> Nonobstructive cath 09/06/2013 30% LAD.   2-D ECHO: Normal LV size and thickness. Mild LA dilatation. EF ~65%, no regional WMA  . CKD stage 3 due to type 2 diabetes mellitus (HCC)     Cr ~1.9    Past Surgical History  Procedure Laterality Date  . Bowel resection    . Neck surgery    . Tonsillectomy    . Hernia repair      YEARS AGO  . Cholecystectomy    .  Anterior cervical decomp/discectomy fusion  12/17/2011    Procedure: ANTERIOR CERVICAL DECOMPRESSION/DISCECTOMY FUSION 1 LEVEL;  Surgeon: Erline Levine, MD;  Location: [place] [facility];  Service: Neurosurgery;  Laterality: N/A;  Exploration of Fusion and Anterior Cervical Six-Seven Decompression/Diskectomy/Fusion  . Posterior cervical fusion/foraminotomy  12/17/2011    Procedure: POSTERIOR CERVICAL FUSION/FORAMINOTOMY LEVEL 4;  Surgeon: Erline Levine, MD;  Location: [facility];  Service: Neurosurgery;;  Posterior Cervical Three-Seven Fusion  . Cardiac  catheterization  09/06/2013    30% LAD. Otherwise nonobstructive CAD  . Left heart catheterization with coronary angiogram N/A 09/06/2013    Procedure: LEFT HEART CATHETERIZATION WITH CORONARY ANGIOGRAM;  Surgeon: Leonie Man, MD;  Location: [facility] CATH LAB;  Service: Cardiovascular;  Laterality: N/A;   Prior to Admission medications   Medication Sig Start Date End Date Taking? Authorizing Provider  AFLURIA PRESERVATIVE FREE 0.5 ML SUSY Inject 1 Dose as directed once. 10/07/14  Yes Historical Provider, MD  apixaban (ELIQUIS) 5 MG TABS tablet Take 1 tablet (5 mg total) by mouth 2 (two) times daily. 10/01/13  Yes Imogene Burn, PA-C  aspirin EC 81 MG EC tablet Take 1 tablet (81 mg total) by mouth daily. 09/07/13  Yes Allie Bossier, MD  carvedilol (COREG) 6.25 MG tablet TAKE ONE TABLET BY MOUTH TWICE DAILY 01/06/15  Yes Leonie Man, MD  glimepiride (AMARYL) 4 MG tablet Take 4 mg by mouth 2 (two) times daily.   Yes Historical Provider, MD  lisinopril (PRINIVIL,ZESTRIL) 40 MG tablet Take 40 mg by mouth daily. 11/28/14  Yes Historical Provider, MD  ONE TOUCH ULTRA TEST test strip  09/25/13  Yes Historical Provider, MD  oxyCODONE (ROXICODONE) 15 MG immediate release tablet Take 7.5-15 mg by mouth 4 (four) times daily as needed for pain.  07/19/14  Yes Historical Provider, MD  pregabalin (LYRICA) 100 MG capsule Take 100 mg by mouth 2 (two) times daily as needed (for pain.).    Yes Historical Provider, MD  Vitamin D, Ergocalciferol, (DRISDOL) 50000 UNITS CAPS capsule Take 50,000 Units by mouth once a week. 04/25/12  Yes Historical Provider, MD     Allergies  Allergen Reactions  . Penicillins Hives and Itching    Has patient had a PCN reaction causing immediate rash, facial/tongue/throat swelling, SOB or lightheadedness with hypotension:  Has patient had a PCN reaction causing severe rash involving mucus membranes or skin necrosis:  Has patient had a PCN reaction that required hospitalization  Has  patient had a PCN reaction occurring within the last 10 years: No If all of the above answers are "NO", then may proceed with Cephalosporin use.     Social History   Social History  . Marital Status: Married    Spouse Name: N/A  . Number of Children: N/A  . Years of Education: N/A   Social History Main Topics  . Smoking status: Never Smoker   . Smokeless tobacco: Never Used  . Alcohol Use: No  . Drug Use: No  . Sexual Activity: No   Other Topics Concern  . None   Social History Narrative   family history includes Diabetes Mellitus II in his mother and sister; Heart failure in his mother.   Wt Readings from Last 3 Encounters:  05/22/15 191 lb 9.6 oz (86.909 kg)  01/03/15 185 lb (83.915 kg)  06/06/14 189 lb 11.2 oz (86.047 kg)    PHYSICAL EXAM BP 164/98 mmHg  Pulse 70  Ht 5\' 8"  (1.727 m)  Wt 191 lb 9.6  oz (86.909 kg)  BMI 29.14 kg/m2; blood pressure on my recheck was 144/ 90 mmHg General appearance: alert, cooperative, appears stated age, no distress and borderline obese HEENT: [place]/AT, EOMI, MMM, anicteric sclera Neck: no adenopathy, no carotid bruit and no JVD Lungs: CTA B., normal percussion bilaterally and non-labored Heart: RRR, S1& S2 normal, no murmur, click, rub or gallop; nondisplaced PMI  Abdomen: soft, NT/ND/NABS, No HSM; No masses, no organomegaly;  Extremities: extremities normal, atraumatic, no cyanosis, or edema Pulses: 2+ and symmetric Skin: normal Neurologic: Mental status: Alert, oriented, thought content appropriate Cranial nerves: normal (II-XII grossly intact)   Adult ECG Report  Rate: 70 ;  Rhythm: normal sinus rhythm and LVH with repolarization abnormalities (persistent T-wave inversion in anterolateral leads);   Narrative Interpretation: Relatively stable EKG   Other studies Reviewed: Additional studies/ records that were reviewed today include:  Recent Labs:  Monitored by PCP. As available  ASSESSMENT / PLAN: Problem List Items  Addressed This Visit    Pre-operative cardiovascular examination - Primary    Mr. Fason really has no major cardiac issues besides PAF and mild diastolic heart failure while in A. fib with RVR. He has not required significant heart failure. He has diabetes, but is not on insulin. PREOPERATIVE CARDIAC RISK ASSESSMENT   Revised Cardiac Risk Index:  High Risk Surgery: no; Knee Surgery = LOW Risk  Defined as Intraperitoneal, intrathoracic or suprainguinal vascular  Active CAD: no;    CHF: no; Not active, normal EF  Cerebrovascular Disease: no;    Diabetes: yes; On Insulin: no  CKD (Cr >~ 2): no;   Total: 0 Estimated Risk of Adverse Outcome: LOW RISK  Estimated Risk of MI, PE, VF/VT (Cardiac Arrest), Complete Heart Block: <1 %; Reduced 2/2 chronic standing BB dose   ACC/AHA Guidelines for "Clearance":  Step 1 - Need for Emergency Surgery: No:   If Yes - go straight to OR with perioperative surveillance  Step 2 - Active Cardiac Conditions (Unstable Angina, Decompensated HF, Significant  Arrhytmias - Complete HB, Mobitz II, Symptomatic VT or SVT, Severe Aortic Stenosis - mean gradient > 40 mmHg, Valve area < 1.0 cm2):   No: Has not had an Afib recurrence in > 1 yr  If Yes - Evaluate & Treat per ACC/AHA Guidelines  Step 3 -  Low Risk Surgery: Yes  If Yes --> proceed to OR  If No --> Step 4  Step 4 - Functional Capacity >= 4 METS without symptoms: Yes  If Yes --> proceed to OR  If No --> Step 5  Based on the ACC/AHA guidelines and Revised Cardiac Risk Index, he will be a low risk patient for low risk surgery and no further cardiac evaluation is needed. He does have an abnormal EKG which is stable and chronic.      Relevant Orders   EKG 12-Lead (Completed)   PAF (paroxysmal atrial fibrillation) (HCC) (Chronic)    No recurrence of A. fib since the initial event. Remains on carvedilol for rate control.  This patients CHA2DS2-VASc Score and unadjusted Ischemic Stroke  Rate (% per year) is equal to 2.2 % stroke rate/year from a score of 2  Above score calculated as 1 point each if present [CHF, HTN, DM, Vascular=MI/PAD/Aortic Plaque, Age if 65-74, or Male] Above score calculated as 2 points each if present [Age > 75, or Stroke/TIA/TE]  We discussed his CHADS2VASC2 score & Has Bled Scores with risk of CVA involved.  He has opted to continue with Eliquis --  but may change his mind next year if he continues to go without recurrence. Can D/c ASA. Can hold Eliquis x 48 hr for surgery, back injection & 24 hr for lower risk procedures such as dental cleaning & joint injections.       Relevant Orders   EKG 12-Lead (Completed)   Hyperlipidemia with target LDL less than 100 (Chronic)    He is on a statin and monitored by PCP.      Relevant Orders   EKG 12-Lead (Completed)   Essential hypertension (Chronic)    Blood pressure is a bit high today. He is having quite a bit of pain from his knee. If necessary preoperatively, we would probably want to increase his carvedilol to 12.5 mg twice a day.      Relevant Orders   EKG 12-Lead (Completed)   Abnormal finding on EKG (Chronic)    He clearly has an abnormal EKG with LVH and repolarization changes these are noted as T-wave inversions anterolaterally. These are long-standing abnormalities that or not acute. They're not indicative of ischemia based on ischemic evaluation and lack of symptoms. This should not warrant any additional evaluation as it is already been evaluated in the past.        Close to 45 minutes was spent with the patient. Greater than 50% of this was in direct consultation and discussing his atrial fibrillation and CHADS2VASC2 & HasBled scores with decision about anticoagulation along with discussing his pre-operative risk assessment.  Current medicines are reviewed at length with the patient today. (+/- concerns) he asked about how long he is stay on Eliquis. The following changes have been  made:  Okay to stop aspirin.  MAY HOLD ELIQUIS 2 DAYS PRIOR TO LEFT KNEE SURGERY AND RESTART AFTER PER SURGEON  CLEARANCE- LOW RISK - NO MORE CARDIAC WORK-UP NEEDED.  Your physician wants you to follow-up in [place].  Studies Ordered:   Orders Placed This Encounter  Procedures  . EKG 12-Lead      Leonie Man, M.D., M.S. Interventional Cardiologist   Pager # [phone] Phone # [phone] [address] [place], [place] [address]

## 2015-05-22 NOTE — Patient Instructions (Signed)
OKAY TO STOP ASPIRIN NOW  MAY HOLD ELIQUIS 2 DAYS PRIOR TO LEFT KNEE SURGERY AND RESTART AFTER PER SURGEON  CLEARANCE- LOW RISK - NO MORE CARDIAC WORK-UP NEEDED.  Your physician wants you to follow-up in [place].  You will receive a reminder letter in the mail two months in advance. If you don't receive a letter, please call our office to schedule the follow-up appointment.  .If you need a refill on your cardiac medications before your next appointment, please call your pharmacy.

## 2015-05-25 ENCOUNTER — Encounter: Payer: Self-pay | Admitting: Cardiology

## 2015-05-25 DIAGNOSIS — Z01810 Encounter for preprocedural cardiovascular examination: Secondary | ICD-10-CM | POA: Insufficient documentation

## 2015-05-25 DIAGNOSIS — R9431 Abnormal electrocardiogram [ECG] [EKG]: Secondary | ICD-10-CM | POA: Insufficient documentation

## 2015-05-25 NOTE — Assessment & Plan Note (Signed)
He clearly has an abnormal EKG with LVH and repolarization changes these are noted as T-wave inversions anterolaterally. These are long-standing abnormalities that or not acute. They're not indicative of ischemia based on ischemic evaluation and lack of symptoms. This should not warrant any additional evaluation as it is already been evaluated in the past.

## 2015-05-25 NOTE — Assessment & Plan Note (Signed)
Mr. Barry Lopez really has no major cardiac issues besides PAF and mild diastolic heart failure while in A. fib with RVR. He has not required significant heart failure. He has diabetes, but is not on insulin. PREOPERATIVE CARDIAC RISK ASSESSMENT   Revised Cardiac Risk Index:  High Risk Surgery: no; Knee Surgery = LOW Risk  Defined as Intraperitoneal, intrathoracic or suprainguinal vascular  Active CAD: no;    CHF: no; Not active, normal EF  Cerebrovascular Disease: no;    Diabetes: yes; On Insulin: no  CKD (Cr >~ 2): no;   Total: 0 Estimated Risk of Adverse Outcome: LOW RISK  Estimated Risk of MI, PE, VF/VT (Cardiac Arrest), Complete Heart Block: <1 %; Reduced 2/2 chronic standing BB dose   ACC/AHA Guidelines for "Clearance":  Step 1 - Need for Emergency Surgery: No:   If Yes - go straight to OR with perioperative surveillance  Step 2 - Active Cardiac Conditions (Unstable Angina, Decompensated HF, Significant  Arrhytmias - Complete HB, Mobitz II, Symptomatic VT or SVT, Severe Aortic Stenosis - mean gradient > 40 mmHg, Valve area < 1.0 cm2):   No: Has not had an Afib recurrence in > 1 yr  If Yes - Evaluate & Treat per ACC/AHA Guidelines  Step 3 -  Low Risk Surgery: Yes  If Yes --> proceed to OR  If No --> Step 4  Step 4 - Functional Capacity >= 4 METS without symptoms: Yes  If Yes --> proceed to OR  If No --> Step 5  Based on the ACC/AHA guidelines and Revised Cardiac Risk Index, he will be a low risk patient for low risk surgery and no further cardiac evaluation is needed. He does have an abnormal EKG which is stable and chronic.

## 2015-05-25 NOTE — Assessment & Plan Note (Signed)
He is on a statin and monitored by PCP.

## 2015-05-25 NOTE — Assessment & Plan Note (Signed)
No recurrence of A. fib since the initial event. Remains on carvedilol for rate control.  This patients CHA2DS2-VASc Score and unadjusted Ischemic Stroke Rate (% per year) is equal to 2.2 % stroke rate/year from a score of 2  Above score calculated as 1 point each if present [CHF, HTN, DM, Vascular=MI/PAD/Aortic Plaque, Age if 65-74, or Male] Above score calculated as 2 points each if present [Age > 75, or Stroke/TIA/TE]  We discussed his CHADS2VASC2 score & Has Bled Scores with risk of CVA involved.  He has opted to continue with Eliquis -- but may change his mind next year if he continues to go without recurrence. Can D/c ASA. Can hold Eliquis x 48 hr for surgery, back injection & 24 hr for lower risk procedures such as dental cleaning & joint injections.

## 2015-05-25 NOTE — Assessment & Plan Note (Signed)
No further heart failure episodes while not in A. fib with RVR. He takes a low-dose Lasix to avoid volume overload, but is not actively having any other symptoms and class I.  He does have diabetes and therefore is already on an ACE inhibitor

## 2015-05-25 NOTE — Assessment & Plan Note (Signed)
Blood pressure is a bit high today. He is having quite a bit of pain from his knee. If necessary preoperatively, we would probably want to increase his carvedilol to 12.5 mg twice a day.

## 2015-05-29 DIAGNOSIS — Y999 Unspecified external cause status: Secondary | ICD-10-CM | POA: Diagnosis not present

## 2015-05-29 DIAGNOSIS — S83242A Other tear of medial meniscus, current injury, left knee, initial encounter: Secondary | ICD-10-CM | POA: Diagnosis not present

## 2015-05-29 DIAGNOSIS — X58XXXA Exposure to other specified factors, initial encounter: Secondary | ICD-10-CM | POA: Diagnosis not present

## 2015-05-29 DIAGNOSIS — S83232A Complex tear of medial meniscus, current injury, left knee, initial encounter: Secondary | ICD-10-CM | POA: Diagnosis not present

## 2015-05-29 DIAGNOSIS — Y939 Activity, unspecified: Secondary | ICD-10-CM | POA: Diagnosis not present

## 2015-05-29 DIAGNOSIS — M94262 Chondromalacia, left knee: Secondary | ICD-10-CM | POA: Diagnosis not present

## 2015-05-29 DIAGNOSIS — Y929 Unspecified place or not applicable: Secondary | ICD-10-CM | POA: Diagnosis not present

## 2015-06-13 DIAGNOSIS — S83242D Other tear of medial meniscus, current injury, left knee, subsequent encounter: Secondary | ICD-10-CM | POA: Diagnosis not present

## 2015-06-17 DIAGNOSIS — E113511 Type 2 diabetes mellitus with proliferative diabetic retinopathy with macular edema, right eye: Secondary | ICD-10-CM | POA: Diagnosis not present

## 2015-06-26 DIAGNOSIS — E784 Other hyperlipidemia: Secondary | ICD-10-CM | POA: Diagnosis not present

## 2015-06-26 DIAGNOSIS — E1140 Type 2 diabetes mellitus with diabetic neuropathy, unspecified: Secondary | ICD-10-CM | POA: Diagnosis not present

## 2015-06-26 DIAGNOSIS — I10 Essential (primary) hypertension: Secondary | ICD-10-CM | POA: Diagnosis not present

## 2015-06-26 DIAGNOSIS — M7552 Bursitis of left shoulder: Secondary | ICD-10-CM | POA: Diagnosis not present

## 2015-06-26 DIAGNOSIS — Z125 Encounter for screening for malignant neoplasm of prostate: Secondary | ICD-10-CM | POA: Diagnosis not present

## 2015-06-30 DIAGNOSIS — D631 Anemia in chronic kidney disease: Secondary | ICD-10-CM | POA: Diagnosis not present

## 2015-06-30 DIAGNOSIS — N183 Chronic kidney disease, stage 3 (moderate): Secondary | ICD-10-CM | POA: Diagnosis not present

## 2015-06-30 DIAGNOSIS — E1121 Type 2 diabetes mellitus with diabetic nephropathy: Secondary | ICD-10-CM | POA: Diagnosis not present

## 2015-06-30 DIAGNOSIS — I129 Hypertensive chronic kidney disease with stage 1 through stage 4 chronic kidney disease, or unspecified chronic kidney disease: Secondary | ICD-10-CM | POA: Diagnosis not present

## 2015-06-30 DIAGNOSIS — E11319 Type 2 diabetes mellitus with unspecified diabetic retinopathy without macular edema: Secondary | ICD-10-CM | POA: Diagnosis not present

## 2015-06-30 DIAGNOSIS — R809 Proteinuria, unspecified: Secondary | ICD-10-CM | POA: Diagnosis not present

## 2015-06-30 DIAGNOSIS — N250 Renal osteodystrophy: Secondary | ICD-10-CM | POA: Diagnosis not present

## 2015-07-02 DIAGNOSIS — S83242D Other tear of medial meniscus, current injury, left knee, subsequent encounter: Secondary | ICD-10-CM | POA: Diagnosis not present

## 2015-07-07 IMAGING — CR DG CHEST 1V PORT
1 series · 1 of 1 positions shown · non-contrast
Comparison: 03/27/2012

CLINICAL DATA: Chest pain and shortness of breath.

EXAM:
PORTABLE CHEST - 1 VIEW

[AP]
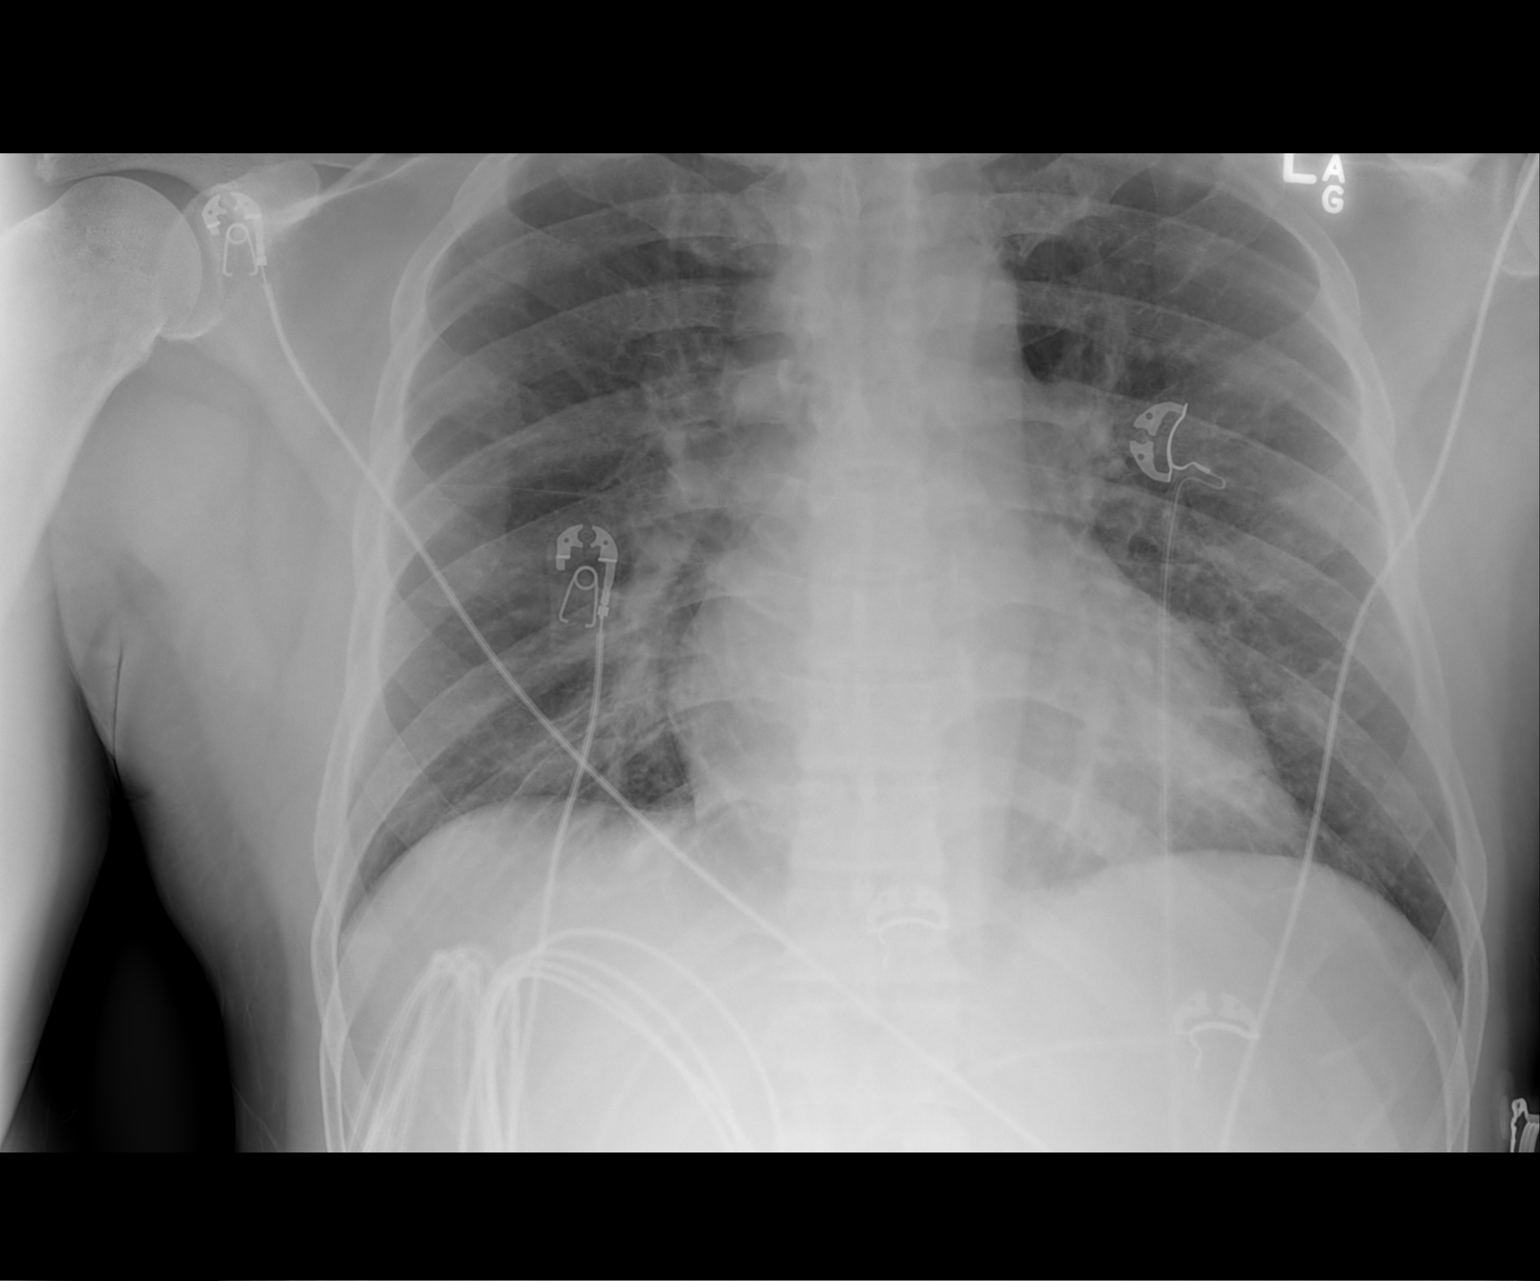

[1 of 1 positions shown; findings below may reference images not displayed]

FINDINGS: Shallow inspiration. Normal heart size and pulmonary vascularity.
Suggestion of peribronchial thickening consistent with bronchiolitis
or reactive airways disease. No focal consolidation or airspace
disease. No blunting of costophrenic angles. No pneumothorax.
IMPRESSION: Bronchitic changes suggesting bronchiolitis or reactive airways
disease. No focal consolidation. Normal heart size.

## 2015-07-24 DIAGNOSIS — E113511 Type 2 diabetes mellitus with proliferative diabetic retinopathy with macular edema, right eye: Secondary | ICD-10-CM | POA: Diagnosis not present

## 2015-07-26 DIAGNOSIS — R21 Rash and other nonspecific skin eruption: Secondary | ICD-10-CM | POA: Diagnosis not present

## 2015-07-26 DIAGNOSIS — L298 Other pruritus: Secondary | ICD-10-CM | POA: Diagnosis not present

## 2015-07-26 DIAGNOSIS — E139 Other specified diabetes mellitus without complications: Secondary | ICD-10-CM | POA: Diagnosis not present

## 2015-07-26 DIAGNOSIS — I10 Essential (primary) hypertension: Secondary | ICD-10-CM | POA: Diagnosis not present

## 2015-08-05 DIAGNOSIS — N529 Male erectile dysfunction, unspecified: Secondary | ICD-10-CM | POA: Diagnosis not present

## 2015-08-06 DIAGNOSIS — H40013 Open angle with borderline findings, low risk, bilateral: Secondary | ICD-10-CM | POA: Diagnosis not present

## 2015-08-06 DIAGNOSIS — Z83511 Family history of glaucoma: Secondary | ICD-10-CM | POA: Diagnosis not present

## 2015-08-14 DIAGNOSIS — I10 Essential (primary) hypertension: Secondary | ICD-10-CM | POA: Diagnosis not present

## 2015-08-14 DIAGNOSIS — E1140 Type 2 diabetes mellitus with diabetic neuropathy, unspecified: Secondary | ICD-10-CM | POA: Diagnosis not present

## 2015-08-14 DIAGNOSIS — T63001A Toxic effect of unspecified snake venom, accidental (unintentional), initial encounter: Secondary | ICD-10-CM | POA: Diagnosis not present

## 2015-08-14 DIAGNOSIS — N182 Chronic kidney disease, stage 2 (mild): Secondary | ICD-10-CM | POA: Diagnosis not present

## 2015-08-28 DIAGNOSIS — E113412 Type 2 diabetes mellitus with severe nonproliferative diabetic retinopathy with macular edema, left eye: Secondary | ICD-10-CM | POA: Diagnosis not present

## 2015-08-28 DIAGNOSIS — E113511 Type 2 diabetes mellitus with proliferative diabetic retinopathy with macular edema, right eye: Secondary | ICD-10-CM | POA: Diagnosis not present

## 2015-09-17 DIAGNOSIS — E1122 Type 2 diabetes mellitus with diabetic chronic kidney disease: Secondary | ICD-10-CM | POA: Diagnosis not present

## 2015-09-17 DIAGNOSIS — I129 Hypertensive chronic kidney disease with stage 1 through stage 4 chronic kidney disease, or unspecified chronic kidney disease: Secondary | ICD-10-CM | POA: Diagnosis not present

## 2015-09-17 DIAGNOSIS — I4891 Unspecified atrial fibrillation: Secondary | ICD-10-CM | POA: Diagnosis not present

## 2015-09-17 DIAGNOSIS — I2510 Atherosclerotic heart disease of native coronary artery without angina pectoris: Secondary | ICD-10-CM | POA: Diagnosis not present

## 2015-09-22 DIAGNOSIS — M25512 Pain in left shoulder: Secondary | ICD-10-CM | POA: Diagnosis not present

## 2015-09-30 DIAGNOSIS — I129 Hypertensive chronic kidney disease with stage 1 through stage 4 chronic kidney disease, or unspecified chronic kidney disease: Secondary | ICD-10-CM | POA: Diagnosis not present

## 2015-09-30 DIAGNOSIS — E1122 Type 2 diabetes mellitus with diabetic chronic kidney disease: Secondary | ICD-10-CM | POA: Diagnosis not present

## 2015-09-30 DIAGNOSIS — T464X6A Underdosing of angiotensin-converting-enzyme inhibitors, initial encounter: Secondary | ICD-10-CM | POA: Diagnosis not present

## 2015-09-30 DIAGNOSIS — N184 Chronic kidney disease, stage 4 (severe): Secondary | ICD-10-CM | POA: Diagnosis not present

## 2015-09-30 DIAGNOSIS — E1121 Type 2 diabetes mellitus with diabetic nephropathy: Secondary | ICD-10-CM | POA: Diagnosis not present

## 2015-10-09 DIAGNOSIS — E113412 Type 2 diabetes mellitus with severe nonproliferative diabetic retinopathy with macular edema, left eye: Secondary | ICD-10-CM | POA: Diagnosis not present

## 2015-10-09 DIAGNOSIS — E113511 Type 2 diabetes mellitus with proliferative diabetic retinopathy with macular edema, right eye: Secondary | ICD-10-CM | POA: Diagnosis not present

## 2015-10-30 DIAGNOSIS — N182 Chronic kidney disease, stage 2 (mild): Secondary | ICD-10-CM | POA: Diagnosis not present

## 2015-10-30 DIAGNOSIS — Z23 Encounter for immunization: Secondary | ICD-10-CM | POA: Diagnosis not present

## 2015-10-30 DIAGNOSIS — I10 Essential (primary) hypertension: Secondary | ICD-10-CM | POA: Diagnosis not present

## 2015-10-30 DIAGNOSIS — E1140 Type 2 diabetes mellitus with diabetic neuropathy, unspecified: Secondary | ICD-10-CM | POA: Diagnosis not present

## 2015-10-30 DIAGNOSIS — L219 Seborrheic dermatitis, unspecified: Secondary | ICD-10-CM | POA: Diagnosis not present

## 2015-11-04 DIAGNOSIS — E1122 Type 2 diabetes mellitus with diabetic chronic kidney disease: Secondary | ICD-10-CM | POA: Diagnosis not present

## 2015-11-04 DIAGNOSIS — E1121 Type 2 diabetes mellitus with diabetic nephropathy: Secondary | ICD-10-CM | POA: Diagnosis not present

## 2015-11-04 DIAGNOSIS — N184 Chronic kidney disease, stage 4 (severe): Secondary | ICD-10-CM | POA: Diagnosis not present

## 2015-11-04 DIAGNOSIS — I129 Hypertensive chronic kidney disease with stage 1 through stage 4 chronic kidney disease, or unspecified chronic kidney disease: Secondary | ICD-10-CM | POA: Diagnosis not present

## 2015-11-20 DIAGNOSIS — E113511 Type 2 diabetes mellitus with proliferative diabetic retinopathy with macular edema, right eye: Secondary | ICD-10-CM | POA: Diagnosis not present

## 2015-12-25 DIAGNOSIS — E113511 Type 2 diabetes mellitus with proliferative diabetic retinopathy with macular edema, right eye: Secondary | ICD-10-CM | POA: Diagnosis not present

## 2016-01-27 DIAGNOSIS — E1140 Type 2 diabetes mellitus with diabetic neuropathy, unspecified: Secondary | ICD-10-CM | POA: Diagnosis not present

## 2016-01-27 DIAGNOSIS — E784 Other hyperlipidemia: Secondary | ICD-10-CM | POA: Diagnosis not present

## 2016-01-27 DIAGNOSIS — J069 Acute upper respiratory infection, unspecified: Secondary | ICD-10-CM | POA: Diagnosis not present

## 2016-01-27 DIAGNOSIS — I10 Essential (primary) hypertension: Secondary | ICD-10-CM | POA: Diagnosis not present

## 2016-01-29 DIAGNOSIS — E113511 Type 2 diabetes mellitus with proliferative diabetic retinopathy with macular edema, right eye: Secondary | ICD-10-CM | POA: Diagnosis not present

## 2016-02-03 DIAGNOSIS — N184 Chronic kidney disease, stage 4 (severe): Secondary | ICD-10-CM | POA: Diagnosis not present

## 2016-02-03 DIAGNOSIS — I129 Hypertensive chronic kidney disease with stage 1 through stage 4 chronic kidney disease, or unspecified chronic kidney disease: Secondary | ICD-10-CM | POA: Diagnosis not present

## 2016-02-13 DIAGNOSIS — E119 Type 2 diabetes mellitus without complications: Secondary | ICD-10-CM | POA: Diagnosis not present

## 2016-03-18 ENCOUNTER — Inpatient Hospital Stay (HOSPITAL_COMMUNITY): Payer: BLUE CROSS/BLUE SHIELD

## 2016-03-18 ENCOUNTER — Inpatient Hospital Stay (HOSPITAL_COMMUNITY)
Admission: EM | Admit: 2016-03-18 | Discharge: 2016-03-20 | DRG: 683 | Disposition: A | Discharge status: Home / Self Care | Payer: BLUE CROSS/BLUE SHIELD | Attending: Family Medicine | Admitting: Family Medicine

## 2016-03-18 ENCOUNTER — Emergency Department (HOSPITAL_COMMUNITY): Payer: BLUE CROSS/BLUE SHIELD

## 2016-03-18 ENCOUNTER — Encounter (HOSPITAL_COMMUNITY): Payer: Self-pay | Admitting: Emergency Medicine

## 2016-03-18 DIAGNOSIS — Z7984 Long term (current) use of oral hypoglycemic drugs: Secondary | ICD-10-CM

## 2016-03-18 DIAGNOSIS — K529 Noninfective gastroenteritis and colitis, unspecified: Secondary | ICD-10-CM

## 2016-03-18 DIAGNOSIS — E7800 Pure hypercholesterolemia, unspecified: Secondary | ICD-10-CM

## 2016-03-18 DIAGNOSIS — E162 Hypoglycemia, unspecified: Secondary | ICD-10-CM

## 2016-03-18 DIAGNOSIS — N179 Acute kidney failure, unspecified: Principal | ICD-10-CM | POA: Diagnosis present

## 2016-03-18 DIAGNOSIS — E11649 Type 2 diabetes mellitus with hypoglycemia without coma: Secondary | ICD-10-CM | POA: Diagnosis present

## 2016-03-18 DIAGNOSIS — E1122 Type 2 diabetes mellitus with diabetic chronic kidney disease: Secondary | ICD-10-CM | POA: Diagnosis not present

## 2016-03-18 DIAGNOSIS — E1169 Type 2 diabetes mellitus with other specified complication: Secondary | ICD-10-CM

## 2016-03-18 DIAGNOSIS — I480 Paroxysmal atrial fibrillation: Secondary | ICD-10-CM | POA: Diagnosis not present

## 2016-03-18 DIAGNOSIS — T370X5A Adverse effect of sulfonamides, initial encounter: Secondary | ICD-10-CM

## 2016-03-18 DIAGNOSIS — I129 Hypertensive chronic kidney disease with stage 1 through stage 4 chronic kidney disease, or unspecified chronic kidney disease: Secondary | ICD-10-CM | POA: Diagnosis not present

## 2016-03-18 DIAGNOSIS — Z833 Family history of diabetes mellitus: Secondary | ICD-10-CM

## 2016-03-18 DIAGNOSIS — A084 Viral intestinal infection, unspecified: Secondary | ICD-10-CM | POA: Diagnosis present

## 2016-03-18 DIAGNOSIS — E860 Dehydration: Secondary | ICD-10-CM

## 2016-03-18 DIAGNOSIS — I5032 Chronic diastolic (congestive) heart failure: Secondary | ICD-10-CM | POA: Diagnosis not present

## 2016-03-18 DIAGNOSIS — Z981 Arthrodesis status: Secondary | ICD-10-CM | POA: Diagnosis not present

## 2016-03-18 DIAGNOSIS — I130 Hypertensive heart and chronic kidney disease with heart failure and stage 1 through stage 4 chronic kidney disease, or unspecified chronic kidney disease: Secondary | ICD-10-CM | POA: Diagnosis not present

## 2016-03-18 DIAGNOSIS — R0602 Shortness of breath: Secondary | ICD-10-CM | POA: Diagnosis not present

## 2016-03-18 DIAGNOSIS — N2889 Other specified disorders of kidney and ureter: Secondary | ICD-10-CM | POA: Diagnosis not present

## 2016-03-18 DIAGNOSIS — N183 Chronic kidney disease, stage 3 unspecified: Secondary | ICD-10-CM | POA: Diagnosis present

## 2016-03-18 DIAGNOSIS — I2510 Atherosclerotic heart disease of native coronary artery without angina pectoris: Secondary | ICD-10-CM | POA: Diagnosis not present

## 2016-03-18 DIAGNOSIS — Z7901 Long term (current) use of anticoagulants: Secondary | ICD-10-CM

## 2016-03-18 DIAGNOSIS — N189 Chronic kidney disease, unspecified: Secondary | ICD-10-CM

## 2016-03-18 DIAGNOSIS — R112 Nausea with vomiting, unspecified: Secondary | ICD-10-CM

## 2016-03-18 DIAGNOSIS — R197 Diarrhea, unspecified: Secondary | ICD-10-CM

## 2016-03-18 DIAGNOSIS — E861 Hypovolemia: Secondary | ICD-10-CM | POA: Diagnosis present

## 2016-03-18 DIAGNOSIS — Z79899 Other long term (current) drug therapy: Secondary | ICD-10-CM

## 2016-03-18 DIAGNOSIS — F29 Unspecified psychosis not due to a substance or known physiological condition: Secondary | ICD-10-CM | POA: Diagnosis not present

## 2016-03-18 DIAGNOSIS — R05 Cough: Secondary | ICD-10-CM | POA: Diagnosis not present

## 2016-03-18 DIAGNOSIS — Z880 Allergy status to penicillin: Secondary | ICD-10-CM | POA: Diagnosis not present

## 2016-03-18 DIAGNOSIS — F0391 Unspecified dementia with behavioral disturbance: Secondary | ICD-10-CM | POA: Diagnosis not present

## 2016-03-18 DIAGNOSIS — I10 Essential (primary) hypertension: Secondary | ICD-10-CM

## 2016-03-18 LAB — BASIC METABOLIC PANEL
Anion gap: 7 (ref 5–15)
BUN: 76 mg/dL — ABNORMAL HIGH (ref 6–20)
CO2: 15 mmol/L — ABNORMAL LOW (ref 22–32)
Calcium: 7.9 mg/dL — ABNORMAL LOW (ref 8.9–10.3)
Chloride: 113 mmol/L — ABNORMAL HIGH (ref 101–111)
Creatinine, Ser: 5.38 mg/dL — ABNORMAL HIGH (ref 0.61–1.24)
GFR calc Af Amer: 12 mL/min — ABNORMAL LOW (ref >60)
GFR calc non Af Amer: 10 mL/min — ABNORMAL LOW (ref >60)
Glucose, Bld: 66 mg/dL (ref 65–99)
Potassium: 4.9 mmol/L (ref 3.5–5.1)
Sodium: 135 mmol/L (ref 135–145)

## 2016-03-18 LAB — PHOSPHORUS: Phosphorus: 3.9 mg/dL (ref 2.5–4.6)

## 2016-03-18 LAB — COMPREHENSIVE METABOLIC PANEL
ALT: 24 U/L (ref 17–63)
AST: 22 U/L (ref 15–41)
Albumin: 3.9 g/dL (ref 3.5–5.0)
Alkaline Phosphatase: 75 U/L (ref 38–126)
Anion gap: 11 (ref 5–15)
BUN: 79 mg/dL — ABNORMAL HIGH (ref 6–20)
CO2: 17 mmol/L — ABNORMAL LOW (ref 22–32)
Calcium: 8.4 mg/dL — ABNORMAL LOW (ref 8.9–10.3)
Chloride: 107 mmol/L (ref 101–111)
Creatinine, Ser: 6.35 mg/dL — ABNORMAL HIGH (ref 0.61–1.24)
GFR calc Af Amer: 10 mL/min — ABNORMAL LOW (ref >60)
GFR calc non Af Amer: 9 mL/min — ABNORMAL LOW (ref >60)
Glucose, Bld: 36 mg/dL — CL (ref 65–99)
Potassium: 4.7 mmol/L (ref 3.5–5.1)
Sodium: 135 mmol/L (ref 135–145)
Total Bilirubin: 0.5 mg/dL (ref 0.3–1.2)
Total Protein: 8 g/dL (ref 6.5–8.1)

## 2016-03-18 LAB — LIPASE, BLOOD: Lipase: 25 U/L (ref 11–51)

## 2016-03-18 LAB — CBG MONITORING, ED
Glucose-Capillary: 40 mg/dL — CL (ref 65–99)
Glucose-Capillary: 161 mg/dL — ABNORMAL HIGH (ref 65–99)
Glucose-Capillary: 33 mg/dL — CL (ref 65–99)
Glucose-Capillary: 146 mg/dL — ABNORMAL HIGH (ref 65–99)
Glucose-Capillary: 94 mg/dL (ref 65–99)
Glucose-Capillary: 77 mg/dL (ref 65–99)
Glucose-Capillary: 79 mg/dL (ref 65–99)

## 2016-03-18 LAB — URINALYSIS, ROUTINE W REFLEX MICROSCOPIC
Bacteria, UA: NONE SEEN
Bilirubin Urine: NEGATIVE
Glucose, UA: NEGATIVE mg/dL
Ketones, ur: NEGATIVE mg/dL
Leukocytes, UA: NEGATIVE
Nitrite: NEGATIVE
Protein, ur: 100 mg/dL — AB
Specific Gravity, Urine: 1.013 (ref 1.005–1.030)
Squamous Epithelial / LPF: NONE SEEN
pH: 5 (ref 5.0–8.0)

## 2016-03-18 LAB — GLUCOSE, CAPILLARY
Glucose-Capillary: 29 mg/dL — CL (ref 65–99)
Glucose-Capillary: 115 mg/dL — ABNORMAL HIGH (ref 65–99)
Glucose-Capillary: 42 mg/dL — CL (ref 65–99)
Glucose-Capillary: 91 mg/dL (ref 65–99)
Glucose-Capillary: 139 mg/dL — ABNORMAL HIGH (ref 65–99)
Glucose-Capillary: 49 mg/dL — ABNORMAL LOW (ref 65–99)
Glucose-Capillary: 87 mg/dL (ref 65–99)
Glucose-Capillary: 88 mg/dL (ref 65–99)
Glucose-Capillary: 103 mg/dL — ABNORMAL HIGH (ref 65–99)
Glucose-Capillary: 105 mg/dL — ABNORMAL HIGH (ref 65–99)
Glucose-Capillary: 104 mg/dL — ABNORMAL HIGH (ref 65–99)

## 2016-03-18 LAB — TROPONIN I: Troponin I: <0.03 ng/mL (ref <0.03)

## 2016-03-18 LAB — CBC
HCT: 40.5 % (ref 39.0–52.0)
Hemoglobin: 14.4 g/dL (ref 13.0–17.0)
MCH: 26.3 pg (ref 26.0–34.0)
MCHC: 35.6 g/dL (ref 30.0–36.0)
MCV: 74 fL — ABNORMAL LOW (ref 78.0–100.0)
Platelets: 154 10*3/uL (ref 150–400)
RBC: 5.47 MIL/uL (ref 4.22–5.81)
RDW: 13.8 % (ref 11.5–15.5)
WBC: 6.8 10*3/uL (ref 4.0–10.5)

## 2016-03-18 LAB — MAGNESIUM: Magnesium: 1.8 mg/dL (ref 1.7–2.4)

## 2016-03-18 LAB — APTT
aPTT: 32 seconds (ref 24–36)
aPTT: 107 seconds — ABNORMAL HIGH (ref 24–36)

## 2016-03-18 LAB — SODIUM, URINE, RANDOM: Sodium, Ur: 17 mmol/L

## 2016-03-18 LAB — BRAIN NATRIURETIC PEPTIDE: B Natriuretic Peptide: 11.3 pg/mL (ref 0.0–100.0)

## 2016-03-18 LAB — HEPARIN LEVEL (UNFRACTIONATED)
Heparin Unfractionated: 1.86 IU/mL — ABNORMAL HIGH (ref 0.30–0.70)
Heparin Unfractionated: 1.08 IU/mL — ABNORMAL HIGH (ref 0.30–0.70)

## 2016-03-18 LAB — CREATININE, URINE, RANDOM: Creatinine, Urine: 323.62 mg/dL

## 2016-03-18 MED ORDER — HEPARIN (PORCINE) IN NACL 100-0.45 UNIT/ML-% IJ SOLN
1200.0000 [IU]/h | INTRAMUSCULAR | Status: DC
  Administered 2016-03-18: 1200 [IU]/h via INTRAVENOUS
  Filled 2016-03-18: qty 250

## 2016-03-18 MED ORDER — ACETAMINOPHEN 500 MG PO TABS: 1000.0000 mg | ORAL_TABLET | Freq: Four times a day (QID) | ORAL | Status: DC | PRN

## 2016-03-18 MED ORDER — TRAMADOL HCL 50 MG PO TABS
50.0000 mg | ORAL_TABLET | Freq: Four times a day (QID) | ORAL | Status: DC | PRN
  Administered 2016-03-18: 50 mg via ORAL
  Filled 2016-03-18: qty 1

## 2016-03-18 MED ORDER — ONDANSETRON HCL 4 MG/2ML IJ SOLN: 4.0000 mg | Freq: Once | INTRAMUSCULAR | Status: DC | PRN

## 2016-03-18 MED ORDER — DEXTROSE-NACL 5-0.45 % IV SOLN
INTRAVENOUS | Status: DC
  Administered 2016-03-18 – 2016-03-20 (×3): via INTRAVENOUS

## 2016-03-18 MED ORDER — GI COCKTAIL ~~LOC~~
30.0000 mL | Freq: Once | ORAL | Status: AC
  Administered 2016-03-18: 30 mL via ORAL
  Filled 2016-03-18: qty 30

## 2016-03-18 MED ORDER — ACETAMINOPHEN 10 MG/ML IV SOLN
1000.0000 mg | Freq: Once | INTRAVENOUS | Status: AC
  Administered 2016-03-18: 1000 mg via INTRAVENOUS
  Filled 2016-03-18: qty 100

## 2016-03-18 MED ORDER — OXYCODONE HCL 5 MG PO TABS: 5.0000 mg | ORAL_TABLET | Freq: Four times a day (QID) | ORAL | Status: DC | PRN

## 2016-03-18 MED ORDER — FAMOTIDINE IN NACL 20-0.9 MG/50ML-% IV SOLN
20.0000 mg | INTRAVENOUS | Status: AC
  Administered 2016-03-18: 20 mg via INTRAVENOUS
  Filled 2016-03-18: qty 50

## 2016-03-18 MED ORDER — HEPARIN (PORCINE) IN NACL 100-0.45 UNIT/ML-% IJ SOLN
1100.0000 [IU]/h | INTRAMUSCULAR | Status: DC
  Administered 2016-03-18 – 2016-03-19 (×2): 1100 [IU]/h via INTRAVENOUS
  Filled 2016-03-18: qty 250

## 2016-03-18 MED ORDER — FAMOTIDINE IN NACL 20-0.9 MG/50ML-% IV SOLN
20.0000 mg | Freq: Two times a day (BID) | INTRAVENOUS | Status: DC
  Administered 2016-03-18 – 2016-03-19 (×3): 20 mg via INTRAVENOUS
  Filled 2016-03-18 (×3): qty 50

## 2016-03-18 MED ORDER — DEXTROSE 50 % IV SOLN
INTRAVENOUS | Status: AC
  Administered 2016-03-18: 25 mL via INTRAVENOUS
  Filled 2016-03-18: qty 50

## 2016-03-18 MED ORDER — ONDANSETRON HCL 4 MG/2ML IJ SOLN
4.0000 mg | Freq: Four times a day (QID) | INTRAMUSCULAR | Status: DC | PRN
  Administered 2016-03-19: 4 mg via INTRAVENOUS
  Filled 2016-03-18: qty 2

## 2016-03-18 MED ORDER — DEXTROSE 50 % IV SOLN
25.0000 g | Freq: Once | INTRAVENOUS | Status: AC
  Administered 2016-03-18: 25 mL via INTRAVENOUS
  Administered 2016-03-18 (×2): 25 g via INTRAVENOUS

## 2016-03-18 MED ORDER — CARVEDILOL 25 MG PO TABS
25.0000 mg | ORAL_TABLET | Freq: Two times a day (BID) | ORAL | Status: DC
  Administered 2016-03-18 – 2016-03-19 (×3): 25 mg via ORAL
  Filled 2016-03-18 (×3): qty 1

## 2016-03-18 MED ORDER — SODIUM CHLORIDE 0.9 % IV SOLN
INTRAVENOUS | Status: DC
  Administered 2016-03-18: 10:00:00 via INTRAVENOUS

## 2016-03-18 MED ORDER — DEXTROSE 50 % IV SOLN
INTRAVENOUS | Status: AC
  Filled 2016-03-18: qty 50

## 2016-03-18 MED ORDER — DEXTROSE 50 % IV SOLN
INTRAVENOUS | Status: AC
  Administered 2016-03-18: 25 mL
  Filled 2016-03-18: qty 50

## 2016-03-18 MED ORDER — ONDANSETRON HCL 4 MG PO TABS: 4.0000 mg | ORAL_TABLET | Freq: Four times a day (QID) | ORAL | Status: DC | PRN

## 2016-03-18 MED ORDER — SODIUM CHLORIDE 0.9 % IV BOLUS (SEPSIS)
2000.0000 mL | Freq: Once | INTRAVENOUS | Status: AC
  Administered 2016-03-18: 2000 mL via INTRAVENOUS

## 2016-03-18 MED ORDER — ONDANSETRON HCL 4 MG/2ML IJ SOLN
4.0000 mg | Freq: Once | INTRAMUSCULAR | Status: AC
  Administered 2016-03-18: 4 mg via INTRAVENOUS
  Filled 2016-03-18: qty 2

## 2016-03-18 NOTE — Consult Note (Signed)
Renal Service Consult Note [facility]  Barry Lopez 03/18/2016 [place] Requesting Physician:  Dr. Elon Jester  Reason for Consult:  Renal failure HPI: The patient is a 61 y.o. year-old with history of DMD2, PAF, HTN , CKD 3, chron diast CHF presenting to ED overnight with 2-3 day history of N/V a nd some diarrhea.  Whole family sick with same thing.  Creat 6.35 in ED.  Asked to see for renal failure.   Last creat here was 1.8 in 2016.  F/B nephrologist at [facility], doesn't know his kidney function but they don't talk about dialysis and he thinks he may be stage "3".  No nsaid's.  Recent GI illness as above.    Home meds > eliquis, coreg, amaryl, lisinopril, pregabalin, vitamins  Pt grew up here , graduated [employer] 12th grade.  1 yr at [employer].  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  Married , two grown children.  No tob / etoh.  Works for [employer] in [place], in shippin/ receiving dept.        ROS  denies CP  no joint pain   no HA  no blurry vision  no rash  no diarrhea  no nausea/ vomiting  no dysuria  no difficulty voiding  no change in urine color    Past Medical History  Past Medical History:  Diagnosis Date  . CHF (congestive heart failure) ([place])   . Chronic diastolic heart failure (HCC)    a. in the setting of a-fib --> Nonobstructive cath 09/06/2013 30% LAD.   2-D ECHO: Normal LV size and thickness. Mild LA dilatation. EF ~65%, no regional WMA  . CKD stage 3 due to type 2 diabetes mellitus (HCC)    Cr ~1.9  . Degenerative cervical disc   . Degenerative disc disease   . Essential hypertension   . PAF (paroxysmal atrial fibrillation) ([place])   . Type 2 diabetes mellitus with hypercholesterolemia [facility])    Past Surgical History  Past Surgical History:  Procedure Laterality Date  . ANTERIOR CERVICAL DECOMP/DISCECTOMY FUSION  12/17/2011   Procedure: ANTERIOR CERVICAL DECOMPRESSION/DISCECTOMY FUSION 1 LEVEL;  Surgeon: Erline Levine, MD;  Location: [place] NEURO ORS;  Service: Neurosurgery;  Laterality: N/A;  Exploration of Fusion and Anterior Cervical Six-Seven Decompression/Diskectomy/Fusion  . BOWEL RESECTION    . CARDIAC CATHETERIZATION  09/06/2013   30% LAD. Otherwise nonobstructive CAD  . CHOLECYSTECTOMY    . HERNIA REPAIR     YEARS AGO  . LEFT HEART CATHETERIZATION WITH CORONARY ANGIOGRAM N/A 09/06/2013   Procedure: LEFT HEART CATHETERIZATION WITH CORONARY ANGIOGRAM;  Surgeon: Leonie Man, MD;  Location: [facility] CATH LAB;  Service: Cardiovascular;  Laterality: N/A;  . NECK SURGERY    . POSTERIOR CERVICAL FUSION/FORAMINOTOMY  12/17/2011   Procedure: POSTERIOR CERVICAL FUSION/FORAMINOTOMY LEVEL 4;  Surgeon: Erline Levine, MD;  Location: [facility]UR[facility]S;  Service: Neurosurgery;;  Posterior Cervical Three-Seven Fusion  . TONSILLECTOMY     Family History  Family History  Problem Relation Age of Onset  . Heart failure Mother   . Diabetes Mellitus II Mother   . Diabetes Mellitus II Sister    Social History  reports that he has never smoked. He has never used smokeless tobacco. He reports that he does not drink alcohol or use drugs. Allergies  Allergies  Allergen Reactions  . Penicillins Hives, Itching and Other (See Comments)    Has patient had a PCN reaction causing immediate rash, facial/tongue/throat swelling, SOB  or lightheadedness with hypotension: No Has patient had a PCN reaction causing severe rash involving mucus membranes or skin necrosis: No Has patient had a PCN reaction that required hospitalization No Has patient had a PCN reaction occurring within the last 10 years: No If all of the above answers are "NO", then may proceed with Cephalosporin use.   Home medications Prior to Admission medications   Medication Sig Start Date End Date Taking? Authorizing Provider  acetaminophen (TYLENOL) 500 MG tablet Take 1,000 mg by mouth every 6 (six) hours as needed for mild pain, moderate pain or headache.   Yes  Historical Provider, MD  apixaban (ELIQUIS) 5 MG TABS tablet Take 1 tablet (5 mg total) by mouth 2 (two) times daily. 10/01/13  Yes Imogene Burn, PA-C  carvedilol (COREG) 25 MG tablet Take 25 mg by mouth 2 (two) times daily with a meal.   Yes Historical Provider, MD  glimepiride (AMARYL) 4 MG tablet Take 4 mg by mouth 2 (two) times daily.   Yes Historical Provider, MD  lisinopril (PRINIVIL,ZESTRIL) 40 MG tablet Take 40 mg by mouth daily.   Yes Historical Provider, MD  pregabalin (LYRICA) 100 MG capsule Take 100 mg by mouth 2 (two) times daily as needed (for pain.).    Yes Historical Provider, MD  Vitamin D, Ergocalciferol, (DRISDOL) 50000 UNITS CAPS capsule Take 50,000 Units by mouth every Saturday.    Yes Historical Provider, MD   Liver Function Tests  Recent Labs Lab 03/18/16 0550  AST 22  ALT 24  ALKPHOS 75  BILITOT 0.5  PROT 8.0  ALBUMIN 3.9    Recent Labs Lab 03/18/16 0550  LIPASE 25   CBC  Recent Labs Lab 03/18/16 0550  WBC 6.8  HGB 14.4  HCT 40.5  MCV 74.0*  PLT 646   Basic Metabolic Panel  Recent Labs Lab 03/18/16 0550 03/18/16 0941  NA 135 135  K 4.7 4.9  CL 107 113*  CO2 17* 15*  GLUCOSE 36* 66  BUN 79* 76*  CREATININE 6.35* 5.38*  CALCIUM 8.4* 7.9*  PHOS  --  3.9   Iron/TIBC/Ferritin/ %Sat No results found for: IRON, TIBC, FERRITIN, IRONPCTSAT  Vitals:   03/18/16 0830 03/18/16 0859 03/18/16 0930 03/18/16 1000  BP: (!) 106/40 142/83 (!) 153/75   Pulse: 89 88 94   Resp: 20 22 20    Temp:  98.1 F (36.7 C) 97.4 F (36.3 C)   TempSrc:  Oral Tympanic   SpO2: 99% 100% 100%   Weight:    84.7 kg (186 lb 11.7 oz)  Height:    5\' 7"  (1.702 m)   Exam Gen alert, WDWN, no distress, calm No rash, cyanosis or gangrene Sclera anicteric, throat clear and moist  No jvd or bruits Chest clear bilat RRR no MRG Abd soft ntnd no mass or ascites +bs, no fem bruits GU normal male MS no joint effusions or deformity Ext no LE edema / no wounds or  ulcers Neuro is alert, Ox 3 , nf  UA 6-30 rbc, 0-5 wbc, no epi, prot 100 Renal US > 11-12 cm kidneys with Edison Pace, no hydro  Assessment: 1. Acute on CKD - don't know baseline creat , was 1.8 in 2016.  Creat improving with IVF's.  Pt w dehydrating illnes and on ACEi, prob cause for AKI.  Continue IVF"s, cont to hold lisinopril and keep BP's up > 110.  Will follow.   2. CKD - due to DM 3. DM2 4. HTN 5. Hx  neck surgery  6. Hx diast CHF    Plan - as above.    Kelly Splinter MD [employer] pager [phone]   03/18/2016, 11:24 AM

## 2016-03-18 NOTE — Progress Notes (Signed)
Hypoglycemic Event  CBG: 42  Treatment: D50 IV 25 mL  Symptoms: None  Follow-up CBG: Time:1700 CBG Result:91  Possible Reasons for Event: Unknown  Comments/MD notified:Dr. Shirline Frees, Larence Penning

## 2016-03-18 NOTE — ED Provider Notes (Signed)
[facility] DEPT Provider Note   CSN: [id number] Arrival date & time: 03/18/16  0932     History   Chief Complaint Chief Complaint  Patient presents with  . Hypoglycemia    HPI   Blood pressure 139/83, pulse 106, temperature 97.7 F (36.5 C), temperature source Oral, resp. rate 18, SpO2 95 %.   Barry Lopez is a 61 y.o. male with past medical history significant for nonCelinda dependent diabetes (he takes glipizide) he was altered and confused this morning as per wife who supplies most of the history, she states that he checked she checked his blood sugar at home and it was 20, when she got to the emergency department went down to 33. States that he has not been in his normal state of health lately but there hasn't he's had nausea vomiting diarrhea with tactile fever over the last several days. Last episode of emesis was yesterday, he is continued to take his medications. He denies melena, hematochezia, coffee ground emesis. He is anticoagulated with L Quist for paroxysmal A. fib. He has not had any palpitations or chest pain but he does get dyspneic on exertion which she's had for the last several months. He denies orthopnea and PND.   PCP [place] Cards: Ellyn Hack  Past Medical History:  Diagnosis Date  . Chronic diastolic heart failure (HCC)    a. in the setting of a-fib --> Nonobstructive cath 09/06/2013 30% LAD.   2-D ECHO: Normal LV size and thickness. Mild LA dilatation. EF ~65%, no regional WMA  . CKD stage 3 due to type 2 diabetes mellitus (HCC)    Cr ~1.9  . Degenerative cervical disc   . Degenerative disc disease   . Essential hypertension   . PAF (paroxysmal atrial fibrillation) ([place])   . Type 2 diabetes mellitus with hypercholesterolemia [facility])     Patient Active Problem List   Diagnosis Date Noted  . Acute renal failure (ARF) ([place]) 03/18/2016  . Pre-operative cardiovascular examination 05/25/2015  . Abnormal finding on EKG 05/25/2015  . Type 2 diabetes  mellitus with hypercholesterolemia ([place])   . PAF (paroxysmal atrial fibrillation) ([place])   . Chest pain 09/05/2013  . CKD (chronic kidney disease), stage III 09/05/2013  . Needs sleep apnea assessment 09/04/2013  . Chronic diastolic heart failure, NYHA class 1 ([place]) 09/04/2013  . Hypertriglyceridemia 03/28/2012  . Low grade fever 03/28/2012  . Acute pancreatitis 03/27/2012  . Essential hypertension 03/27/2012  . Hyperlipidemia with target LDL less than 100 03/27/2012    Past Surgical History:  Procedure Laterality Date  . ANTERIOR CERVICAL DECOMP/DISCECTOMY FUSION  12/17/2011   Procedure: ANTERIOR CERVICAL DECOMPRESSION/DISCECTOMY FUSION 1 LEVEL;  Surgeon: Erline Levine, MD;  Location: [facility];  Service: Neurosurgery;  Laterality: N/A;  Exploration of Fusion and Anterior Cervical Six-Seven Decompression/Diskectomy/Fusion  . BOWEL RESECTION    . CARDIAC CATHETERIZATION  09/06/2013   30% LAD. Otherwise nonobstructive CAD  . CHOLECYSTECTOMY    . HERNIA REPAIR     YEARS AGO  . LEFT HEART CATHETERIZATION WITH CORONARY ANGIOGRAM N/A 09/06/2013   Procedure: LEFT HEART CATHETERIZATION WITH CORONARY ANGIOGRAM;  Surgeon: Leonie Man, MD;  Location: [facility] CATH LAB;  Service: Cardiovascular;  Laterality: N/A;  . NECK SURGERY    . POSTERIOR CERVICAL FUSION/FORAMINOTOMY  12/17/2011   Procedure: POSTERIOR CERVICAL FUSION/FORAMINOTOMY LEVEL 4;  Surgeon: Erline Levine, MD;  Location: [facility]UR[facility] ORS;  Service: Neurosurgery;;  Posterior Cervical Three-Seven Fusion  . TONSILLECTOMY  Home Medications    Prior to Admission medications   Medication Sig Start Date End Date Taking? Authorizing Provider  acetaminophen (TYLENOL) 500 MG tablet Take 1,000 mg by mouth every 6 (six) hours as needed for mild pain, moderate pain or headache.   Yes Historical Provider, MD  apixaban (ELIQUIS) 5 MG TABS tablet Take 1 tablet (5 mg total) by mouth 2 (two) times daily. 10/01/13  Yes Imogene Burn, PA-C    carvedilol (COREG) 25 MG tablet Take 25 mg by mouth 2 (two) times daily with a meal.   Yes Historical Provider, MD  glimepiride (AMARYL) 4 MG tablet Take 4 mg by mouth 2 (two) times daily.   Yes Historical Provider, MD  lisinopril (PRINIVIL,ZESTRIL) 40 MG tablet Take 40 mg by mouth daily.   Yes Historical Provider, MD  pregabalin (LYRICA) 100 MG capsule Take 100 mg by mouth 2 (two) times daily as needed (for pain.).    Yes Historical Provider, MD  Vitamin D, Ergocalciferol, (DRISDOL) 50000 UNITS CAPS capsule Take 50,000 Units by mouth every Saturday.    Yes Historical Provider, MD    Family History Family History  Problem Relation Age of Onset  . Heart failure Mother   . Diabetes Mellitus II Mother   . Diabetes Mellitus II Sister     Social History Social History  Substance Use Topics  . Smoking status: Never Smoker  . Smokeless tobacco: Never Used  . Alcohol use No     Allergies   Penicillins   Review of Systems Review of Systems  10 systems reviewed and found to be negative, except as noted in the HPI.  Physical Exam Updated Vital Signs BP 139/83 (BP Location: Left Arm)   Pulse 106   Temp 97.7 F (36.5 C) (Oral)   Resp 18   SpO2 95%   Physical Exam  Constitutional: He is oriented to person, place, and time. He appears well-developed and well-nourished. No distress.  HENT:  Head: Normocephalic and atraumatic.  Mouth/Throat: Oropharynx is clear and moist.  Eyes: Conjunctivae and EOM are normal. Pupils are equal, round, and reactive to light.  Neck: Normal range of motion.  Cardiovascular: Regular rhythm and intact distal pulses.   Mild tachycardia  Pulmonary/Chest: Effort normal and breath sounds normal.  Abdominal: Soft. There is no tenderness.  Musculoskeletal: Normal range of motion.  Neurological: He is alert and oriented to person, place, and time.  Skin: He is not diaphoretic.  Psychiatric: He has a normal mood and affect.  Nursing note and vitals  reviewed.    ED Treatments / Results  Labs (all labs ordered are listed, but only abnormal results are displayed) Labs Reviewed  COMPREHENSIVE METABOLIC PANEL - Abnormal; Notable for the following:       Result Value   CO2 17 (*)    Glucose, Bld 36 (*)    BUN 79 (*)    Creatinine, Ser 6.35 (*)    Calcium 8.4 (*)    GFR calc non Af Amer 9 (*)    GFR calc Af Amer 10 (*)    All other components within normal limits  CBC - Abnormal; Notable for the following:    MCV 74.0 (*)    All other components within normal limits  URINALYSIS, ROUTINE W REFLEX MICROSCOPIC - Abnormal; Notable for the following:    APPearance HAZY (*)    Hgb urine dipstick SMALL (*)    Protein, ur 100 (*)    All other components within normal  limits  CBG MONITORING, ED - Abnormal; Notable for the following:    Glucose-Capillary 40 (*)    All other components within normal limits  CBG MONITORING, ED - Abnormal; Notable for the following:    Glucose-Capillary 33 (*)    All other components within normal limits  CBG MONITORING, ED - Abnormal; Notable for the following:    Glucose-Capillary 161 (*)    All other components within normal limits  CBG MONITORING, ED - Abnormal; Notable for the following:    Glucose-Capillary 146 (*)    All other components within normal limits  LIPASE, BLOOD  BRAIN NATRIURETIC PEPTIDE  TROPONIN I  CBG MONITORING, ED    EKG  EKG Interpretation  Date/Time:  Thursday March 18 2016 06:36:06 EST Ventricular Rate:  93 PR Interval:    QRS Duration: 83 QT Interval:  338 QTC Calculation: 421 R Axis:   31 Text Interpretation:  Sinus rhythm LAE, consider biatrial enlargement Repol abnrm suggests ischemia, anterolateral Borderline ST elevation, anterior leads no change vs comparison Confirmed by Jeneen Rinks  MD, [place] ([id number]) on 03/18/2016 7:44:47 AM       Radiology Dg Chest 2 View  Result Date: 03/18/2016 CLINICAL DATA:  Recent onset of cough, shortness of breath, chest congestion,  wheezing, and fever. History of diabetes, CHF, nonsmoker. EXAM: CHEST  2 VIEW COMPARISON:  Chest x-ray of July 24, 2014. FINDINGS: The lungs are adequately inflated and clear. The heart and pulmonary vascularity are normal. The mediastinum is normal in width. There is no pleural effusion. The trachea midline. The bony thorax exhibits no acute abnormality. IMPRESSION: There is no pneumonia nor other acute cardiopulmonary abnormality. Electronically Signed   By: David  Martinique M.D.   On: 03/18/2016 07:25    Procedures Procedures (including critical care time)  Medications Ordered in ED Medications  ondansetron (ZOFRAN) injection 4 mg (not administered)  dextrose 50 % solution 25 g (25 g Intravenous Given 03/18/16 0554)  famotidine (PEPCID) IVPB 20 mg premix (0 mg Intravenous Stopped 03/18/16 0736)  ondansetron (ZOFRAN) injection 4 mg (4 mg Intravenous Given 03/18/16 0700)  sodium chloride 0.9 % bolus 2,000 mL (2,000 mLs Intravenous New Bag/Given 03/18/16 0737)     Initial Impression / Assessment and Plan / ED Course  I have reviewed the triage vital signs and the nursing notes.  Pertinent labs & imaging results that were available during my care of the patient were reviewed by me and considered in my medical decision making (see chart for details).     Vitals:   03/18/16 0527 03/18/16 0528  BP: 139/83   Pulse: 106   Resp: 18 18  Temp: 97.9 F (36.6 C) 97.7 F (36.5 C)  TempSrc: Oral Oral  SpO2: 95%     Medications  ondansetron (ZOFRAN) injection 4 mg (not administered)  dextrose 50 % solution 25 g (25 g Intravenous Given 03/18/16 0554)  famotidine (PEPCID) IVPB 20 mg premix (0 mg Intravenous Stopped 03/18/16 0736)  ondansetron (ZOFRAN) injection 4 mg (4 mg Intravenous Given 03/18/16 0700)  sodium chloride 0.9 % bolus 2,000 mL (2,000 mLs Intravenous New Bag/Given 03/18/16 0737)    Barry Lopez is 61 y.o. male presenting with Hypoglycemia and confusion. Neurologic exam is grossly nonfocal, he  is oriented 3 on my exam. He's had nausea vomiting and diarrhea over the last several days, he is anticoagulated for A. fib but the emesis is not coffee ground or grossly bloody, no melena or hematochezia. He has a mild tachycardia on  my exam. He is reporting some dyspnea on exertion and intermittent chest pain none of which are active right now, this does not appear to be acute in nature however I will check a EKG, troponin, chest x-ray and BNP.  Patient is in acute renal failure, he has chronic renal failure with his creatinine normally 2, today it is over 6, patient is hydrated, this is probably why he was hypoglycemic and not probably metabolizing his glipizide. EKG with no acute changes, troponin proBNP negative.  Discussed with Dr. Quincy Simmonds who will admit the patient.  Final Clinical Impressions(s) / ED Diagnoses   Final diagnoses:  Hypoglycemia  Sulfonamide causing adverse effect in therapeutic use, initial encounter  Acute renal failure superimposed on chronic kidney disease, unspecified CKD stage, unspecified acute renal failure type (HCC)  Nausea vomiting and diarrhea  Dehydration    New Prescriptions New Prescriptions   No medications on file     Monico Blitz, PA-C 03/18/16 [place], MD 03/28/16 [phone]

## 2016-03-18 NOTE — Progress Notes (Signed)
Hypoglycemic Event  CBG:29  Treatment: D50 IV 25 mL  Symptoms: None  Follow-up CBG: Time:1340 CBG Result:115  Possible Reasons for Event: Inadequate meal intake and Unknown  Comments/MD notified:Dr. Shirline Frees, Larence Penning

## 2016-03-18 NOTE — Progress Notes (Signed)
ANTICOAGULATION CONSULT NOTE - Initial Consult  Pharmacy Consult for heparin Indication: afib (Holding apixaban for AKI)  Allergies  Allergen Reactions  . Penicillins Hives, Itching and Other (See Comments)    Has patient had a PCN reaction causing immediate rash, facial/tongue/throat swelling, SOB or lightheadedness with hypotension: No Has patient had a PCN reaction causing severe rash involving mucus membranes or skin necrosis: No Has patient had a PCN reaction that required hospitalization No Has patient had a PCN reaction occurring within the last 10 years: No If all of the above answers are "NO", then may proceed with Cephalosporin use.    Patient Measurements:   Heparin Dosing Weight: 83kg  Vital Signs: Temp: 98.1 F (36.7 C) (02/08 0859) Temp Source: Oral (02/08 0859) BP: 142/83 (02/08 0859) Pulse Rate: 88 (02/08 0859)  Labs:  Recent Labs  03/18/16 0550  HGB 14.4  HCT 40.5  PLT 154  CREATININE 6.35*  TROPONINI <0.03    CrCl cannot be calculated (Unknown ideal weight.).   Medical History: Past Medical History:  Diagnosis Date  . CHF (congestive heart failure) ([place])   . Chronic diastolic heart failure (HCC)    a. in the setting of a-fib --> Nonobstructive cath 09/06/2013 30% LAD.   2-D ECHO: Normal LV size and thickness. Mild LA dilatation. EF ~65%, no regional WMA  . CKD stage 3 due to type 2 diabetes mellitus (HCC)    Cr ~1.9  . Degenerative cervical disc   . Degenerative disc disease   . Essential hypertension   . PAF (paroxysmal atrial fibrillation) ([place])   . Type 2 diabetes mellitus with hypercholesterolemia ([place])     Assessment: 89 YOM presents with hypoglycemia.  On admission, Scr 6.35 at admission (baseline = 2). Has h/o afib on apixaban at home.  Due to AKI and avoid apixaban, start heparin until renal function improved.   - Last apixaban dose 2/7 evening - Baseline aPTT:  Heparin level: pending  Today, 03/18/2016   CBC: Hgb and pltc  WNL  Renal: AKI on CKD  No bleeding noted w/ recent vomiting and diarrhea   Goal of Therapy:  Heparin level 0.3-0.7 units/ml aPTT 66-102 seconds Monitor platelets by anticoagulation protocol: Yes   Plan:   Start heparin at 1200 units/hr (no bolus)  Check 8h aPTT  Daily heparin level and aPTT until they begin to correlate and apixaban eliminated  Daily CBC  Monitor for improvement in SCr and possibility of resuming apixaban  Doreene Eland, PharmD, BCPS.   Pager: [phone] 03/18/2016 9:42 AM

## 2016-03-18 NOTE — Progress Notes (Signed)
Pharmacy: [facility]  Patient's a 61 y.o M currently on heparin for afib (home Eliquis on hold).  APTT level now back slightly elevated at 107 secs (goal 66-102 secs) and heparin level 1.08 (elevated d/t residual effect of Eliquis)  Per RN, No bleeding noted.  Plan: - decrease heparin drip to 1100 units/hr - check 8 hr heparin level and aPTT (monitor both until the two correlate) - monitor for s/s bleeding  Dia Sitter, PharmD, BCPS 03/18/2016 8:52 PM

## 2016-03-18 NOTE — H&P (Addendum)
History and Physical    Barry Lopez XKG:[id number] DOB: Jan 24, 1956  DOA: 03/18/2016 PCP: Philis Fendt, MD  Patient coming from: Home   Chief Complaint: Confusion and low blood sugar   HPI: Barry Lopez is a 61 y.o. male with PMHx of PAF, CKD stage III, DM type II and HTN was brought to the ED due to confusion and low blood sugar. Apparently patient was delirious and check his blood sugar at home which was 77. Wife decided to bring him to the ED because of confusion state where his CBG was 33. By the time that I saw the patient he is back to baseline with normal mentation.  Patient reported that he has been dealing with persistent vomiting and diarrhea that started 3 days prior to admission. Associated symptoms are subjective fever, diffuse abdominal pain and anorexia. Even that patient has been with poor oral intake for the past 3 days he has continued to take his hypoglycemic medications. Patient denies bloody stools or emesis. He has not taken any medications for this and nothing has make it better. Denies recent travel outside the [place]. Does not recall eating street food.  ED Course: CBG were 33, creatinine 6.35. UA normal,   Review of Systems:   General: no changes in body weight, no decrease in energy.  HEENT: no blurry vision, hearing changes or sore throat Respiratory: no coughing, wheezing. Positive for dyspnea on and off  CV: no chest pain, no palpitations GI: See HPI  GU: no dysuria, burning on urination, increased urinary frequency, hematuria  Ext:. No deformities,  Neuro: no unilateral weakness, numbness, or tingling, no vision change or hearing loss Skin: No rashes, lesions or wounds. MSK: No muscle spasm, no deformity, no limitation of range of movement in spin Heme: No easy bruising.  Travel history: No recent long distant travel.   Past Medical History:  Diagnosis Date  . CHF (congestive heart failure) ([place])   . Chronic diastolic heart failure (HCC)    a. in  the setting of a-fib --> Nonobstructive cath 09/06/2013 30% LAD.   2-D ECHO: Normal LV size and thickness. Mild LA dilatation. EF ~65%, no regional WMA  . CKD stage 3 due to type 2 diabetes mellitus (HCC)    Cr ~1.9  . Degenerative cervical disc   . Degenerative disc disease   . Essential hypertension   . PAF (paroxysmal atrial fibrillation) ([place])   . Type 2 diabetes mellitus with hypercholesterolemia [facility])     Past Surgical History:  Procedure Laterality Date  . ANTERIOR CERVICAL DECOMP/DISCECTOMY FUSION  12/17/2011   Procedure: ANTERIOR CERVICAL DECOMPRESSION/DISCECTOMY FUSION 1 LEVEL;  Surgeon: Erline Levine, MD;  Location: [place] NEURO ORS;  Service: Neurosurgery;  Laterality: N/A;  Exploration of Fusion and Anterior Cervical Six-Seven Decompression/Diskectomy/Fusion  . BOWEL RESECTION    . CARDIAC CATHETERIZATION  09/06/2013   30% LAD. Otherwise nonobstructive CAD  . CHOLECYSTECTOMY    . HERNIA REPAIR     YEARS AGO  . LEFT HEART CATHETERIZATION WITH CORONARY ANGIOGRAM N/A 09/06/2013   Procedure: LEFT HEART CATHETERIZATION WITH CORONARY ANGIOGRAM;  Surgeon: Leonie Man, MD;  Location: [facility] CATH LAB;  Service: Cardiovascular;  Laterality: N/A;  . NECK SURGERY    . POSTERIOR CERVICAL FUSION/FORAMINOTOMY  12/17/2011   Procedure: POSTERIOR CERVICAL FUSION/FORAMINOTOMY LEVEL 4;  Surgeon: Erline Levine, MD;  Location: [facility] NEURO ORS;  Service: Neurosurgery;;  Posterior Cervical Three-Seven Fusion  . TONSILLECTOMY       reports that he has  never smoked. He has never used smokeless tobacco. He reports that he does not drink alcohol or use drugs.  Allergies  Allergen Reactions  . Penicillins Hives, Itching and Other (See Comments)    Has patient had a PCN reaction causing immediate rash, facial/tongue/throat swelling, SOB or lightheadedness with hypotension: No Has patient had a PCN reaction causing severe rash involving mucus membranes or skin necrosis: No Has patient had a PCN reaction that  required hospitalization No Has patient had a PCN reaction occurring within the last 10 years: No If all of the above answers are "NO", then may proceed with Cephalosporin use.    Family History  Problem Relation Age of Onset  . Heart failure Mother   . Diabetes Mellitus II Mother   . Diabetes Mellitus II Sister    Family history reviewed and not pertinent   Prior to Admission medications   Medication Sig Start Date End Date Taking? Authorizing Provider  acetaminophen (TYLENOL) 500 MG tablet Take 1,000 mg by mouth every 6 (six) hours as needed for mild pain, moderate pain or headache.   Yes Historical Provider, MD  apixaban (ELIQUIS) 5 MG TABS tablet Take 1 tablet (5 mg total) by mouth 2 (two) times daily. 10/01/13  Yes Imogene Burn, PA-C  carvedilol (COREG) 25 MG tablet Take 25 mg by mouth 2 (two) times daily with a meal.   Yes Historical Provider, MD  glimepiride (AMARYL) 4 MG tablet Take 4 mg by mouth 2 (two) times daily.   Yes Historical Provider, MD  lisinopril (PRINIVIL,ZESTRIL) 40 MG tablet Take 40 mg by mouth daily.   Yes Historical Provider, MD  pregabalin (LYRICA) 100 MG capsule Take 100 mg by mouth 2 (two) times daily as needed (for pain.).    Yes Historical Provider, MD  Vitamin D, Ergocalciferol, (DRISDOL) 50000 UNITS CAPS capsule Take 50,000 Units by mouth every Saturday.    Yes Historical Provider, MD    Physical Exam: Vitals:   03/18/16 0528 03/18/16 0800 03/18/16 0830 03/18/16 0859  BP:  151/79 (!) 106/40 142/83  Pulse:  85 89 88  Resp: 18 22 20 22   Temp: 97.7 F (36.5 C)   98.1 F (36.7 C)  TempSrc: Oral   Oral  SpO2:  98% 99% 100%     Constitutional: NAD, calm, comfortable Eyes: PERRL, lids and conjunctivae normal ENMT: Mucous membranes are dry. Posterior pharynx clear.  Neck: normal, supple, no masses, no thyromegaly Respiratory: clear to auscultation bilaterally, no wheezing, no crackles. Normal respiratory effort Cardiovascular: Regular rate and  rhythm, no murmurs / rubs / gallops. No extremity edema. 2+ pedal pulses.  Abdomen: Soft mild epigastric tenderness, no masses palpated. No hepatosplenomegaly. Bowel sounds positive.  Musculoskeletal: no clubbing / cyanosis. No joint deformity upper and lower extremities. Good ROM, Skin: Dry. No rashes, lesions, ulcers. No induration Neurologic: CN 2-12 grossly intact. Sensation intact Strength 5/5 in all 4.  Psychiatric: Normal judgment and insight. Alert and oriented x 4.    Labs on Admission: I have personally reviewed following labs and imaging studies  CBC:  Recent Labs Lab 03/18/16 0550  WBC 6.8  HGB 14.4  HCT 40.5  MCV 74.0*  PLT 027   Basic Metabolic Panel:  Recent Labs Lab 03/18/16 0550  NA 135  K 4.7  CL 107  CO2 17*  GLUCOSE 36*  BUN 79*  CREATININE 6.35*  CALCIUM 8.4*   GFR: CrCl cannot be calculated (Unknown ideal weight.). Liver Function Tests:  Recent Labs Lab  03/18/16 0550  AST 22  ALT 24  ALKPHOS 75  BILITOT 0.5  PROT 8.0  ALBUMIN 3.9    Recent Labs Lab 03/18/16 0550  LIPASE 25   No results for input(s): AMMONIA in the last 168 hours. Coagulation Profile: No results for input(s): INR, PROTIME in the last 168 hours. Cardiac Enzymes:  Recent Labs Lab 03/18/16 0550  TROPONINI <0.03   BNP (last 3 results) No results for input(s): PROBNP in the last 8760 hours. HbA1C: No results for input(s): HGBA1C in the last 72 hours. CBG:  Recent Labs Lab 03/18/16 0611 03/18/16 0640 03/18/16 0736 03/18/16 0840 03/18/16 0900  GLUCAP 161* 146* 94 79 77   Lipid Profile: No results for input(s): CHOL, HDL, LDLCALC, TRIG, CHOLHDL, LDLDIRECT in the last 72 hours. Thyroid Function Tests: No results for input(s): TSH, T4TOTAL, FREET4, T3FREE, THYROIDAB in the last 72 hours. Anemia Panel: No results for input(s): VITAMINB12, FOLATE, FERRITIN, TIBC, IRON, RETICCTPCT in the last 72 hours. Urine analysis:    Component Value Date/Time    COLORURINE YELLOW 03/18/2016 0550   APPEARANCEUR HAZY (A) 03/18/2016 0550   LABSPEC 1.013 03/18/2016 0550   PHURINE 5.0 03/18/2016 0550   GLUCOSEU NEGATIVE 03/18/2016 0550   HGBUR SMALL (A) 03/18/2016 0550   BILIRUBINUR NEGATIVE 03/18/2016 0550   KETONESUR NEGATIVE 03/18/2016 0550   PROTEINUR 100 (A) 03/18/2016 0550   UROBILINOGEN 0.2 09/04/2013 1855   NITRITE NEGATIVE 03/18/2016 0550   LEUKOCYTESUR NEGATIVE 03/18/2016 0550    Radiological Exams on Admission: Dg Chest 2 View  Result Date: 03/18/2016 CLINICAL DATA:  Recent onset of cough, shortness of breath, chest congestion, wheezing, and fever. History of diabetes, CHF, nonsmoker. EXAM: CHEST  2 VIEW COMPARISON:  Chest x-ray of July 24, 2014. FINDINGS: The lungs are adequately inflated and clear. The heart and pulmonary vascularity are normal. The mediastinum is normal in width. There is no pleural effusion. The trachea midline. The bony thorax exhibits no acute abnormality. IMPRESSION: There is no pneumonia nor other acute cardiopulmonary abnormality. Electronically Signed   By: David  Martinique M.D.   On: 03/18/2016 07:25    EKG: Independently reviewed - NSR @ 93 bpm, Repo in the anterolateral leads. No ST segment changes   Assessment/Plan Acute on CKD stage III - likely 2/2 dehydration from viral gastroenteritis and continuation of ACE inhibitors. Last Cr on record was 1.8 in 2016.   Admit to tele  Will start on IVF  Get urine lytes Renal US ordered  Nephrology consult  Repeat BMP now  Trend Cr  Avoid nephrotoxins - holding lisinopril   Gastroenteritis - likely to be viral - now improving  Will treat conservatively for now  If diarrhea recur or persist will send C diff  Gi cocktail  Pepcid   Hypoglycemia - Initial CBG 33, in view of poor oral intake and continuation of glipizide - AKI poor excretion causing hypoglycemia.  Improved with juice and saltines  Monitor CBG's  PAF - Rate and rhythm controlled Patient on Eliquis  at home - will hold given high Cr  Start heparin drip per pharmacy  Resume Coreg  Monitor in Tele  Chronic diastolic heart failure - in setting of Afib and CAD - no signs of fluid overload Continue Coreg  Holding lisinopril  Check I&O's given patient on IVF  HTN - stable  See above   DM type 2 - last A1C per patient 7 - now hypoglycemic  Holding Glipizide  Monitor CBG  SSI   DVT  prophylaxis: Heparin drip  Code Status: FULL  Family Communication: None at bedside - attempted to call spouse x 2 no answer Disposition Plan: Anticipate discharge to previous home environment.  Consults called: Dr Jonnie Finner (nephrology) Admission status: Inpatient Tele   Chipper Oman MD [facility] Pager: Text Page via [email or web address]  [phone]  If 7PM-7AM, please contact night-coverage [email or web address] Password [email or web address]  03/18/2016, 9:10 AM

## 2016-03-18 NOTE — Progress Notes (Signed)
Hypoglycemic Event  CBG: 49  Treatment: D50 IV 25 mL  Symptoms: None  Follow-up CBG: Time:1824 CBG Result:139  Possible Reasons for Event: Unknown  Comments/MD notified:Dr. Shirline Frees, Larence Penning

## 2016-03-18 NOTE — ED Triage Notes (Addendum)
Pt has had stomach virus for past 3 days that is resolving; family states pt has been "talking out his head" and not acting like himself beginning last night; pt states he hasn't eaten in 3 days; CBG 40 on arrival; orange juice provided to pt which he drank; crackers provided as well however pt stated he did not want to eat them; education about hypoglycemia provided

## 2016-03-19 DIAGNOSIS — N183 Chronic kidney disease, stage 3 (moderate): Secondary | ICD-10-CM

## 2016-03-19 DIAGNOSIS — I480 Paroxysmal atrial fibrillation: Secondary | ICD-10-CM

## 2016-03-19 DIAGNOSIS — K529 Noninfective gastroenteritis and colitis, unspecified: Secondary | ICD-10-CM

## 2016-03-19 DIAGNOSIS — N179 Acute kidney failure, unspecified: Principal | ICD-10-CM

## 2016-03-19 LAB — GLUCOSE, CAPILLARY
Glucose-Capillary: 102 mg/dL — ABNORMAL HIGH (ref 65–99)
Glucose-Capillary: 102 mg/dL — ABNORMAL HIGH (ref 65–99)
Glucose-Capillary: 104 mg/dL — ABNORMAL HIGH (ref 65–99)
Glucose-Capillary: 106 mg/dL — ABNORMAL HIGH (ref 65–99)
Glucose-Capillary: 106 mg/dL — ABNORMAL HIGH (ref 65–99)
Glucose-Capillary: 126 mg/dL — ABNORMAL HIGH (ref 65–99)
Glucose-Capillary: 145 mg/dL — ABNORMAL HIGH (ref 65–99)
Glucose-Capillary: 146 mg/dL — ABNORMAL HIGH (ref 65–99)
Glucose-Capillary: 146 mg/dL — ABNORMAL HIGH (ref 65–99)
Glucose-Capillary: 168 mg/dL — ABNORMAL HIGH (ref 65–99)
Glucose-Capillary: 92 mg/dL (ref 65–99)
Glucose-Capillary: 93 mg/dL (ref 65–99)
Glucose-Capillary: 95 mg/dL (ref 65–99)
Glucose-Capillary: 97 mg/dL (ref 65–99)

## 2016-03-19 LAB — HEPARIN LEVEL (UNFRACTIONATED)
Heparin Unfractionated: 1.08 IU/mL — ABNORMAL HIGH (ref 0.30–0.70)
Heparin Unfractionated: 0.58 IU/mL (ref 0.30–0.70)

## 2016-03-19 LAB — BASIC METABOLIC PANEL
Anion gap: 6 (ref 5–15)
BUN: 70 mg/dL — ABNORMAL HIGH (ref 6–20)
CO2: 14 mmol/L — ABNORMAL LOW (ref 22–32)
Calcium: 7.8 mg/dL — ABNORMAL LOW (ref 8.9–10.3)
Chloride: 115 mmol/L — ABNORMAL HIGH (ref 101–111)
Creatinine, Ser: 5.13 mg/dL — ABNORMAL HIGH (ref 0.61–1.24)
GFR calc Af Amer: 13 mL/min — ABNORMAL LOW (ref >60)
GFR calc non Af Amer: 11 mL/min — ABNORMAL LOW (ref >60)
Glucose, Bld: 97 mg/dL (ref 65–99)
Potassium: 5.1 mmol/L (ref 3.5–5.1)
Sodium: 135 mmol/L (ref 135–145)

## 2016-03-19 LAB — CBC
HCT: 31.7 % — ABNORMAL LOW (ref 39.0–52.0)
Hemoglobin: 11.3 g/dL — ABNORMAL LOW (ref 13.0–17.0)
MCH: 26.2 pg (ref 26.0–34.0)
MCHC: 35.6 g/dL (ref 30.0–36.0)
MCV: 73.5 fL — ABNORMAL LOW (ref 78.0–100.0)
Platelets: 120 10*3/uL — ABNORMAL LOW (ref 150–400)
RBC: 4.31 MIL/uL (ref 4.22–5.81)
RDW: 13.9 % (ref 11.5–15.5)
WBC: 4.3 10*3/uL (ref 4.0–10.5)

## 2016-03-19 LAB — APTT
aPTT: 153 seconds — ABNORMAL HIGH (ref 24–36)
aPTT: 82 seconds — ABNORMAL HIGH (ref 24–36)

## 2016-03-19 MED ORDER — SODIUM BICARBONATE 650 MG PO TABS
650.0000 mg | ORAL_TABLET | Freq: Two times a day (BID) | ORAL | Status: DC
  Administered 2016-03-19 – 2016-03-20 (×3): 650 mg via ORAL
  Filled 2016-03-19 (×3): qty 1

## 2016-03-19 MED ORDER — HEPARIN (PORCINE) IN NACL 100-0.45 UNIT/ML-% IJ SOLN: 850.0000 [IU]/h | INTRAMUSCULAR | Status: DC

## 2016-03-19 MED ORDER — FAMOTIDINE IN NACL 20-0.9 MG/50ML-% IV SOLN
20.0000 mg | INTRAVENOUS | Status: DC
  Administered 2016-03-20: 20 mg via INTRAVENOUS
  Filled 2016-03-19: qty 50

## 2016-03-19 NOTE — Progress Notes (Signed)
Pharmacy - IV heparin  Assessment:    Please see note from Doreene Eland, PharmD earlier today for full details.  Briefly, 61 y.o. male on IV heparin for Afib (bridging while off Eliquis d/t AKI)   Most recent heparin level therapeutic at 0.58 on 850 units/hr  APTT appears to be correlating with heparin levels at this time (Eliquis effect worn off)  Plan:   Continue heparin at 850 units/hr  Recheck confirmatory heparin level with AM labs  Can discontinue checking aPTTs going forward  Reuel Boom, PharmD, BCPS Pager: [phone] 03/19/2016, 8:13 PM

## 2016-03-19 NOTE — Progress Notes (Signed)
Barry Lopez for heparin Indication: afib (Holding apixaban for AKI)  Allergies  Allergen Reactions  . Penicillins Hives, Itching and Other (See Comments)    Has patient had a PCN reaction causing immediate rash, facial/tongue/throat swelling, SOB or lightheadedness with hypotension: No Has patient had a PCN reaction causing severe rash involving mucus membranes or skin necrosis: No Has patient had a PCN reaction that required hospitalization No Has patient had a PCN reaction occurring within the last 10 years: No If all of the above answers are "NO", then may proceed with Cephalosporin use.    Patient Measurements: Height: 5\' 7"  (170.2 cm) Weight: 186 lb 11.7 oz (84.7 kg) IBW/kg (Calculated) : 66.1 Heparin Dosing Weight: 83kg  Vital Signs: Temp: 97.8 F (36.6 C) (02/09 0444) Temp Source: Oral (02/09 0444) BP: 125/68 (02/09 0444) Pulse Rate: 68 (02/09 0444)  Labs:  Recent Labs  03/18/16 0550 03/18/16 0941 03/18/16 2002 03/19/16 0610  HGB 14.4  --   --  11.3*  HCT 40.5  --   --  31.7*  PLT 154  --   --  120*  APTT  --  32 107* 153*  HEPARINUNFRC  --  1.86* 1.08* 1.08*  CREATININE 6.35* 5.38*  --  5.13*  TROPONINI <0.03  --   --   --     Estimated Creatinine Clearance: 15.9 mL/min (by C-G formula based on SCr of 5.13 mg/dL (H)).   Medical History: Past Medical History:  Diagnosis Date  . CHF (congestive heart failure) ([place])   . Chronic diastolic heart failure (HCC)    a. in the setting of a-fib --> Nonobstructive cath 09/06/2013 30% LAD.   2-D ECHO: Normal LV size and thickness. Mild Barry dilatation. EF ~65%, no regional WMA  . CKD stage 3 due to type 2 diabetes mellitus (HCC)    Cr ~1.9  . Degenerative cervical disc   . Degenerative disc disease   . Essential hypertension   . PAF (paroxysmal atrial fibrillation) ([place])   . Type 2 diabetes mellitus with hypercholesterolemia ([place])     Assessment: [name] presents with  hypoglycemia.  On admission, Scr 6.35 at admission (baseline = 2). Has h/o afib on apixaban at home.  Due to AKI and avoid apixaban, start heparin until renal function improved.   - Last apixaban dose 2/7 evening - Baseline (2/8a) aPTT = 32sec,  Heparin level: 1.86   Today, 03/19/2016   APTT elevated at 153 sec (heparin level also elevated, difficult to determine in two levels correlating vs. Lingering effects of apixaban) on heparin 1100 units/hr  CBC: Hgb dec and pltc decreased with hydration  Renal: AKI on CKD - SCr improving  No bleeding noted w/ recent vomiting and diarrhea   Goal of Therapy:  Heparin level 0.3-0.7 units/ml aPTT 66-102 seconds Monitor platelets by anticoagulation protocol: Yes   Plan:   Hold heparin for 1h then reduce heparin rate to 850 units/hr  Check 8h aPTT/heparin level  Daily heparin level and aPTT until they begin to correlate and apixaban eliminated  Daily CBC  Monitor for improvement in SCr and possibility of resuming apixaban  Doreene Eland, PharmD, BCPS.   Pager: [phone] 03/19/2016 8:23 AM

## 2016-03-19 NOTE — Progress Notes (Signed)
  [facility] Progress Note   Subjective: no c/o, creat down to 5.1 today. Not eating much.   Vitals:   03/18/16 1503 03/18/16 2100 03/19/16 0444 03/19/16 1348  BP: (!) 120/54 (!) 112/58 125/68 134/71  Pulse: 80 74 68 71  Resp: 16 16 20 18   Temp: 98.2 F (36.8 C) 98.2 F (36.8 C) 97.8 F (36.6 C) 97.4 F (36.3 C)  TempSrc: Oral Oral Oral Oral  SpO2: 100% 100% 100% 100%  Weight:      Height:        Inpatient medications: . carvedilol  25 mg Oral BID WC  . [START ON 03/20/2016] famotidine (PEPCID) IV  20 mg Intravenous Q24H  . sodium bicarbonate  650 mg Oral BID   . dextrose 5 % and 0.45% NaCl 100 mL/hr at 03/19/16 0646  . heparin 850 Units/hr (03/19/16 0930)   ondansetron **OR** ondansetron (ZOFRAN) IV, oxyCODONE, traMADol  Exam: Gen alert, WDWN No jvd or bruits Chest clear bilat RRR no MRG Abd soft ntnd no mass or ascites +bs Ext no LE edema Neuro is alert, Ox 3 , nf  UA 6-30 rbc, 0-5 wbc, no epi, prot 100 Renal US > 11-12 cm kidneys with Edison Pace, no hydro  Assessment: 1. Acute on CKD - last creat from [place] was 2.7 in Aug 2017.  Would cont IVF"s. Pt w dehydrating illnes with + ACEi, prob cause for AKI.  Continue IVF"s, cont to hold lisinopril and keep BP's up.  DC'd coreg.  2. CKD - due to DM 3. DM2 4. HTN 5. Hx neck surgery  6. Afib - on coreg and eliquis at home, IV hep here. DC"d coreg for now (in NSR).  Should be ok to use Eliquis in CKD stage 4.    Plan - as above   Kelly Splinter MD [facility] pager [phone]   03/19/2016, 2:34 PM    Recent Labs Lab 03/18/16 0550 03/18/16 0941 03/19/16 0610  NA 135 135 135  K 4.7 4.9 5.1  CL 107 113* 115*  CO2 17* 15* 14*  GLUCOSE 36* 66 97  BUN 79* 76* 70*  CREATININE 6.35* 5.38* 5.13*  CALCIUM 8.4* 7.9* 7.8*  PHOS  --  3.9  --     Recent Labs Lab 03/18/16 0550  AST 22  ALT 24  ALKPHOS 75  BILITOT 0.5  PROT 8.0  ALBUMIN 3.9    Recent Labs Lab 03/18/16 0550  03/19/16 0610  WBC 6.8 4.3  HGB 14.4 11.3*  HCT 40.5 31.7*  MCV 74.0* 73.5*  PLT 154 120*   Iron/TIBC/Ferritin/ %Sat No results found for: IRON, TIBC, FERRITIN, IRONPCTSAT

## 2016-03-19 NOTE — Progress Notes (Signed)
PROGRESS NOTE  ALICE VITELLI  ERD:[id number] DOB: [birth year] DOA: 03/18/2016 PCP: Philis Fendt, MD  Outpatient Specialists: [facility] Dr. Ellyn Hack, cardiology  Brief Narrative: [name] is a 61 y.o. male with PMHx of PAF, CKD stage III, DM type II and HTN was brought to the ED due to confusion and low blood sugar. Apparently patient was delirious and check his blood sugar at home which was 77. Wife decided to bring him to the ED because of confusion state where his CBG was 33. Creatinine was 6.35. He had been taking an ACE inhibitor.   Patient reported that he has been dealing with persistent vomiting and diarrhea that started 3 days prior to admission. Associated symptoms are subjective fever, diffuse abdominal pain and anorexia. Even that patient has been with poor oral intake for the past 3 days he has continued to take his hypoglycemic medications. Patient denies bloody stools or emesis. He has not taken any medications for this and nothing has make it better. Denies recent travel outside the [place]. Does not recall eating street food.  Assessment & Plan: Active Problems:   CKD (chronic kidney disease), stage III   PAF (paroxysmal atrial fibrillation) (HCC)   Acute renal failure (ARF) (HCC)   Gastroenteritis  Acute renal failure on CKD stage III - likely 2/2 dehydration from viral gastroenteritis and continuation of ACE inhibitors. Last Cr on record was 1.8 in 2016, [place] records obtained and show Cr 2.7 in Aug 2017. Renal U/S shows medical renal disease without hydro.  - Continue IVF's as po remains poor. Creatinine improving but not very near baseline. - Hold ACE inhibitor - Trend BMP.  - Appreciate nephrology assistance.   Gastroenteritis - likely to be viral - now improving but still poor po. - Supportive treatment, encouraging po.  Hypoglycemia: Initial CBG 33 likely due to poor po with N/V/D and build up of glipizide with acute renal failure. - Space out  CBG checks to q4h as CBGs are improving. Will transition to AC/HS once taking po reliably.   PAF: Rate and rhythm controlled.  - Holding coreg to maintain adequate renal perfusion  - Holding eliquis for now and on heparin gtt. Discussed with nephrology. Plan to restart eliquis at renal dosing once CrCl >75ml/min.   Chronic diastolic heart failure - in setting of Afib and CAD. Suspect he's hypovolemic.  - Holding ACE and beta blocker for now.  - Monitor I/O  HTN - stable  See above   DM type 2 - last A1C per patient 7 - now hypoglycemic  Holding Glipizide  Monitor CBG  SSI   DVT prophylaxis: Heparin gtt Code Status: Full Family Communication: Wife at bedside Disposition Plan: Anticipate DC to home if taking good po in AM.  Consultants:   Nephrology, Dr. Jonnie Finner  Procedures:   None  Antimicrobials:  None   Subjective: Pt feels tired with significant nausea despite no emesis. No diarrhea since arrival. No fevers.   Objective: Vitals:   03/18/16 1503 03/18/16 2100 03/19/16 0444 03/19/16 1348  BP: (!) 120/54 (!) 112/58 125/68 134/71  Pulse: 80 74 68 71  Resp: 16 16 20 18   Temp: 98.2 F (36.8 C) 98.2 F (36.8 C) 97.8 F (36.6 C) 97.4 F (36.3 C)  TempSrc: Oral Oral Oral Oral  SpO2: 100% 100% 100% 100%  Weight:      Height:        Intake/Output Summary (Last 24 hours) at 03/19/16 1442 Last data filed at  03/19/16 0600  Gross per 24 hour  Intake          2026.46 ml  Output                0 ml  Net          2026.46 ml   Filed Weights   03/18/16 1000  Weight: 84.7 kg (186 lb 11.7 oz)    Examination: General exam: Tired-appearing 61yo male in no distress Respiratory system: Non-labored breathing room air. Clear to auscultation bilaterally.  Cardiovascular system: Regular rate and rhythm. No murmur, rub, or gallop. No JVD, and no pedal edema. Gastrointestinal system: Abdomen soft, non-tender, non-distended, with normoactive bowel sounds. No organomegaly  or masses felt. Central nervous system: Alert and oriented. No focal neurological deficits. Extremities: Warm, no deformities Skin: No rashes, lesions no ulcers Psychiatry: Judgement and insight appear normal. Mood & affect appropriate.   Data Reviewed: I have personally reviewed following labs and imaging studies  CBC:  Recent Labs Lab 03/18/16 0550 03/19/16 0610  WBC 6.8 4.3  HGB 14.4 11.3*  HCT 40.5 31.7*  MCV 74.0* 73.5*  PLT 154 937*   Basic Metabolic Panel:  Recent Labs Lab 03/18/16 0550 03/18/16 0941 03/19/16 0610  NA 135 135 135  K 4.7 4.9 5.1  CL 107 113* 115*  CO2 17* 15* 14*  GLUCOSE 36* 66 97  BUN 79* 76* 70*  CREATININE 6.35* 5.38* 5.13*  CALCIUM 8.4* 7.9* 7.8*  MG  --  1.8  --   PHOS  --  3.9  --    GFR: Estimated Creatinine Clearance: 15.9 mL/min (by C-G formula based on SCr of 5.13 mg/dL (H)). Liver Function Tests:  Recent Labs Lab 03/18/16 0550  AST 22  ALT 24  ALKPHOS 75  BILITOT 0.5  PROT 8.0  ALBUMIN 3.9    Recent Labs Lab 03/18/16 0550  LIPASE 25   No results for input(s): AMMONIA in the last 168 hours. Coagulation Profile: No results for input(s): INR, PROTIME in the last 168 hours. Cardiac Enzymes:  Recent Labs Lab 03/18/16 0550  TROPONINI <0.03   BNP (last 3 results) No results for input(s): PROBNP in the last 8760 hours. HbA1C: No results for input(s): HGBA1C in the last 72 hours. CBG:  Recent Labs Lab 03/19/16 0746 03/19/16 0858 03/19/16 1003 03/19/16 1158 03/19/16 1346  GLUCAP 106* 106* 146* 168* 145*   Lipid Profile: No results for input(s): CHOL, HDL, LDLCALC, TRIG, CHOLHDL, LDLDIRECT in the last 72 hours. Thyroid Function Tests: No results for input(s): TSH, T4TOTAL, FREET4, T3FREE, THYROIDAB in the last 72 hours. Anemia Panel: No results for input(s): VITAMINB12, FOLATE, FERRITIN, TIBC, IRON, RETICCTPCT in the last 72 hours. Urine analysis:    Component Value Date/Time   COLORURINE YELLOW  03/18/2016 0550   APPEARANCEUR HAZY (A) 03/18/2016 0550   LABSPEC 1.013 03/18/2016 0550   PHURINE 5.0 03/18/2016 0550   GLUCOSEU NEGATIVE 03/18/2016 0550   HGBUR SMALL (A) 03/18/2016 0550   BILIRUBINUR NEGATIVE 03/18/2016 0550   KETONESUR NEGATIVE 03/18/2016 0550   PROTEINUR 100 (A) 03/18/2016 0550   UROBILINOGEN 0.2 09/04/2013 1855   NITRITE NEGATIVE 03/18/2016 0550   LEUKOCYTESUR NEGATIVE 03/18/2016 0550   No results found for this or any previous visit (from the past 240 hour(s)).    Radiology Studies: Dg Chest 2 View  Result Date: 03/18/2016 CLINICAL DATA:  Recent onset of cough, shortness of breath, chest congestion, wheezing, and fever. History of diabetes, CHF, nonsmoker. EXAM: CHEST  2 VIEW COMPARISON:  Chest x-ray of July 24, 2014. FINDINGS: The lungs are adequately inflated and clear. The heart and pulmonary vascularity are normal. The mediastinum is normal in width. There is no pleural effusion. The trachea midline. The bony thorax exhibits no acute abnormality. IMPRESSION: There is no pneumonia nor other acute cardiopulmonary abnormality. Electronically Signed   By: David  Martinique M.D.   On: 03/18/2016 07:25   US Renal  Result Date: 03/18/2016 CLINICAL DATA:  Acute renal insufficiency, hypertension, diabetes EXAM: RENAL / URINARY TRACT ULTRASOUND COMPLETE COMPARISON:  Ultrasound of the abdomen of 09/05/2013 FINDINGS: Right Kidney: Length: 10.9 cm compared prior measurement of 11.6 cm in 2015. The echogenicity of the parenchyma is increased consistent with chronic renal medical disease. No hydronephrosis is seen. Left Kidney: Length: 11.5 cm compared to 12.7 cm previously. No hydronephrosis is noted. The echogenicity is diffusely increased. Bladder: The urinary bladder is not well distended but is grossly unremarkable. IMPRESSION: 1. No hydronephrosis. 2. Diffusely echogenic renal parenchyma bilaterally consistent with chronic renal medical disease. Electronically Signed   By: Ivar Drape M.D.   On: 03/18/2016 09:53    Scheduled Meds: . [START ON 03/20/2016] famotidine (PEPCID) IV  20 mg Intravenous Q24H  . sodium bicarbonate  650 mg Oral BID   Continuous Infusions: . dextrose 5 % and 0.45% NaCl 100 mL/hr at 03/19/16 0646  . heparin 850 Units/hr (03/19/16 0930)     LOS: 1 day   Time spent: 25 minutes.  Vance Gather, MD [facility] Pager [phone]  If 7PM-7AM, please contact night-coverage [email or web address] Password [email or web address] 03/19/2016, 2:42 PM

## 2016-03-20 DIAGNOSIS — N189 Chronic kidney disease, unspecified: Secondary | ICD-10-CM

## 2016-03-20 DIAGNOSIS — N179 Acute kidney failure, unspecified: Secondary | ICD-10-CM

## 2016-03-20 DIAGNOSIS — E860 Dehydration: Secondary | ICD-10-CM

## 2016-03-20 DIAGNOSIS — E1169 Type 2 diabetes mellitus with other specified complication: Secondary | ICD-10-CM

## 2016-03-20 DIAGNOSIS — E7800 Pure hypercholesterolemia, unspecified: Secondary | ICD-10-CM

## 2016-03-20 DIAGNOSIS — E162 Hypoglycemia, unspecified: Secondary | ICD-10-CM

## 2016-03-20 LAB — BASIC METABOLIC PANEL
Anion gap: 5 (ref 5–15)
BUN: 56 mg/dL — ABNORMAL HIGH (ref 6–20)
CO2: 17 mmol/L — ABNORMAL LOW (ref 22–32)
Calcium: 8.1 mg/dL — ABNORMAL LOW (ref 8.9–10.3)
Chloride: 116 mmol/L — ABNORMAL HIGH (ref 101–111)
Creatinine, Ser: 4.51 mg/dL — ABNORMAL HIGH (ref 0.61–1.24)
GFR calc Af Amer: 15 mL/min — ABNORMAL LOW (ref >60)
GFR calc non Af Amer: 13 mL/min — ABNORMAL LOW (ref >60)
Glucose, Bld: 114 mg/dL — ABNORMAL HIGH (ref 65–99)
Potassium: 5 mmol/L (ref 3.5–5.1)
Sodium: 138 mmol/L (ref 135–145)

## 2016-03-20 LAB — CBC
HCT: 30.8 % — ABNORMAL LOW (ref 39.0–52.0)
Hemoglobin: 11 g/dL — ABNORMAL LOW (ref 13.0–17.0)
MCH: 26.2 pg (ref 26.0–34.0)
MCHC: 35.7 g/dL (ref 30.0–36.0)
MCV: 73.3 fL — ABNORMAL LOW (ref 78.0–100.0)
Platelets: 133 10*3/uL — ABNORMAL LOW (ref 150–400)
RBC: 4.2 MIL/uL — ABNORMAL LOW (ref 4.22–5.81)
RDW: 13.8 % (ref 11.5–15.5)
WBC: 4.6 10*3/uL (ref 4.0–10.5)

## 2016-03-20 LAB — GLUCOSE, CAPILLARY
Glucose-Capillary: 108 mg/dL — ABNORMAL HIGH (ref 65–99)
Glucose-Capillary: 111 mg/dL — ABNORMAL HIGH (ref 65–99)
Glucose-Capillary: 118 mg/dL — ABNORMAL HIGH (ref 65–99)

## 2016-03-20 LAB — HEPARIN LEVEL (UNFRACTIONATED): Heparin Unfractionated: 0.5 IU/mL (ref 0.30–0.70)

## 2016-03-20 MED ORDER — ONDANSETRON HCL 4 MG PO TABS: 4.0000 mg | ORAL_TABLET | Freq: Four times a day (QID) | ORAL | 0 refills | Status: DC | PRN

## 2016-03-20 MED ORDER — APIXABAN 5 MG PO TABS
5.0000 mg | ORAL_TABLET | Freq: Two times a day (BID) | ORAL | Status: DC
  Administered 2016-03-20: 5 mg via ORAL
  Filled 2016-03-20: qty 1

## 2016-03-20 NOTE — Discharge Summary (Signed)
Physician Discharge Summary  Barry Lopez IEP:[id number] DOB: 23-Dec-1955 DOA: 03/18/2016  PCP: Philis Fendt, MD  Admit date: 03/18/2016 Discharge date: 03/20/2016  Admitted From: Home Disposition: Home   Recommendations for Outpatient Follow-up:  1. Follow up with nephrologist at the [facility] in [place] in the next week.  2. Please obtain BMP to monitor renal function. 3. Patient instructed to hold lisinopril and glimepiride until follow up.   Home Health: None Equipment/Devices: None Discharge Condition: Stable CODE STATUS: Full Diet recommendation: As tolerated, carbohydrate-modified  Brief/Interim Summary: Barry Banda Faisonis a 61 y.o.malewith PMHx of PAF, CKD stage III, DM type II and HTN was brought to the ED due to confusion and low blood sugar in the setting of 3 days of vomiting, diarrhea, and no per oral intake. Apparently patient was delirious and checked his blood sugar at home which was 77. Wife decided to bring him to the ED because of confusion where his CBG was 33.Creatinine was 6.35. He had been taking an ACE inhibitor and glipizide. He was admitted for acute renal failure in the setting of viral gastroenteritis.   CBG's were monitored and steadily improved after initial dextrose. Glipizide was held and no further interventions were required. GI symptoms of emesis and diarrhea resolved after admission, and per oral intake slowly improved. Nephrology was consulted and recommended holding ACE inhibitor, giving IV fluids, and monitoring creatinine. SCr improved steadily down to 4.51 (apparent baseline is 2.7). Because he is able to tolerate per oral intake and creatinine continues to improve, he is stable for discharge with follow up within the next 1 - 2 weeks with nephrology.   Discharge Diagnoses:  Active Problems:   CKD (chronic kidney disease), stage III   PAF (paroxysmal atrial fibrillation) (HCC)   Acute renal failure (ARF) (HCC)   Gastroenteritis  Acute renal  failure on CKD stage III - likely 2/2 dehydration from viral gastroenteritis and continuation of ACE inhibitos. [place] records obtained and show Cr 2.7 in Aug 2017. Renal U/S shows medical renal disease without hydro.  - Hold ACE inhibitor until follow up - BMP trend reassuring. Ok to DC per nephrology.  Gastroenteritis: Likely to be viral - now improving with resolution of emesis and diarrhea. - Supportive treatment, encouraging po.  Hypoglycemia (resolved): Initial CBG 33 likely due to poor po with N/V/D and build up of glipizide with acute renal failure. - No further hypoglycemia without intervention. Will continue to hold sulfonylurea until follow up as last A1c was 7 per patient.   PAF: Rate and rhythm controlled.  - Holding coreg for SBP < 136mmHg to maintain renal perfusion. - Per nephrology, restarted eliquis prior to discharge (put on heparin gtt due to decreased CrCl).   Chronic diastolic heart failure- in setting of Afib and CAD. Suspect he's hypovolemic.  - Holding ACE and beta blocker for now.  - Monitor I/O  HTN: Chronic, stable  - Medications as above  T2DM: Controlled, last A1C per patient 7. - As above, held [place] during admission and at discharge until follow up  Discharge Instructions Discharge Instructions    Diet Carb Modified    Complete by:  As directed    Discharge instructions    Complete by:  As directed    You were admitted with dehydration due to GI illness, most likely viral gastroenteritis, which has improved. This dehydration, in addition to lisinopril, caused a decrease in kidney function which has improved with rehydration and holding lisinopril. You also had low blood  sugar due to glimepiride which was not cleared by your kidneys as effectively as usual. This has improved. You are stable for discharge with the following recommendations:  - STOP taking lisinopril and glimepiride until you follow up with your nephrologist at the [facility]  if your systolic (the higher number) blood pressure drops below 145mmHg. - Continue taking other medications as you were.  - Zofran has been sent to your pharmacy to be taken as needed for nausea - Return for care if you develop vomiting and are unable to take anything by mouth.     Allergies as of 03/20/2016      Reactions   Penicillins Hives, Itching, Other (See Comments)   Has patient had a PCN reaction causing immediate rash, facial/tongue/throat swelling, SOB or lightheadedness with hypotension: No Has patient had a PCN reaction causing severe rash involving mucus membranes or skin necrosis: No Has patient had a PCN reaction that required hospitalization No Has patient had a PCN reaction occurring within the last 10 years: No If all of the above answers are "NO", then may proceed with Cephalosporin use.      Medication List    STOP taking these medications   glimepiride 4 MG tablet Commonly known as:  AMARYL   lisinopril 40 MG tablet Commonly known as:  PRINIVIL,ZESTRIL     TAKE these medications   acetaminophen 500 MG tablet Commonly known as:  TYLENOL Take 1,000 mg by mouth every 6 (six) hours as needed for mild pain, moderate pain or headache.   apixaban 5 MG Tabs tablet Commonly known as:  ELIQUIS Take 1 tablet (5 mg total) by mouth 2 (two) times daily.   carvedilol 25 MG tablet Commonly known as:  COREG Take 25 mg by mouth 2 (two) times daily with a meal.   pregabalin 100 MG capsule Commonly known as:  LYRICA Take 100 mg by mouth 2 (two) times daily as needed (for pain.).   Vitamin D (Ergocalciferol) 50000 units Caps capsule Commonly known as:  DRISDOL Take 50,000 Units by mouth every Saturday.      Follow-up Information    Philis Fendt, MD Follow up.   Specialty:  Internal Medicine Contact information: [address] [place] [address] [phone]        [place]. Schedule an appointment as soon as possible for a visit.           Allergies  Allergen Reactions  . Penicillins Hives, Itching and Other (See Comments)    Has patient had a PCN reaction causing immediate rash, facial/tongue/throat swelling, SOB or lightheadedness with hypotension: No Has patient had a PCN reaction causing severe rash involving mucus membranes or skin necrosis: No Has patient had a PCN reaction that required hospitalization No Has patient had a PCN reaction occurring within the last 10 years: No If all of the above answers are "NO", then may proceed with Cephalosporin use.   Consultations:  Nephrology, Dr. Jonnie Finner  Procedures/Studies: Dg Chest 2 View  Result Date: 03/18/2016 CLINICAL DATA:  Recent onset of cough, shortness of breath, chest congestion, wheezing, and fever. History of diabetes, CHF, nonsmoker. EXAM: CHEST  2 VIEW COMPARISON:  Chest x-ray of July 24, 2014. FINDINGS: The lungs are adequately inflated and clear. The heart and pulmonary vascularity are normal. The mediastinum is normal in width. There is no pleural effusion. The trachea midline. The bony thorax exhibits no acute abnormality. IMPRESSION: There is no pneumonia nor other acute cardiopulmonary abnormality.  Electronically Signed   By: David  Martinique M.D.   On: 03/18/2016 07:25   US Renal  Result Date: 03/18/2016 CLINICAL DATA:  Acute renal insufficiency, hypertension, diabetes EXAM: RENAL / URINARY TRACT ULTRASOUND COMPLETE COMPARISON:  Ultrasound of the abdomen of 09/05/2013 FINDINGS: Right Kidney: Length: 10.9 cm compared prior measurement of 11.6 cm in 2015. The echogenicity of the parenchyma is increased consistent with chronic renal medical disease. No hydronephrosis is seen. Left Kidney: Length: 11.5 cm compared to 12.7 cm previously. No hydronephrosis is noted. The echogenicity is diffusely increased. Bladder: The urinary bladder is not well distended but is grossly unremarkable. IMPRESSION: 1. No hydronephrosis. 2. Diffusely echogenic renal parenchyma bilaterally  consistent with chronic renal medical disease. Electronically Signed   By: Ivar Drape M.D.   On: 03/18/2016 09:53   Subjective: Pt still nauseated, improved with antiemetic (last taken yesterday morning, without any emesis in >24 hours. Eating well. Diarrhea has stopped.   Discharge Exam: Vitals:   03/19/16 2016 03/20/16 0424  BP: 137/70 130/82  Pulse: 68 74  Resp: 18 20  Temp: 98 F (36.7 C) 98 F (36.7 C)   General: Pt is alert, awake, not in acute distress Cardiovascular: RRR, S1/S2 +, no rubs, no gallops Respiratory: CTA bilaterally, no wheezing, no rhonchi Abdominal: Soft, NT, ND, bowel sounds + Extremities: No edema, no cyanosis  The results of significant diagnostics from this hospitalization (including imaging, microbiology, ancillary and laboratory) are listed below for reference.    Labs: BNP (last 3 results)  Recent Labs  03/18/16 0550  BNP 15.7   Basic Metabolic Panel:  Recent Labs Lab 03/18/16 0550 03/18/16 0941 03/19/16 0610 03/20/16 0534  NA 135 135 135 138  K 4.7 4.9 5.1 5.0  CL 107 113* 115* 116*  CO2 17* 15* 14* 17*  GLUCOSE 36* 66 97 114*  BUN 79* 76* 70* 56*  CREATININE 6.35* 5.38* 5.13* 4.51*  CALCIUM 8.4* 7.9* 7.8* 8.1*  MG  --  1.8  --   --   PHOS  --  3.9  --   --    Liver Function Tests:  Recent Labs Lab 03/18/16 0550  AST 22  ALT 24  ALKPHOS 75  BILITOT 0.5  PROT 8.0  ALBUMIN 3.9    Recent Labs Lab 03/18/16 0550  LIPASE 25   CBC:  Recent Labs Lab 03/18/16 0550 03/19/16 0610 03/20/16 0534  WBC 6.8 4.3 4.6  HGB 14.4 11.3* 11.0*  HCT 40.5 31.7* 30.8*  MCV 74.0* 73.5* 73.3*  PLT 154 120* 133*   Cardiac Enzymes:  Recent Labs Lab 03/18/16 0550  TROPONINI <0.03   CBG:  Recent Labs Lab 03/19/16 1620 03/19/16 2014 03/20/16 0021 03/20/16 0420 03/20/16 0737  GLUCAP 146* 126* 111* 108* 118*   Urinalysis    Component Value Date/Time   COLORURINE YELLOW 03/18/2016 0550   APPEARANCEUR HAZY (A)  03/18/2016 0550   LABSPEC 1.013 03/18/2016 0550   PHURINE 5.0 03/18/2016 0550   GLUCOSEU NEGATIVE 03/18/2016 0550   HGBUR SMALL (A) 03/18/2016 0550   BILIRUBINUR NEGATIVE 03/18/2016 0550   KETONESUR NEGATIVE 03/18/2016 0550   PROTEINUR 100 (A) 03/18/2016 0550   UROBILINOGEN 0.2 09/04/2013 1855   NITRITE NEGATIVE 03/18/2016 0550   LEUKOCYTESUR NEGATIVE 03/18/2016 0550   Time coordinating discharge: Approximately 40 minutes  Vance Gather, MD  [facility] 03/20/2016, 11:06 AM Pager [phone]  If 7PM-7AM, please contact night-coverage [email or web address] Password [email or web address]

## 2016-03-20 NOTE — Progress Notes (Signed)
  [facility] Progress Note   Subjective:   Vitals:   03/19/16 1500 03/19/16 2016 03/20/16 0424 03/20/16 0500  BP:  137/70 130/82   Pulse:  68 74   Resp:  18 20   Temp:  98 F (36.7 C) 98 F (36.7 C)   TempSrc:  Oral Oral   SpO2:  100% 100%   Weight: 85.2 kg (187 lb 12.8 oz)   84.2 kg (185 lb 9.6 oz)  Height:        Inpatient medications: . famotidine (PEPCID) IV  20 mg Intravenous Q24H  . sodium bicarbonate  650 mg Oral BID   . dextrose 5 % and 0.45% NaCl 100 mL/hr at 03/20/16 0305  . heparin 850 Units/hr (03/19/16 0930)   ondansetron **OR** ondansetron (ZOFRAN) IV, oxyCODONE, traMADol  Exam: Gen alert, WDWN No jvd or bruits Chest clear bilat RRR no MRG Abd soft ntnd no mass or ascites +bs Ext no LE edema Neuro is alert, Ox 3 , nf  UA 6-30 rbc, 0-5 wbc, no epi, prot 100 Renal US > 11-12 cm kidneys with Edison Pace, no hydro  Assessment: 1. Acute on CKD - last creat from [place] was 2.7 in Aug 2017.  Improving creat with holding ACEi and IVF"s.   OK for dc.  He should HOLD his lisinopril at dc and don't resume until seen by his nephrologist at the [place].  He knows to make an appt with labs in 1-2 wks with his kidney specialist at the [facility] to f/u his kidney function and BP.  Coreg and Vit D and Eliquis can be resumed at dc today.  I told him not to take the Coreg if his SBP is less than 110 or so.  Have d/w primary MD.  2. CKD - due to DM 3. DM2 4. HTN 5. Hx neck surgery   Plan - as above. Will sign off.     Kelly Splinter MD [facility] pager [phone]   03/20/2016, 8:36 AM    Recent Labs Lab 03/18/16 0941 03/19/16 0610 03/20/16 0534  NA 135 135 138  K 4.9 5.1 5.0  CL 113* 115* 116*  CO2 15* 14* 17*  GLUCOSE 66 97 114*  BUN 76* 70* 56*  CREATININE 5.38* 5.13* 4.51*  CALCIUM 7.9* 7.8* 8.1*  PHOS 3.9  --   --     Recent Labs Lab 03/18/16 0550  AST 22  ALT 24  ALKPHOS 75  BILITOT 0.5  PROT 8.0  ALBUMIN 3.9    Recent  Labs Lab 03/18/16 0550 03/19/16 0610 03/20/16 0534  WBC 6.8 4.3 4.6  HGB 14.4 11.3* 11.0*  HCT 40.5 31.7* 30.8*  MCV 74.0* 73.5* 73.3*  PLT 154 120* 133*   Iron/TIBC/Ferritin/ %Sat No results found for: IRON, TIBC, FERRITIN, IRONPCTSAT

## 2016-03-20 NOTE — Progress Notes (Signed)
Went over all discharge paperwork with patient and family.  All questions answered.  Pt refused wheelchair, requested to walk out to car.  VSS.

## 2016-03-20 NOTE — Progress Notes (Signed)
[place] for heparin Indication: afib (Holding apixaban for AKI)  Allergies  Allergen Reactions  . Penicillins Hives, Itching and Other (See Comments)    Has patient had a PCN reaction causing immediate rash, facial/tongue/throat swelling, SOB or lightheadedness with hypotension: No Has patient had a PCN reaction causing severe rash involving mucus membranes or skin necrosis: No Has patient had a PCN reaction that required hospitalization No Has patient had a PCN reaction occurring within the last 10 years: No If all of the above answers are "NO", then may proceed with Cephalosporin use.    Patient Measurements: Height: 5\' 7"  (170.2 cm) Weight: 185 lb 9.6 oz (84.2 kg) IBW/kg (Calculated) : 66.1 Heparin Dosing Weight: 83kg  Vital Signs: Temp: 98 F (36.7 C) (02/10 0424) Temp Source: Oral (02/10 0424) BP: 130/82 (02/10 0424) Pulse Rate: 74 (02/10 0424)  Labs:  Recent Labs  03/18/16 0550  03/18/16 0941 03/18/16 2002 03/19/16 0610 03/19/16 1730 03/20/16 0534  HGB 14.4  --   --   --  11.3*  --  11.0*  HCT 40.5  --   --   --  31.7*  --  30.8*  PLT 154  --   --   --  120*  --  133*  APTT  --   < > 32 107* 153* 82*  --   HEPARINUNFRC  --   < > 1.86* 1.08* 1.08* 0.58 0.50  CREATININE 6.35*  --  5.38*  --  5.13*  --  4.51*  TROPONINI <0.03  --   --   --   --   --   --   < > = values in this interval not displayed.  Estimated Creatinine Clearance: 18.1 mL/min (by C-G formula based on SCr of 4.51 mg/dL (H)).   Medical History: Past Medical History:  Diagnosis Date  . CHF (congestive heart failure) ([place])   . Chronic diastolic heart failure (HCC)    a. in the setting of a-fib --> Nonobstructive cath 09/06/2013 30% LAD.   2-D ECHO: Normal LV size and thickness. Mild LA dilatation. EF ~65%, no regional WMA  . CKD stage 3 due to type 2 diabetes mellitus (HCC)    Cr ~1.9  . Degenerative cervical disc   . Degenerative disc disease   .  Essential hypertension   . PAF (paroxysmal atrial fibrillation) ([place])   . Type 2 diabetes mellitus with hypercholesterolemia ([place])     Assessment: 68 YOM presents with hypoglycemia.  On admission, Scr 6.35 (baseline = 2). Has h/o afib on apixaban at home.  Due to AKI and avoid apixaban, start heparin until renal function improved.   - Last apixaban dose 2/7 evening - Baseline (2/8a) aPTT = 32sec,  Heparin level: 1.86   Today, 03/20/2016   Heparin level 0.5 on heparin at 850units/hr.  CBC: stable  Renal: AKI on CKD - SCr cont to improve.   No bleeding noted w/ recent vomiting and diarrhea. No bleeding per RN.   Goal of Therapy:  Heparin level 0.3-0.7 units/ml aPTT 66-102 seconds Monitor platelets by anticoagulation protocol: Yes   Plan:   Cont heparin infusion at 850units/hr  Daily heparin level and CBC  Monitor for improvement in SCr and possibility of resuming apixaban  Romeo Rabon, PharmD, pager [phone]. 03/20/2016,6:56 AM.

## 2016-03-22 DIAGNOSIS — K5289 Other specified noninfective gastroenteritis and colitis: Secondary | ICD-10-CM | POA: Diagnosis not present

## 2016-03-22 DIAGNOSIS — I10 Essential (primary) hypertension: Secondary | ICD-10-CM | POA: Diagnosis not present

## 2016-03-22 DIAGNOSIS — Z125 Encounter for screening for malignant neoplasm of prostate: Secondary | ICD-10-CM | POA: Diagnosis not present

## 2016-03-22 DIAGNOSIS — E1140 Type 2 diabetes mellitus with diabetic neuropathy, unspecified: Secondary | ICD-10-CM | POA: Diagnosis not present

## 2016-03-22 DIAGNOSIS — N182 Chronic kidney disease, stage 2 (mild): Secondary | ICD-10-CM | POA: Diagnosis not present

## 2016-04-13 DIAGNOSIS — H2513 Age-related nuclear cataract, bilateral: Secondary | ICD-10-CM | POA: Diagnosis not present

## 2016-04-13 DIAGNOSIS — E113292 Type 2 diabetes mellitus with mild nonproliferative diabetic retinopathy without macular edema, left eye: Secondary | ICD-10-CM | POA: Diagnosis not present

## 2016-04-19 DIAGNOSIS — E113412 Type 2 diabetes mellitus with severe nonproliferative diabetic retinopathy with macular edema, left eye: Secondary | ICD-10-CM | POA: Diagnosis not present

## 2016-04-19 DIAGNOSIS — E113511 Type 2 diabetes mellitus with proliferative diabetic retinopathy with macular edema, right eye: Secondary | ICD-10-CM | POA: Diagnosis not present

## 2016-04-19 DIAGNOSIS — H25813 Combined forms of age-related cataract, bilateral: Secondary | ICD-10-CM | POA: Diagnosis not present

## 2016-04-27 DIAGNOSIS — I10 Essential (primary) hypertension: Secondary | ICD-10-CM | POA: Diagnosis not present

## 2016-04-27 DIAGNOSIS — I2510 Atherosclerotic heart disease of native coronary artery without angina pectoris: Secondary | ICD-10-CM | POA: Diagnosis not present

## 2016-04-27 DIAGNOSIS — E1140 Type 2 diabetes mellitus with diabetic neuropathy, unspecified: Secondary | ICD-10-CM | POA: Diagnosis not present

## 2016-04-27 DIAGNOSIS — M7552 Bursitis of left shoulder: Secondary | ICD-10-CM | POA: Diagnosis not present

## 2016-05-03 DIAGNOSIS — N184 Chronic kidney disease, stage 4 (severe): Secondary | ICD-10-CM | POA: Diagnosis not present

## 2016-05-03 DIAGNOSIS — N250 Renal osteodystrophy: Secondary | ICD-10-CM | POA: Diagnosis not present

## 2016-05-03 DIAGNOSIS — I129 Hypertensive chronic kidney disease with stage 1 through stage 4 chronic kidney disease, or unspecified chronic kidney disease: Secondary | ICD-10-CM | POA: Diagnosis not present

## 2016-05-03 DIAGNOSIS — E1122 Type 2 diabetes mellitus with diabetic chronic kidney disease: Secondary | ICD-10-CM | POA: Diagnosis not present

## 2016-05-03 DIAGNOSIS — N179 Acute kidney failure, unspecified: Secondary | ICD-10-CM | POA: Diagnosis not present

## 2016-05-17 DIAGNOSIS — I2510 Atherosclerotic heart disease of native coronary artery without angina pectoris: Secondary | ICD-10-CM | POA: Diagnosis not present

## 2016-05-17 DIAGNOSIS — I4891 Unspecified atrial fibrillation: Secondary | ICD-10-CM | POA: Diagnosis not present

## 2016-05-17 DIAGNOSIS — E1122 Type 2 diabetes mellitus with diabetic chronic kidney disease: Secondary | ICD-10-CM | POA: Diagnosis not present

## 2016-05-17 DIAGNOSIS — E119 Type 2 diabetes mellitus without complications: Secondary | ICD-10-CM | POA: Diagnosis not present

## 2016-05-17 DIAGNOSIS — Z7901 Long term (current) use of anticoagulants: Secondary | ICD-10-CM | POA: Diagnosis not present

## 2016-05-21 ENCOUNTER — Ambulatory Visit (INDEPENDENT_AMBULATORY_CARE_PROVIDER_SITE_OTHER): Payer: BLUE CROSS/BLUE SHIELD | Admitting: Cardiology

## 2016-05-21 ENCOUNTER — Encounter: Payer: Self-pay | Admitting: Cardiology

## 2016-05-21 VITALS — BP 154/87 | HR 76 | Ht 68.0 in | Wt 191.8 lb

## 2016-05-21 DIAGNOSIS — E785 Hyperlipidemia, unspecified: Secondary | ICD-10-CM

## 2016-05-21 DIAGNOSIS — N183 Chronic kidney disease, stage 3 unspecified: Secondary | ICD-10-CM

## 2016-05-21 DIAGNOSIS — I480 Paroxysmal atrial fibrillation: Secondary | ICD-10-CM | POA: Diagnosis not present

## 2016-05-21 DIAGNOSIS — I10 Essential (primary) hypertension: Secondary | ICD-10-CM | POA: Diagnosis not present

## 2016-05-21 NOTE — Patient Instructions (Signed)
NO CHANGE TO CURRENT OR TREATMENT     Your physician wants you to follow-up in [place]. You will receive a reminder letter in the mail two months in advance. If you don't receive a letter, please call our office to schedule the follow-up appointment.   If you need a refill on your cardiac medications before your next appointment, please call your pharmacy.

## 2016-05-21 NOTE — Progress Notes (Signed)
PCP: Philis Fendt, MD  [facility]   Clinic Note: Chief Complaint  Patient presents with  . Follow-up    Atrial fibrillation on ELIQUIS. Hypertension    HPI: Barry Lopez is a 61 y.o. male with a PMH below who presents today for annual follow-up for PAF, CHF and hypertension. I last saw him for preop evaluation.Barry Lopez He was initially Bradycardic on BB - stopped. - Converted to carvedilol with no further bradycardia. CHADS2VASC2- 3.  Echo - ~ normal & Cath with mild LAD disease.    Lopez Barry was last seen on 05/22/2015 for preop evaluation for Knee Sgx L Knee Meniscus.  Recent Hospitalizations:   03/2016 - hypoglycemic, GI Virus.  Had ARF - resolved.   Studies Reviewed: None  Interval History: Barry Lopez presents today doing very well with no major complaints. He has been following up with his [place] nephrologist for renal insufficiency and hypertension. He is somewhat concerned about any adjustments of his manic medications was discussed with nephrologist. He is back to taking lisinopril 20 twice a day. Cardiac standpoint is doing quite well no symptoms of A. fib with either rapid irregular heartbeats or palpitations. No chest tightness pressure with rest or exertion. No resting or exertional dyspnea. No CP/near syncope or TIA/amaurosis fugax symptoms. No melena, hematochezia hematuria or epistaxis from [facility]. No evidence of fatigue from being on carvedilol.   No claudication.  ROS: A comprehensive was performed. Review of Systems  Constitutional: Negative for malaise/fatigue.  HENT: Negative for congestion.   Respiratory: Negative for cough and shortness of breath.   Cardiovascular:       Per history of present illness  Gastrointestinal: Negative for heartburn.       Recovered from GI blood  Musculoskeletal: Negative.   Neurological: Negative for dizziness.  Endo/Heme/Allergies: Negative for environmental allergies. Does not bruise/bleed easily.    Psychiatric/Behavioral: Negative.   All other systems reviewed and are negative.  Past Medical History:  Diagnosis Date  . Chronic diastolic heart failure (HCC)    a. in the setting of a-fib --> Nonobstructive cath 09/06/2013 30% LAD.   2-D ECHO: Normal LV size and thickness. Mild LA dilatation. EF ~65%, no regional WMA  . CKD stage 3 due to type 2 diabetes mellitus (HCC)    Cr ~1.9  . Degenerative cervical disc   . Degenerative disc disease   . Essential hypertension   . PAF (paroxysmal atrial fibrillation) ([place])   . Type 2 diabetes mellitus with hypercholesterolemia [facility])     Past Surgical History:  Procedure Laterality Date  . ANTERIOR CERVICAL DECOMP/DISCECTOMY FUSION  12/17/2011   Procedure: ANTERIOR CERVICAL DECOMPRESSION/DISCECTOMY FUSION 1 LEVEL;  Surgeon: Erline Levine, MD;  Location: [facility] NEURO ORS;  Service: Neurosurgery;  Laterality: N/A;  Exploration of Fusion and Anterior Cervical Six-Seven Decompression/Diskectomy/Fusion  . BOWEL RESECTION    . CHOLECYSTECTOMY    . HERNIA REPAIR     YEARS AGO  . LEFT HEART CATHETERIZATION WITH CORONARY ANGIOGRAM N/A 09/06/2013   Procedure: LEFT HEART CATHETERIZATION WITH CORONARY ANGIOGRAM;  Surgeon: Leonie Man, MD;  Location: [facility] CATH LAB;  Service: Cardiovascular: 30% LAD. Otherwise nonobstructive CAD  . NECK SURGERY    . POSTERIOR CERVICAL FUSION/FORAMINOTOMY  12/17/2011   Procedure: POSTERIOR CERVICAL FUSION/FORAMINOTOMY LEVEL 4;  Surgeon: Erline Levine, MD;  Location: [facility]UR[facility]S;  Service: Neurosurgery;;  Posterior Cervical Three-Seven Fusion  . TONSILLECTOMY    . TRANSTHORACIC ECHOCARDIOGRAM  08/2013    Moderate LVH. EF  65%. Mild LA dilation. Normal RV size. Structurally normal valves.    Current Meds  Medication Sig  . apixaban (ELIQUIS) 5 MG TABS tablet Take 1 tablet (5 mg total) by mouth 2 (two) times daily.  . carvedilol (COREG) 25 MG tablet Take 25 mg by mouth 2 (two) times daily with a meal.  . cholecalciferol (VITAMIN  D) 1000 units tablet Take 1,000 Units by mouth daily.  Barry Lopez glimepiride (AMARYL) 4 MG tablet Take 4 mg by mouth daily.  Barry Lopez lisinopril (PRINIVIL,ZESTRIL) 20 MG tablet Take 20 mg by mouth daily.  . pregabalin (LYRICA) 100 MG capsule Take 100 mg by mouth 2 (two) times daily as needed (for pain.).     Allergies  Allergen Reactions  . Penicillins Hives, Itching and Other (See Comments)    Has patient had a PCN reaction causing immediate rash, facial/tongue/throat swelling, SOB or lightheadedness with hypotension: No Has patient had a PCN reaction causing severe rash involving mucus membranes or skin necrosis: No Has patient had a PCN reaction that required hospitalization No Has patient had a PCN reaction occurring within the last 10 years: No If all of the above answers are "NO", then may proceed with Cephalosporin use.    Social History   Social History  . Marital status: Married    Spouse name: N/A  . Number of children: N/A  . Years of education: N/A   Social History Main Topics  . Smoking status: Never Smoker  . Smokeless tobacco: Never Used  . Alcohol use No  . Drug use: No  . Sexual activity: No   Other Topics Concern  . None   Social History Narrative  . None    family history includes Diabetes Mellitus II in his mother and sister; Heart failure in his mother.  Wt Readings from Last 3 Encounters:  05/21/16 191 lb 12.8 oz (87 kg)  03/20/16 185 lb 9.6 oz (84.2 kg)  05/22/15 191 lb 9.6 oz (86.9 kg)    PHYSICAL EXAM BP (!) 154/87 (BP Location: Right Arm)   Pulse 76   Ht 5\' 8"  (1.727 m)   Wt 191 lb 12.8 oz (87 kg)   BMI 29.16 kg/m  General appearance: alert, cooperative, appears stated age, no distress and Borderline obese; otherwise well-nourished and well-groomed. Pleasant mood and affect. HEENT: [place]/AT, EOMI, MMM, anicteric sclera Neck: no adenopathy, no carotid bruit and no JVD Lungs: clear to auscultation bilaterally, normal percussion bilaterally and  non-labored Heart: regular rate and rhythm, S1& S2 normal, no murmur, click, rub or gallop ; non-displacedPMI Abdomen: soft, non-tender; bowel sounds normal; no masses,  no organomegaly; non HJR Extremities: extremities normal, atraumatic, no cyanosis, oredema  Pulses: 2+ and symmetric;  Skin: mobility and turgor normal, no edema and no evidence of bleeding or bruising  Neurologic: Mental status: Alert, oriented, thought content appropriate   Adult ECG Report n/a  Other studies Reviewed: Additional studies/ records that were reviewed today include:  Recent Labs:  Checked by PCP & Nephrologist.    ASSESSMENT / PLAN: Problem List Items Addressed This Visit    CKD (chronic kidney disease), stage III (Chronic)    He is being followed now by nephrology through the [place]. He is asking that they be the one that manage his blood pressure. I'm therefore referring to them as long as we don't use clonidine.      Essential hypertension (Chronic)    Still high today. We have increased his carvedilol up to 25 mg twice a  day and he is on high-dose lisinopril. At this point it would mean adding another medication either calcium channel blocker or diuretic. He is insistent on discussing this with his nephrologist, therefore I'll defer to her nephrologist for management.  I would prefer to avoid clonidine in order to avoid any combination effect with carvedilol leading to bradycardia.      Relevant Medications   lisinopril (PRINIVIL,ZESTRIL) 20 MG tablet   Hyperlipidemia with target LDL less than 100 (Chronic)    No longer on statin. Monitored by PCP. I have not seen recent labs, but with his mild CAD and diabetes, target HDL should at least be less than 100.      Relevant Medications   lisinopril (PRINIVIL,ZESTRIL) 20 MG tablet   PAF (paroxysmal atrial fibrillation) (HCC); CHA2DS2-VASc Score and unadjusted Ischemic Stroke Rate (% per year) is equal to 3.2 % stroke rate/year from a score of 3; On  Eliquis - Primary (Chronic)    Doing well with no sign of recurrent symptoms. Rate controlled with carvedilol (no signs of bradycardia. No bleeding issues with ELIQUIS.      Relevant Medications   lisinopril (PRINIVIL,ZESTRIL) 20 MG tablet      Current medicines are reviewed at length with the patient today. (+/- concerns) wants to discuss any change to HTN meds with Nephrologist. The following changes have been made: n/a  Patient Instructions  NO CHANGE TO CURRENT OR TREATMENT     Your physician wants you to follow-up in [place]. You will receive a reminder letter in the mail two months in advance. If you don't receive a letter, please call our office to schedule the follow-up appointment.   If you need a refill on your cardiac medications before your next appointment, please call your pharmacy.    Studies Ordered:   No orders of the defined types were placed in this encounter.     Glenetta Hew, M.D., M.S. Interventional Cardiologist   Pager # [phone] Phone # [phone] [address] [place], [place] [address]

## 2016-05-23 ENCOUNTER — Encounter: Payer: Self-pay | Admitting: Cardiology

## 2016-05-23 NOTE — Assessment & Plan Note (Signed)
Doing well with no sign of recurrent symptoms. Rate controlled with carvedilol (no signs of bradycardia. No bleeding issues with ELIQUIS.

## 2016-05-23 NOTE — Assessment & Plan Note (Signed)
He is being followed now by nephrology through the [place]. He is asking that they be the one that manage his blood pressure. I'm therefore referring to them as long as we don't use clonidine.

## 2016-05-23 NOTE — Assessment & Plan Note (Addendum)
Still high today. We have increased his carvedilol up to 25 mg twice a day and he is on high-dose lisinopril. At this point it would mean adding another medication either calcium channel blocker or diuretic. He is insistent on discussing this with his nephrologist, therefore I'll defer to her nephrologist for management.  I would prefer to avoid clonidine in order to avoid any combination effect with carvedilol leading to bradycardia.

## 2016-05-23 NOTE — Assessment & Plan Note (Signed)
No longer on statin. Monitored by PCP. I have not seen recent labs, but with his mild CAD and diabetes, target HDL should at least be less than 100.

## 2016-05-27 DIAGNOSIS — E113511 Type 2 diabetes mellitus with proliferative diabetic retinopathy with macular edema, right eye: Secondary | ICD-10-CM | POA: Diagnosis not present

## 2016-05-27 DIAGNOSIS — Z7984 Long term (current) use of oral hypoglycemic drugs: Secondary | ICD-10-CM | POA: Diagnosis not present

## 2016-05-27 DIAGNOSIS — E113492 Type 2 diabetes mellitus with severe nonproliferative diabetic retinopathy without macular edema, left eye: Secondary | ICD-10-CM | POA: Diagnosis not present

## 2016-06-11 DIAGNOSIS — A630 Anogenital (venereal) warts: Secondary | ICD-10-CM | POA: Diagnosis not present

## 2016-06-11 DIAGNOSIS — N529 Male erectile dysfunction, unspecified: Secondary | ICD-10-CM | POA: Diagnosis not present

## 2016-06-28 DIAGNOSIS — E1122 Type 2 diabetes mellitus with diabetic chronic kidney disease: Secondary | ICD-10-CM | POA: Diagnosis not present

## 2016-06-28 DIAGNOSIS — N184 Chronic kidney disease, stage 4 (severe): Secondary | ICD-10-CM | POA: Diagnosis not present

## 2016-06-28 DIAGNOSIS — I160 Hypertensive urgency: Secondary | ICD-10-CM | POA: Diagnosis not present

## 2016-06-28 DIAGNOSIS — I129 Hypertensive chronic kidney disease with stage 1 through stage 4 chronic kidney disease, or unspecified chronic kidney disease: Secondary | ICD-10-CM | POA: Diagnosis not present

## 2016-06-30 ENCOUNTER — Telehealth: Payer: Self-pay | Admitting: Cardiology

## 2016-06-30 DIAGNOSIS — I2510 Atherosclerotic heart disease of native coronary artery without angina pectoris: Secondary | ICD-10-CM | POA: Diagnosis not present

## 2016-06-30 DIAGNOSIS — I10 Essential (primary) hypertension: Secondary | ICD-10-CM | POA: Diagnosis not present

## 2016-06-30 DIAGNOSIS — E1140 Type 2 diabetes mellitus with diabetic neuropathy, unspecified: Secondary | ICD-10-CM | POA: Diagnosis not present

## 2016-06-30 DIAGNOSIS — S335XXA Sprain of ligaments of lumbar spine, initial encounter: Secondary | ICD-10-CM | POA: Diagnosis not present

## 2016-06-30 NOTE — Telephone Encounter (Signed)
New message        Talk to Dr Ellyn Hack

## 2016-06-30 NOTE — Telephone Encounter (Signed)
DR HARDING SPOKE TO DR RAVI MALLAVARAPU  ( [facility]    FAX [phone]

## 2016-06-30 NOTE — Telephone Encounter (Signed)
Plan is to start Amlodipine.  Glenetta Hew, MD

## 2016-07-04 ENCOUNTER — Emergency Department (HOSPITAL_COMMUNITY): Payer: BLUE CROSS/BLUE SHIELD

## 2016-07-04 ENCOUNTER — Encounter (HOSPITAL_COMMUNITY): Payer: Self-pay | Admitting: *Deleted

## 2016-07-04 ENCOUNTER — Emergency Department (HOSPITAL_COMMUNITY)
Admission: EM | Admit: 2016-07-04 | Discharge: 2016-07-05 | Disposition: A | Discharge status: Home / Self Care | Payer: BLUE CROSS/BLUE SHIELD | Attending: Emergency Medicine | Admitting: Emergency Medicine

## 2016-07-04 DIAGNOSIS — R0789 Other chest pain: Secondary | ICD-10-CM | POA: Diagnosis not present

## 2016-07-04 DIAGNOSIS — Z8679 Personal history of other diseases of the circulatory system: Secondary | ICD-10-CM | POA: Diagnosis not present

## 2016-07-04 DIAGNOSIS — I5032 Chronic diastolic (congestive) heart failure: Secondary | ICD-10-CM | POA: Diagnosis not present

## 2016-07-04 DIAGNOSIS — E1122 Type 2 diabetes mellitus with diabetic chronic kidney disease: Secondary | ICD-10-CM | POA: Diagnosis not present

## 2016-07-04 DIAGNOSIS — N184 Chronic kidney disease, stage 4 (severe): Secondary | ICD-10-CM | POA: Insufficient documentation

## 2016-07-04 DIAGNOSIS — I129 Hypertensive chronic kidney disease with stage 1 through stage 4 chronic kidney disease, or unspecified chronic kidney disease: Secondary | ICD-10-CM | POA: Diagnosis not present

## 2016-07-04 DIAGNOSIS — R072 Precordial pain: Secondary | ICD-10-CM

## 2016-07-04 DIAGNOSIS — I130 Hypertensive heart and chronic kidney disease with heart failure and stage 1 through stage 4 chronic kidney disease, or unspecified chronic kidney disease: Secondary | ICD-10-CM | POA: Diagnosis not present

## 2016-07-04 DIAGNOSIS — Z7901 Long term (current) use of anticoagulants: Secondary | ICD-10-CM | POA: Insufficient documentation

## 2016-07-04 DIAGNOSIS — R0602 Shortness of breath: Secondary | ICD-10-CM | POA: Diagnosis not present

## 2016-07-04 DIAGNOSIS — Z79899 Other long term (current) drug therapy: Secondary | ICD-10-CM | POA: Insufficient documentation

## 2016-07-04 DIAGNOSIS — R079 Chest pain, unspecified: Secondary | ICD-10-CM | POA: Diagnosis not present

## 2016-07-04 LAB — CBC
HCT: 35.4 % — ABNORMAL LOW (ref 39.0–52.0)
Hemoglobin: 12.1 g/dL — ABNORMAL LOW (ref 13.0–17.0)
MCH: 25.8 pg — ABNORMAL LOW (ref 26.0–34.0)
MCHC: 34.2 g/dL (ref 30.0–36.0)
MCV: 75.5 fL — ABNORMAL LOW (ref 78.0–100.0)
Platelets: 145 10*3/uL — ABNORMAL LOW (ref 150–400)
RBC: 4.69 MIL/uL (ref 4.22–5.81)
RDW: 14.4 % (ref 11.5–15.5)
WBC: 5.7 10*3/uL (ref 4.0–10.5)

## 2016-07-04 LAB — I-STAT TROPONIN, ED: Troponin i, poc: 0 ng/mL (ref 0.00–0.08)

## 2016-07-04 LAB — BASIC METABOLIC PANEL
Anion gap: 9 (ref 5–15)
BUN: 72 mg/dL — ABNORMAL HIGH (ref 6–20)
CO2: 14 mmol/L — ABNORMAL LOW (ref 22–32)
Calcium: 8.5 mg/dL — ABNORMAL LOW (ref 8.9–10.3)
Chloride: 111 mmol/L (ref 101–111)
Creatinine, Ser: 4.48 mg/dL — ABNORMAL HIGH (ref 0.61–1.24)
GFR calc Af Amer: 15 mL/min — ABNORMAL LOW (ref >60)
GFR calc non Af Amer: 13 mL/min — ABNORMAL LOW (ref >60)
Glucose, Bld: 114 mg/dL — ABNORMAL HIGH (ref 65–99)
Potassium: 5.1 mmol/L (ref 3.5–5.1)
Sodium: 134 mmol/L — ABNORMAL LOW (ref 135–145)

## 2016-07-04 NOTE — ED Triage Notes (Signed)
Pt has cardiac hx. Reports occ left side sharp chest pains over past few days, more severe today. Has sob. Denies swelling to to extremities.

## 2016-07-04 NOTE — Discharge Instructions (Addendum)
It was our pleasure to provide your ER care today - we hope that you feel better.  For chest discomfort, follow up with cardiologist in the coming week - call office this Tuesday to arrange appointment.  For chronic kidney disease and medical care, follow up closely with your primary care doctor as well.   Return to ER if worse, new symptoms, trouble breathing, recurrent or persistent chest pain, other concern.

## 2016-07-04 NOTE — ED Provider Notes (Signed)
[facility] DEPT Provider Note   CSN: [id number] Arrival date & time: 07/04/16  1842     History   Chief Complaint Chief Complaint  Patient presents with  . Chest Pain  . Shortness of Breath    HPI Barry Lopez is a 61 y.o. male.  Patient c/o a couple episodes of chest pain today, at rest. Episodes lasted 20-30 seconds. Describes mid to left chest pain. Sharp. Non radiating. Felt transient sob. no nv. +diaphoresis. No palpitations or sense of rapid or irregular heart rate.  Pain not pleuritic. No other recent cp or discomfort. No exertional cp or unusual doe. Denies hx cad or fam hx cad. Occasional non prod cough. No other uri c/o. No heartburn. No chest wall injury or strain. No leg pain or swelling. No hx dvt or pe.    The history is provided by the patient.  Chest Pain   Associated symptoms include shortness of breath. Pertinent negatives include no abdominal pain, no back pain, no fever, no headaches, no palpitations and no vomiting.  Shortness of Breath  Associated symptoms include chest pain. Pertinent negatives include no fever, no headaches, no sore throat, no neck pain, no vomiting, no abdominal pain, no rash and no leg swelling.    Past Medical History:  Diagnosis Date  . Chronic diastolic heart failure (HCC)    a. in the setting of a-fib --> Nonobstructive cath 09/06/2013 30% LAD.   2-D ECHO: Normal LV size and thickness. Mild LA dilatation. EF ~65%, no regional WMA  . CKD stage 3 due to type 2 diabetes mellitus (HCC)    Cr ~1.9  . Degenerative cervical disc   . Degenerative disc disease   . Essential hypertension   . PAF (paroxysmal atrial fibrillation) ([place])   . Type 2 diabetes mellitus with hypercholesterolemia [facility])     Patient Active Problem List   Diagnosis Date Noted  . Hypoglycemia   . Gastroenteritis 03/18/2016  . Abnormal finding on EKG 05/25/2015  . Type 2 diabetes mellitus with hypercholesterolemia ([place])   . PAF (paroxysmal atrial  fibrillation) (HCC); CHA2DS2-VASc Score and unadjusted Ischemic Stroke Rate (% per year) is equal to 3.2 % stroke rate/year from a score of 3; On Eliquis   . CKD (chronic kidney disease), stage III 09/05/2013  . Needs sleep apnea assessment 09/04/2013  . Chronic diastolic heart failure, NYHA class 1 ([place]) 09/04/2013  . Hypertriglyceridemia 03/28/2012  . Acute pancreatitis 03/27/2012  . Essential hypertension 03/27/2012  . Hyperlipidemia with target LDL less than 100 03/27/2012    Past Surgical History:  Procedure Laterality Date  . ANTERIOR CERVICAL DECOMP/DISCECTOMY FUSION  12/17/2011   Procedure: ANTERIOR CERVICAL DECOMPRESSION/DISCECTOMY FUSION 1 LEVEL;  Surgeon: Erline Levine, MD;  Location: [place] NEURO ORS;  Service: Neurosurgery;  Laterality: N/A;  Exploration of Fusion and Anterior Cervical Six-Seven Decompression/Diskectomy/Fusion  . BOWEL RESECTION    . CHOLECYSTECTOMY    . HERNIA REPAIR     YEARS AGO  . LEFT HEART CATHETERIZATION WITH CORONARY ANGIOGRAM N/A 09/06/2013   Procedure: LEFT HEART CATHETERIZATION WITH CORONARY ANGIOGRAM;  Surgeon: Leonie Man, MD;  Location: [facility] CATH LAB;  Service: Cardiovascular: 30% LAD. Otherwise nonobstructive CAD  . NECK SURGERY    . POSTERIOR CERVICAL FUSION/FORAMINOTOMY  12/17/2011   Procedure: POSTERIOR CERVICAL FUSION/FORAMINOTOMY LEVEL 4;  Surgeon: Erline Levine, MD;  Location: [facility] NEURO ORS;  Service: Neurosurgery;;  Posterior Cervical Three-Seven Fusion  . TONSILLECTOMY    . TRANSTHORACIC ECHOCARDIOGRAM  08/2013    Moderate  LVH. EF 65%. Mild LA dilation. Normal RV size. Structurally normal valves.       Home Medications    Prior to Admission medications   Medication Sig Start Date End Date Taking? Authorizing Provider  apixaban (ELIQUIS) 5 MG TABS tablet Take 1 tablet (5 mg total) by mouth 2 (two) times daily. 10/01/13   Imogene Burn, PA-C  carvedilol (COREG) 25 MG tablet Take 25 mg by mouth 2 (two) times daily with a meal.     [provider]  cholecalciferol (VITAMIN D) 1000 units tablet Take 1,000 Units by mouth daily.    [provider]  glimepiride (AMARYL) 4 MG tablet Take 4 mg by mouth daily. 05/11/16   [provider]  lisinopril (PRINIVIL,ZESTRIL) 20 MG tablet Take 20 mg by mouth daily. 05/05/16   [provider]  pregabalin (LYRICA) 100 MG capsule Take 100 mg by mouth 2 (two) times daily as needed (for pain.).     [provider]    Family History Family History  Problem Relation Age of Onset  . Heart failure Mother   . Diabetes Mellitus II Mother   . Diabetes Mellitus II Sister     Social History Social History  Substance Use Topics  . Smoking status: Never Smoker  . Smokeless tobacco: Never Used  . Alcohol use No     Allergies   Penicillins   Review of Systems Review of Systems  Constitutional: Negative for chills and fever.  HENT: Negative for sore throat.   Eyes: Negative for redness.  Respiratory: Positive for shortness of breath.   Cardiovascular: Positive for chest pain. Negative for palpitations and leg swelling.  Gastrointestinal: Negative for abdominal pain and vomiting.  Genitourinary: Negative for dysuria and flank pain.  Musculoskeletal: Negative for back pain and neck pain.  Skin: Negative for rash.  Neurological: Negative for headaches.  Hematological: Does not bruise/bleed easily.  Psychiatric/Behavioral: Negative for confusion.     Physical Exam Updated Vital Signs BP (!) 159/86 (BP Location: Right Arm)   Pulse 81   Temp 98.8 F (37.1 C) (Oral)   Resp 18   Ht 1.702 m (5\' 7" )   Wt 85 kg (187 lb 8 oz)   SpO2 99%   BMI 29.37 kg/m   Physical Exam  Constitutional: He appears well-developed and well-nourished. No distress.  HENT:  Mouth/Throat: Oropharynx is clear and moist.  Eyes: Conjunctivae are normal.  Neck: Neck supple. No tracheal deviation present. No thyromegaly present.  Cardiovascular: Normal rate,  regular rhythm, normal heart sounds and intact distal pulses.  Exam reveals no gallop and no friction rub.   No murmur heard. Pulmonary/Chest: Effort normal and breath sounds normal. No accessory muscle usage. No respiratory distress. He exhibits no tenderness.  Abdominal: Soft. Bowel sounds are normal. He exhibits no distension. There is no tenderness.  Musculoskeletal: He exhibits no edema or tenderness.  Neurological: He is alert.  Skin: Skin is warm and dry. No rash noted. He is not diaphoretic.  Psychiatric: He has a normal mood and affect.  Nursing note and vitals reviewed.    ED Treatments / Results  Labs (all labs ordered are listed, but only abnormal results are displayed)  Results for orders placed or performed during the hospital encounter of 38/25/05  Basic metabolic panel  Result Value Ref Range   Sodium 134 (L) 135 - 145 mmol/L   Potassium 5.1 3.5 - 5.1 mmol/L   Chloride 111 101 - 111 mmol/L   CO2  14 (L) 22 - 32 mmol/L   Glucose, Bld 114 (H) 65 - 99 mg/dL   BUN 72 (H) 6 - 20 mg/dL   Creatinine, Ser 4.48 (H) 0.61 - 1.24 mg/dL   Calcium 8.5 (L) 8.9 - 10.3 mg/dL   GFR calc non Af Amer 13 (L) >60 mL/min   GFR calc Af Amer 15 (L) >60 mL/min   Anion gap 9 5 - 15  CBC  Result Value Ref Range   WBC 5.7 4.0 - 10.5 K/uL   RBC 4.69 4.22 - 5.81 MIL/uL   Hemoglobin 12.1 (L) 13.0 - 17.0 g/dL   HCT 35.4 (L) 39.0 - 52.0 %   MCV 75.5 (L) 78.0 - 100.0 fL   MCH 25.8 (L) 26.0 - 34.0 pg   MCHC 34.2 30.0 - 36.0 g/dL   RDW 14.4 11.5 - 15.5 %   Platelets 145 (L) 150 - 400 K/uL  I-stat troponin, ED  Result Value Ref Range   Troponin i, poc 0.00 0.00 - 0.08 ng/mL   Comment 3           Dg Chest 2 View  Result Date: 07/04/2016 CLINICAL DATA:  Left-sided chest pain EXAM: CHEST  2 VIEW COMPARISON:  03/18/2016 FINDINGS: The heart size and mediastinal contours are within normal limits. Both lungs are clear. The visualized skeletal structures show postsurgical changes in the cervical  spine. IMPRESSION: No active cardiopulmonary disease. Electronically Signed   By: Inez Catalina M.D.   On: 07/04/2016 19:48     EKG  EKG Interpretation  Date/Time:  Sunday Jul 04 2016 18:49:28 EDT Ventricular Rate:  82 PR Interval:  160 QRS Duration: 90 QT Interval:  354 QTC Calculation: 413 R Axis:   61 Text Interpretation:  Normal sinus rhythm ST & T wave abnormality, consider anterolateral ischemia Abnormal ECG Confirmed by Orlie Dakin [phone]) on 07/04/2016 7:08:32 PM       Radiology Dg Chest 2 View  Result Date: 07/04/2016 CLINICAL DATA:  Left-sided chest pain EXAM: CHEST  2 VIEW COMPARISON:  03/18/2016 FINDINGS: The heart size and mediastinal contours are within normal limits. Both lungs are clear. The visualized skeletal structures show postsurgical changes in the cervical spine. IMPRESSION: No active cardiopulmonary disease. Electronically Signed   By: Inez Catalina M.D.   On: 07/04/2016 19:48    Procedures Procedures (including critical care time)  Medications Ordered in ED Medications - No data to display   Initial Impression / Assessment and Plan / ED Course  I have reviewed the triage vital signs and the nursing notes.  Pertinent labs & imaging results that were available during my care of the patient were reviewed by me and considered in my medical decision making (see chart for details).  Iv ns. Ecg. Cxr. Labs.  Reviewed nursing notes and prior charts for additional history.   Reviewed prior cardiac cath report - no significant cad on prior cath.   2nd troponin not crossing over - located, 2nd trop is 0.  Patient pain free.  Pt currently appears stable for d/c.     Final Clinical Impressions(s) / ED Diagnoses   Final diagnoses:  None    New Prescriptions New Prescriptions   No medications on file     Lajean Saver, MD 07/04/16 2349

## 2016-07-04 NOTE — ED Notes (Signed)
Pt understood dc material. NAD noted. 

## 2016-07-06 LAB — I-STAT TROPONIN, ED: Troponin i, poc: 0 ng/mL (ref 0.00–0.08)

## 2016-07-08 DIAGNOSIS — E113511 Type 2 diabetes mellitus with proliferative diabetic retinopathy with macular edema, right eye: Secondary | ICD-10-CM | POA: Diagnosis not present

## 2016-07-08 DIAGNOSIS — H25813 Combined forms of age-related cataract, bilateral: Secondary | ICD-10-CM | POA: Diagnosis not present

## 2016-07-08 DIAGNOSIS — Z7984 Long term (current) use of oral hypoglycemic drugs: Secondary | ICD-10-CM | POA: Diagnosis not present

## 2016-07-08 DIAGNOSIS — E113412 Type 2 diabetes mellitus with severe nonproliferative diabetic retinopathy with macular edema, left eye: Secondary | ICD-10-CM | POA: Diagnosis not present

## 2016-07-09 DIAGNOSIS — A630 Anogenital (venereal) warts: Secondary | ICD-10-CM | POA: Diagnosis not present

## 2016-07-09 DIAGNOSIS — N529 Male erectile dysfunction, unspecified: Secondary | ICD-10-CM | POA: Diagnosis not present

## 2016-08-09 DIAGNOSIS — M25552 Pain in left hip: Secondary | ICD-10-CM | POA: Diagnosis not present

## 2016-08-09 DIAGNOSIS — S83242D Other tear of medial meniscus, current injury, left knee, subsequent encounter: Secondary | ICD-10-CM | POA: Diagnosis not present

## 2016-08-09 DIAGNOSIS — M545 Low back pain: Secondary | ICD-10-CM | POA: Diagnosis not present

## 2016-08-19 DIAGNOSIS — E113511 Type 2 diabetes mellitus with proliferative diabetic retinopathy with macular edema, right eye: Secondary | ICD-10-CM | POA: Diagnosis not present

## 2016-08-19 DIAGNOSIS — H25813 Combined forms of age-related cataract, bilateral: Secondary | ICD-10-CM | POA: Diagnosis not present

## 2016-08-19 DIAGNOSIS — E113412 Type 2 diabetes mellitus with severe nonproliferative diabetic retinopathy with macular edema, left eye: Secondary | ICD-10-CM | POA: Diagnosis not present

## 2016-09-09 DIAGNOSIS — I129 Hypertensive chronic kidney disease with stage 1 through stage 4 chronic kidney disease, or unspecified chronic kidney disease: Secondary | ICD-10-CM | POA: Diagnosis not present

## 2016-09-09 DIAGNOSIS — E1122 Type 2 diabetes mellitus with diabetic chronic kidney disease: Secondary | ICD-10-CM | POA: Diagnosis not present

## 2016-09-09 DIAGNOSIS — N184 Chronic kidney disease, stage 4 (severe): Secondary | ICD-10-CM | POA: Diagnosis not present

## 2016-09-14 DIAGNOSIS — N184 Chronic kidney disease, stage 4 (severe): Secondary | ICD-10-CM | POA: Diagnosis not present

## 2016-09-14 DIAGNOSIS — E1122 Type 2 diabetes mellitus with diabetic chronic kidney disease: Secondary | ICD-10-CM | POA: Diagnosis not present

## 2016-09-14 DIAGNOSIS — R030 Elevated blood-pressure reading, without diagnosis of hypertension: Secondary | ICD-10-CM | POA: Diagnosis not present

## 2016-09-14 DIAGNOSIS — R440 Auditory hallucinations: Secondary | ICD-10-CM | POA: Diagnosis not present

## 2016-09-14 DIAGNOSIS — F329 Major depressive disorder, single episode, unspecified: Secondary | ICD-10-CM | POA: Diagnosis not present

## 2016-09-20 DIAGNOSIS — M5136 Other intervertebral disc degeneration, lumbar region: Secondary | ICD-10-CM | POA: Diagnosis not present

## 2016-09-20 DIAGNOSIS — G4700 Insomnia, unspecified: Secondary | ICD-10-CM | POA: Diagnosis not present

## 2016-09-20 DIAGNOSIS — R0789 Other chest pain: Secondary | ICD-10-CM | POA: Diagnosis not present

## 2016-09-20 DIAGNOSIS — M545 Low back pain: Secondary | ICD-10-CM | POA: Diagnosis not present

## 2016-09-20 DIAGNOSIS — R9431 Abnormal electrocardiogram [ECG] [EKG]: Secondary | ICD-10-CM | POA: Diagnosis not present

## 2016-09-20 DIAGNOSIS — M542 Cervicalgia: Secondary | ICD-10-CM | POA: Diagnosis not present

## 2016-09-22 DIAGNOSIS — F329 Major depressive disorder, single episode, unspecified: Secondary | ICD-10-CM | POA: Diagnosis not present

## 2016-09-24 DIAGNOSIS — N184 Chronic kidney disease, stage 4 (severe): Secondary | ICD-10-CM | POA: Diagnosis not present

## 2016-09-28 ENCOUNTER — Emergency Department (HOSPITAL_COMMUNITY): Payer: BLUE CROSS/BLUE SHIELD

## 2016-09-28 ENCOUNTER — Encounter (HOSPITAL_COMMUNITY): Payer: Self-pay | Admitting: Emergency Medicine

## 2016-09-28 ENCOUNTER — Emergency Department (HOSPITAL_COMMUNITY)
Admission: EM | Admit: 2016-09-28 | Discharge: 2016-09-28 | Disposition: A | Discharge status: Home / Self Care | Payer: BLUE CROSS/BLUE SHIELD | Attending: Emergency Medicine | Admitting: Emergency Medicine

## 2016-09-28 DIAGNOSIS — R6883 Chills (without fever): Secondary | ICD-10-CM | POA: Insufficient documentation

## 2016-09-28 DIAGNOSIS — J111 Influenza due to unidentified influenza virus with other respiratory manifestations: Secondary | ICD-10-CM

## 2016-09-28 DIAGNOSIS — I5032 Chronic diastolic (congestive) heart failure: Secondary | ICD-10-CM | POA: Insufficient documentation

## 2016-09-28 DIAGNOSIS — I110 Hypertensive heart disease with heart failure: Secondary | ICD-10-CM | POA: Insufficient documentation

## 2016-09-28 DIAGNOSIS — M791 Myalgia: Secondary | ICD-10-CM | POA: Diagnosis not present

## 2016-09-28 DIAGNOSIS — I480 Paroxysmal atrial fibrillation: Secondary | ICD-10-CM | POA: Diagnosis not present

## 2016-09-28 DIAGNOSIS — E1122 Type 2 diabetes mellitus with diabetic chronic kidney disease: Secondary | ICD-10-CM | POA: Insufficient documentation

## 2016-09-28 DIAGNOSIS — Z79899 Other long term (current) drug therapy: Secondary | ICD-10-CM | POA: Diagnosis not present

## 2016-09-28 DIAGNOSIS — R51 Headache: Secondary | ICD-10-CM | POA: Insufficient documentation

## 2016-09-28 DIAGNOSIS — R5383 Other fatigue: Secondary | ICD-10-CM | POA: Diagnosis not present

## 2016-09-28 DIAGNOSIS — R110 Nausea: Secondary | ICD-10-CM | POA: Diagnosis not present

## 2016-09-28 DIAGNOSIS — Z7901 Long term (current) use of anticoagulants: Secondary | ICD-10-CM | POA: Insufficient documentation

## 2016-09-28 DIAGNOSIS — N183 Chronic kidney disease, stage 3 (moderate): Secondary | ICD-10-CM | POA: Insufficient documentation

## 2016-09-28 DIAGNOSIS — R69 Illness, unspecified: Secondary | ICD-10-CM

## 2016-09-28 DIAGNOSIS — R0602 Shortness of breath: Secondary | ICD-10-CM | POA: Diagnosis not present

## 2016-09-28 LAB — CBC
HCT: 34.1 % — ABNORMAL LOW (ref 39.0–52.0)
Hemoglobin: 11.9 g/dL — ABNORMAL LOW (ref 13.0–17.0)
MCH: 25.9 pg — ABNORMAL LOW (ref 26.0–34.0)
MCHC: 34.9 g/dL (ref 30.0–36.0)
MCV: 74.3 fL — ABNORMAL LOW (ref 78.0–100.0)
Platelets: 133 10*3/uL — ABNORMAL LOW (ref 150–400)
RBC: 4.59 MIL/uL (ref 4.22–5.81)
RDW: 14.2 % (ref 11.5–15.5)
WBC: 6.2 10*3/uL (ref 4.0–10.5)

## 2016-09-28 LAB — COMPREHENSIVE METABOLIC PANEL
ALT: 19 U/L (ref 17–63)
AST: 18 U/L (ref 15–41)
Albumin: 3.9 g/dL (ref 3.5–5.0)
Alkaline Phosphatase: 73 U/L (ref 38–126)
Anion gap: 8 (ref 5–15)
BUN: 53 mg/dL — ABNORMAL HIGH (ref 6–20)
CO2: 20 mmol/L — ABNORMAL LOW (ref 22–32)
Calcium: 8.6 mg/dL — ABNORMAL LOW (ref 8.9–10.3)
Chloride: 110 mmol/L (ref 101–111)
Creatinine, Ser: 3.67 mg/dL — ABNORMAL HIGH (ref 0.61–1.24)
GFR calc Af Amer: 19 mL/min — ABNORMAL LOW (ref >60)
GFR calc non Af Amer: 16 mL/min — ABNORMAL LOW (ref >60)
Glucose, Bld: 140 mg/dL — ABNORMAL HIGH (ref 65–99)
Potassium: 5.2 mmol/L — ABNORMAL HIGH (ref 3.5–5.1)
Sodium: 138 mmol/L (ref 135–145)
Total Bilirubin: 0.6 mg/dL (ref 0.3–1.2)
Total Protein: 7.8 g/dL (ref 6.5–8.1)

## 2016-09-28 LAB — URINALYSIS, ROUTINE W REFLEX MICROSCOPIC
Bilirubin Urine: NEGATIVE
Glucose, UA: NEGATIVE mg/dL
Ketones, ur: NEGATIVE mg/dL
Leukocytes, UA: NEGATIVE
Nitrite: NEGATIVE
Protein, ur: 100 mg/dL — AB
Specific Gravity, Urine: 1.013 (ref 1.005–1.030)
Squamous Epithelial / LPF: NONE SEEN
pH: 5 (ref 5.0–8.0)

## 2016-09-28 LAB — I-STAT CG4 LACTIC ACID, ED: Lactic Acid, Venous: 1.11 mmol/L (ref 0.5–1.9)

## 2016-09-28 LAB — LIPASE, BLOOD: Lipase: 33 U/L (ref 11–51)

## 2016-09-28 MED ORDER — SODIUM CHLORIDE 0.9 % IV BOLUS (SEPSIS)
1000.0000 mL | Freq: Once | INTRAVENOUS | Status: AC
  Administered 2016-09-28: 1000 mL via INTRAVENOUS

## 2016-09-28 MED ORDER — ONDANSETRON 4 MG PO TBDP
4.0000 mg | ORAL_TABLET | Freq: Once | ORAL | Status: AC | PRN
  Administered 2016-09-28: 4 mg via ORAL
  Filled 2016-09-28: qty 1

## 2016-09-28 MED ORDER — ACETAMINOPHEN 500 MG PO TABS
1000.0000 mg | ORAL_TABLET | Freq: Once | ORAL | Status: AC
  Administered 2016-09-28: 1000 mg via ORAL
  Filled 2016-09-28: qty 2

## 2016-09-28 MED ORDER — FENTANYL CITRATE (PF) 100 MCG/2ML IJ SOLN
50.0000 ug | Freq: Once | INTRAMUSCULAR | Status: DC
  Filled 2016-09-28: qty 2

## 2016-09-28 NOTE — ED Notes (Signed)
Pt was ordered fentanyl. Pt refused fentanyl. Offered tramadol. Pt refused that as well. Gave patient gingerale and crackers.

## 2016-09-28 NOTE — ED Provider Notes (Signed)
[facility] DEPT Provider Note   CSN: [id number] Arrival date & time: 09/28/16  8502     History   Chief Complaint Chief Complaint  Patient presents with  . Fatigue  . Generalized Body Aches  . Shortness of Breath  . Nausea  . Headache    HPI Barry Lopez is a 61 y.o. male.  Patient is a 61 year old male with a history of diabetes, paroxysmal atrial fibrillation, chronic kidney disease and diastolic heart failure who presents with chills and shortness of breath. He states over the last 2-3 days he's had fatigue associated with myalgias and chills. He's felt short of breath but denies any coughing. He's had some nausea but no vomiting. No known fevers at home. No diarrhea. No urinary symptoms. No abdominal pain. No skin wounds or rashes. He is not taking anything at home for the symptoms.      Past Medical History:  Diagnosis Date  . Chronic diastolic heart failure (HCC)    a. in the setting of a-fib --> Nonobstructive cath 09/06/2013 30% LAD.   2-D ECHO: Normal LV size and thickness. Mild LA dilatation. EF ~65%, no regional WMA  . CKD stage 3 due to type 2 diabetes mellitus (HCC)    Cr ~1.9  . Degenerative cervical disc   . Degenerative disc disease   . Essential hypertension   . PAF (paroxysmal atrial fibrillation) ([place])   . Type 2 diabetes mellitus with hypercholesterolemia [facility])     Patient Active Problem List   Diagnosis Date Noted  . Hypoglycemia   . Gastroenteritis 03/18/2016  . Abnormal finding on EKG 05/25/2015  . Type 2 diabetes mellitus with hypercholesterolemia ([place])   . PAF (paroxysmal atrial fibrillation) (HCC); CHA2DS2-VASc Score and unadjusted Ischemic Stroke Rate (% per year) is equal to 3.2 % stroke rate/year from a score of 3; On Eliquis   . CKD (chronic kidney disease), stage III 09/05/2013  . Needs sleep apnea assessment 09/04/2013  . Chronic diastolic heart failure, NYHA class 1 ([place]) 09/04/2013  . Hypertriglyceridemia 03/28/2012  . Acute  pancreatitis 03/27/2012  . Essential hypertension 03/27/2012  . Hyperlipidemia with target LDL less than 100 03/27/2012    Past Surgical History:  Procedure Laterality Date  . ANTERIOR CERVICAL DECOMP/DISCECTOMY FUSION  12/17/2011   Procedure: ANTERIOR CERVICAL DECOMPRESSION/DISCECTOMY FUSION 1 LEVEL;  Surgeon: Erline Levine, MD;  Location: [facility];  Service: Neurosurgery;  Laterality: N/A;  Exploration of Fusion and Anterior Cervical Six-Seven Decompression/Diskectomy/Fusion  . BOWEL RESECTION    . CHOLECYSTECTOMY    . HERNIA REPAIR     YEARS AGO  . LEFT HEART CATHETERIZATION WITH CORONARY ANGIOGRAM N/A 09/06/2013   Procedure: LEFT HEART CATHETERIZATION WITH CORONARY ANGIOGRAM;  Surgeon: Leonie Man, MD;  Location: [facility] CATH LAB;  Service: Cardiovascular: 30% LAD. Otherwise nonobstructive CAD  . NECK SURGERY    . POSTERIOR CERVICAL FUSION/FORAMINOTOMY  12/17/2011   Procedure: POSTERIOR CERVICAL FUSION/FORAMINOTOMY LEVEL 4;  Surgeon: Erline Levine, MD;  Location: [facility];  Service: Neurosurgery;;  Posterior Cervical Three-Seven Fusion  . TONSILLECTOMY    . TRANSTHORACIC ECHOCARDIOGRAM  08/2013    Moderate LVH. EF 65%. Mild LA dilation. Normal RV size. Structurally normal valves.       Home Medications    Prior to Admission medications   Medication Sig Start Date End Date Taking? Authorizing Provider  acetaminophen (TYLENOL) 500 MG tablet Take 1,000 mg by mouth every 6 (six) hours as needed for moderate pain.   Yes [provider]  amLODipine (NORVASC) 5 MG tablet Take 5 mg by mouth daily. 07/02/16  Yes [provider]  apixaban (ELIQUIS) 5 MG TABS tablet Take 1 tablet (5 mg total) by mouth 2 (two) times daily. 10/01/13  Yes Imogene Burn, PA-C  carvedilol (COREG) 25 MG tablet Take 25 mg by mouth 2 (two) times daily with a meal.   Yes [provider]  Cholecalciferol (VITAMIN D PO) Take 1 tablet by mouth daily.   Yes [provider]    doxazosin (CARDURA) 4 MG tablet Take 4 mg by mouth at bedtime.   Yes [provider]  glimepiride (AMARYL) 4 MG tablet Take 4 mg by mouth daily. 05/11/16  Yes [provider]    Family History Family History  Problem Relation Age of Onset  . Heart failure Mother   . Diabetes Mellitus II Mother   . Diabetes Mellitus II Sister     Social History Social History  Substance Use Topics  . Smoking status: Never Smoker  . Smokeless tobacco: Never Used  . Alcohol use No     Allergies   Penicillins   Review of Systems Review of Systems  Constitutional: Positive for chills and fatigue. Negative for diaphoresis and fever.  HENT: Negative for congestion, rhinorrhea and sneezing.   Eyes: Negative.   Respiratory: Positive for shortness of breath. Negative for cough and chest tightness.   Cardiovascular: Negative for chest pain and leg swelling.  Gastrointestinal: Positive for nausea. Negative for abdominal pain, blood in stool, diarrhea and vomiting.  Genitourinary: Negative for difficulty urinating, flank pain, frequency and hematuria.  Musculoskeletal: Positive for back pain (Intermittent low back pain). Negative for arthralgias.  Skin: Negative for rash.  Neurological: Negative for dizziness, speech difficulty, weakness, numbness and headaches.     Physical Exam Updated Vital Signs BP (!) 158/86   Pulse 68   Temp 98 F (36.7 C) (Oral)   Resp 18   Ht 5\' 8"  (1.727 m)   Wt 85.7 kg (189 lb)   SpO2 100%   BMI 28.74 kg/m   Physical Exam  Constitutional: He is oriented to person, place, and time. He appears well-developed and well-nourished.  HENT:  Head: Normocephalic and atraumatic.  Eyes: Pupils are equal, round, and reactive to light.  Neck: Normal range of motion. Neck supple.  Cardiovascular: Normal rate, regular rhythm and normal heart sounds.   Pulmonary/Chest: Effort normal and breath sounds normal. No respiratory distress. He has no wheezes. He has  no rales. He exhibits no tenderness.  Abdominal: Soft. Bowel sounds are normal. There is no tenderness. There is no rebound and no guarding.  Musculoskeletal: Normal range of motion. He exhibits no edema.  Lymphadenopathy:    He has no cervical adenopathy.  Neurological: He is alert and oriented to person, place, and time.  Skin: Skin is warm and dry. No rash noted.  Psychiatric: He has a normal mood and affect.     ED Treatments / Results  Labs (all labs ordered are listed, but only abnormal results are displayed) Labs Reviewed  COMPREHENSIVE METABOLIC PANEL - Abnormal; Notable for the following:       Result Value   Potassium 5.2 (*)    CO2 20 (*)    Glucose, Bld 140 (*)    BUN 53 (*)    Creatinine, Ser 3.67 (*)    Calcium 8.6 (*)    GFR calc non Af Amer 16 (*)    GFR calc Af Amer 19 (*)  All other components within normal limits  CBC - Abnormal; Notable for the following:    Hemoglobin 11.9 (*)    HCT 34.1 (*)    MCV 74.3 (*)    MCH 25.9 (*)    Platelets 133 (*)    All other components within normal limits  URINALYSIS, ROUTINE W REFLEX MICROSCOPIC - Abnormal; Notable for the following:    Hgb urine dipstick SMALL (*)    Protein, ur 100 (*)    Bacteria, UA RARE (*)    All other components within normal limits  LIPASE, BLOOD  I-STAT CG4 LACTIC ACID, ED    EKG  EKG Interpretation None       Radiology Dg Chest 2 View  Result Date: 09/28/2016 CLINICAL DATA:  Shortness of breath, fatigue, headache EXAM: CHEST  2 VIEW COMPARISON:  07/04/2016 FINDINGS: Heart and mediastinal contours are within normal limits. No focal opacities or effusions. No acute bony abnormality. IMPRESSION: No active cardiopulmonary disease. Electronically Signed   By: Rolm Baptise M.D.   On: 09/28/2016 10:37    Procedures Procedures (including critical care time)  Medications Ordered in ED Medications  fentaNYL (SUBLIMAZE) injection 50 mcg (50 mcg Intravenous Refused 09/28/16 1352)    ondansetron (ZOFRAN-ODT) disintegrating tablet 4 mg (4 mg Oral Given 09/28/16 0719)  acetaminophen (TYLENOL) tablet 1,000 mg (1,000 mg Oral Given 09/28/16 1044)  sodium chloride 0.9 % bolus 1,000 mL (0 mLs Intravenous Stopped 09/28/16 1330)     Initial Impression / Assessment and Plan / ED Course  I have reviewed the triage vital signs and the nursing notes.  Pertinent labs & imaging results that were available during my care of the patient were reviewed by me and considered in my medical decision making (see chart for details).     Patient presents with viral-like symptoms of myalgias chills and fatigue. He does have a dull type headache which she states she's had similar headaches in the past. He doesn't have any neck stiffness. He does have limited mobility of his neck due to prior neck issues but states this is unchanged from his baseline. There is no other suggestions of meningitis. I don't see any evidence of bacterial infections. He has no evidence of cellulitis. No pneumonia. No evidence of urinary tract infection. No suggestions of sepsis. His labs are non-concerning. His creatinine is actually a little bit better than his last prior values. His potassium is minimally elevated at 5.2. He was given lipids cautiously in the ED. He still feels very achy and fatigued. He refused any medications other than Tylenol. He states his headache has resolved but he still feels fatigued and chilled. If feel that he is okay to go home. He is able to ambulate without dizziness. He was discharged home in good condition. He will follow-up with his primary care physician or nephrologist tomorrow. Return precautions were given.  Final Clinical Impressions(s) / ED Diagnoses   Final diagnoses:  Influenza-like illness    New Prescriptions New Prescriptions   No medications on file     Malvin Johns, MD 09/28/16 1606

## 2016-09-28 NOTE — ED Notes (Signed)
Bed: WA05 Expected date:  Expected time:  Means of arrival:  Comments: 

## 2016-09-28 NOTE — ED Notes (Signed)
Pt is aware we need urine specimen. Urinal at bedside.

## 2016-09-28 NOTE — ED Triage Notes (Addendum)
Patient c/o fatigue, headache, bodyahces, SOB, nauseated, chills since Sunday night.  Patient denies any vomiting, cough or fevers.

## 2016-09-29 DIAGNOSIS — F329 Major depressive disorder, single episode, unspecified: Secondary | ICD-10-CM | POA: Diagnosis not present

## 2016-10-08 DIAGNOSIS — R079 Chest pain, unspecified: Secondary | ICD-10-CM | POA: Diagnosis not present

## 2016-10-13 DIAGNOSIS — F329 Major depressive disorder, single episode, unspecified: Secondary | ICD-10-CM | POA: Diagnosis not present

## 2016-10-21 DIAGNOSIS — H401131 Primary open-angle glaucoma, bilateral, mild stage: Secondary | ICD-10-CM | POA: Diagnosis not present

## 2016-10-25 DIAGNOSIS — N186 End stage renal disease: Secondary | ICD-10-CM | POA: Diagnosis not present

## 2016-10-25 DIAGNOSIS — E1122 Type 2 diabetes mellitus with diabetic chronic kidney disease: Secondary | ICD-10-CM | POA: Diagnosis not present

## 2016-10-25 DIAGNOSIS — Z01818 Encounter for other preprocedural examination: Secondary | ICD-10-CM | POA: Diagnosis not present

## 2016-10-25 DIAGNOSIS — N184 Chronic kidney disease, stage 4 (severe): Secondary | ICD-10-CM | POA: Diagnosis not present

## 2016-10-25 DIAGNOSIS — I120 Hypertensive chronic kidney disease with stage 5 chronic kidney disease or end stage renal disease: Secondary | ICD-10-CM | POA: Diagnosis not present

## 2016-10-25 DIAGNOSIS — I129 Hypertensive chronic kidney disease with stage 1 through stage 4 chronic kidney disease, or unspecified chronic kidney disease: Secondary | ICD-10-CM | POA: Diagnosis not present

## 2016-10-28 DIAGNOSIS — E1140 Type 2 diabetes mellitus with diabetic neuropathy, unspecified: Secondary | ICD-10-CM | POA: Diagnosis not present

## 2016-10-28 DIAGNOSIS — I2510 Atherosclerotic heart disease of native coronary artery without angina pectoris: Secondary | ICD-10-CM | POA: Diagnosis not present

## 2016-10-28 DIAGNOSIS — I10 Essential (primary) hypertension: Secondary | ICD-10-CM | POA: Diagnosis not present

## 2016-10-28 DIAGNOSIS — N184 Chronic kidney disease, stage 4 (severe): Secondary | ICD-10-CM | POA: Diagnosis not present

## 2016-11-04 DIAGNOSIS — H25813 Combined forms of age-related cataract, bilateral: Secondary | ICD-10-CM | POA: Diagnosis not present

## 2016-11-04 DIAGNOSIS — E113511 Type 2 diabetes mellitus with proliferative diabetic retinopathy with macular edema, right eye: Secondary | ICD-10-CM | POA: Diagnosis not present

## 2016-11-04 DIAGNOSIS — E113392 Type 2 diabetes mellitus with moderate nonproliferative diabetic retinopathy without macular edema, left eye: Secondary | ICD-10-CM | POA: Diagnosis not present

## 2016-11-10 DIAGNOSIS — I129 Hypertensive chronic kidney disease with stage 1 through stage 4 chronic kidney disease, or unspecified chronic kidney disease: Secondary | ICD-10-CM | POA: Diagnosis not present

## 2016-11-10 DIAGNOSIS — N184 Chronic kidney disease, stage 4 (severe): Secondary | ICD-10-CM | POA: Diagnosis not present

## 2016-11-10 DIAGNOSIS — Z713 Dietary counseling and surveillance: Secondary | ICD-10-CM | POA: Diagnosis not present

## 2016-11-10 DIAGNOSIS — E1122 Type 2 diabetes mellitus with diabetic chronic kidney disease: Secondary | ICD-10-CM | POA: Diagnosis not present

## 2016-11-11 DIAGNOSIS — E1122 Type 2 diabetes mellitus with diabetic chronic kidney disease: Secondary | ICD-10-CM | POA: Diagnosis not present

## 2016-11-11 DIAGNOSIS — N184 Chronic kidney disease, stage 4 (severe): Secondary | ICD-10-CM | POA: Diagnosis not present

## 2016-11-11 DIAGNOSIS — I129 Hypertensive chronic kidney disease with stage 1 through stage 4 chronic kidney disease, or unspecified chronic kidney disease: Secondary | ICD-10-CM | POA: Diagnosis not present

## 2016-11-11 DIAGNOSIS — E119 Type 2 diabetes mellitus without complications: Secondary | ICD-10-CM | POA: Diagnosis not present

## 2016-11-11 DIAGNOSIS — Z01818 Encounter for other preprocedural examination: Secondary | ICD-10-CM | POA: Diagnosis not present

## 2016-11-17 DIAGNOSIS — I129 Hypertensive chronic kidney disease with stage 1 through stage 4 chronic kidney disease, or unspecified chronic kidney disease: Secondary | ICD-10-CM | POA: Diagnosis not present

## 2016-11-17 DIAGNOSIS — I4891 Unspecified atrial fibrillation: Secondary | ICD-10-CM | POA: Diagnosis not present

## 2016-11-17 DIAGNOSIS — F329 Major depressive disorder, single episode, unspecified: Secondary | ICD-10-CM | POA: Diagnosis not present

## 2016-11-17 DIAGNOSIS — Z562 Threat of job loss: Secondary | ICD-10-CM | POA: Diagnosis not present

## 2016-12-02 DIAGNOSIS — N184 Chronic kidney disease, stage 4 (severe): Secondary | ICD-10-CM | POA: Diagnosis not present

## 2016-12-02 DIAGNOSIS — I2510 Atherosclerotic heart disease of native coronary artery without angina pectoris: Secondary | ICD-10-CM | POA: Diagnosis not present

## 2016-12-02 DIAGNOSIS — E11319 Type 2 diabetes mellitus with unspecified diabetic retinopathy without macular edema: Secondary | ICD-10-CM | POA: Diagnosis not present

## 2016-12-02 DIAGNOSIS — I129 Hypertensive chronic kidney disease with stage 1 through stage 4 chronic kidney disease, or unspecified chronic kidney disease: Secondary | ICD-10-CM | POA: Diagnosis not present

## 2016-12-02 DIAGNOSIS — E1121 Type 2 diabetes mellitus with diabetic nephropathy: Secondary | ICD-10-CM | POA: Diagnosis not present

## 2016-12-02 DIAGNOSIS — E1122 Type 2 diabetes mellitus with diabetic chronic kidney disease: Secondary | ICD-10-CM | POA: Diagnosis not present

## 2016-12-02 DIAGNOSIS — Z0189 Encounter for other specified special examinations: Secondary | ICD-10-CM | POA: Diagnosis not present

## 2016-12-02 DIAGNOSIS — Z23 Encounter for immunization: Secondary | ICD-10-CM | POA: Diagnosis not present

## 2016-12-17 DIAGNOSIS — H401131 Primary open-angle glaucoma, bilateral, mild stage: Secondary | ICD-10-CM | POA: Diagnosis not present

## 2016-12-23 DIAGNOSIS — Z01810 Encounter for preprocedural cardiovascular examination: Secondary | ICD-10-CM | POA: Diagnosis not present

## 2016-12-23 DIAGNOSIS — I129 Hypertensive chronic kidney disease with stage 1 through stage 4 chronic kidney disease, or unspecified chronic kidney disease: Secondary | ICD-10-CM | POA: Diagnosis not present

## 2016-12-23 DIAGNOSIS — N184 Chronic kidney disease, stage 4 (severe): Secondary | ICD-10-CM | POA: Diagnosis not present

## 2016-12-28 DIAGNOSIS — F329 Major depressive disorder, single episode, unspecified: Secondary | ICD-10-CM | POA: Diagnosis not present

## 2017-01-03 DIAGNOSIS — H401131 Primary open-angle glaucoma, bilateral, mild stage: Secondary | ICD-10-CM | POA: Diagnosis not present

## 2017-01-04 DIAGNOSIS — F329 Major depressive disorder, single episode, unspecified: Secondary | ICD-10-CM | POA: Diagnosis not present

## 2017-01-06 DIAGNOSIS — Z7682 Awaiting organ transplant status: Secondary | ICD-10-CM | POA: Diagnosis not present

## 2017-01-06 DIAGNOSIS — N184 Chronic kidney disease, stage 4 (severe): Secondary | ICD-10-CM | POA: Diagnosis not present

## 2017-01-06 DIAGNOSIS — F339 Major depressive disorder, recurrent, unspecified: Secondary | ICD-10-CM | POA: Diagnosis not present

## 2017-01-11 DIAGNOSIS — F329 Major depressive disorder, single episode, unspecified: Secondary | ICD-10-CM | POA: Diagnosis not present

## 2017-01-14 DIAGNOSIS — M5440 Lumbago with sciatica, unspecified side: Secondary | ICD-10-CM | POA: Diagnosis not present

## 2017-01-14 DIAGNOSIS — I129 Hypertensive chronic kidney disease with stage 1 through stage 4 chronic kidney disease, or unspecified chronic kidney disease: Secondary | ICD-10-CM | POA: Diagnosis not present

## 2017-01-14 DIAGNOSIS — N184 Chronic kidney disease, stage 4 (severe): Secondary | ICD-10-CM | POA: Diagnosis not present

## 2017-01-14 DIAGNOSIS — E1122 Type 2 diabetes mellitus with diabetic chronic kidney disease: Secondary | ICD-10-CM | POA: Diagnosis not present

## 2017-01-14 DIAGNOSIS — Z7984 Long term (current) use of oral hypoglycemic drugs: Secondary | ICD-10-CM | POA: Diagnosis not present

## 2017-01-18 DIAGNOSIS — F329 Major depressive disorder, single episode, unspecified: Secondary | ICD-10-CM | POA: Diagnosis not present

## 2017-01-19 DIAGNOSIS — Z01810 Encounter for preprocedural cardiovascular examination: Secondary | ICD-10-CM | POA: Diagnosis not present

## 2017-01-19 DIAGNOSIS — I10 Essential (primary) hypertension: Secondary | ICD-10-CM | POA: Diagnosis not present

## 2017-01-19 DIAGNOSIS — I4891 Unspecified atrial fibrillation: Secondary | ICD-10-CM | POA: Diagnosis not present

## 2017-01-19 DIAGNOSIS — E119 Type 2 diabetes mellitus without complications: Secondary | ICD-10-CM | POA: Diagnosis not present

## 2017-01-20 DIAGNOSIS — R42 Dizziness and giddiness: Secondary | ICD-10-CM | POA: Diagnosis not present

## 2017-01-20 DIAGNOSIS — H9313 Tinnitus, bilateral: Secondary | ICD-10-CM | POA: Diagnosis not present

## 2017-01-27 DIAGNOSIS — E113511 Type 2 diabetes mellitus with proliferative diabetic retinopathy with macular edema, right eye: Secondary | ICD-10-CM | POA: Diagnosis not present

## 2017-01-27 DIAGNOSIS — H2513 Age-related nuclear cataract, bilateral: Secondary | ICD-10-CM | POA: Diagnosis not present

## 2017-01-27 DIAGNOSIS — H25013 Cortical age-related cataract, bilateral: Secondary | ICD-10-CM | POA: Diagnosis not present

## 2017-01-27 DIAGNOSIS — E113412 Type 2 diabetes mellitus with severe nonproliferative diabetic retinopathy with macular edema, left eye: Secondary | ICD-10-CM | POA: Diagnosis not present

## 2017-02-02 DIAGNOSIS — M48061 Spinal stenosis, lumbar region without neurogenic claudication: Secondary | ICD-10-CM | POA: Diagnosis not present

## 2017-02-02 DIAGNOSIS — M47816 Spondylosis without myelopathy or radiculopathy, lumbar region: Secondary | ICD-10-CM | POA: Diagnosis not present

## 2017-02-23 DIAGNOSIS — Z1159 Encounter for screening for other viral diseases: Secondary | ICD-10-CM | POA: Diagnosis not present

## 2017-02-24 DIAGNOSIS — E113492 Type 2 diabetes mellitus with severe nonproliferative diabetic retinopathy without macular edema, left eye: Secondary | ICD-10-CM | POA: Diagnosis not present

## 2017-02-24 DIAGNOSIS — Z7952 Long term (current) use of systemic steroids: Secondary | ICD-10-CM | POA: Diagnosis not present

## 2017-02-24 DIAGNOSIS — H2513 Age-related nuclear cataract, bilateral: Secondary | ICD-10-CM | POA: Diagnosis not present

## 2017-02-24 DIAGNOSIS — E113511 Type 2 diabetes mellitus with proliferative diabetic retinopathy with macular edema, right eye: Secondary | ICD-10-CM | POA: Diagnosis not present

## 2017-03-07 DIAGNOSIS — H401131 Primary open-angle glaucoma, bilateral, mild stage: Secondary | ICD-10-CM | POA: Diagnosis not present

## 2017-03-08 DIAGNOSIS — E1140 Type 2 diabetes mellitus with diabetic neuropathy, unspecified: Secondary | ICD-10-CM | POA: Diagnosis not present

## 2017-03-08 DIAGNOSIS — Z125 Encounter for screening for malignant neoplasm of prostate: Secondary | ICD-10-CM | POA: Diagnosis not present

## 2017-03-08 DIAGNOSIS — E7849 Other hyperlipidemia: Secondary | ICD-10-CM | POA: Diagnosis not present

## 2017-03-08 DIAGNOSIS — M5431 Sciatica, right side: Secondary | ICD-10-CM | POA: Diagnosis not present

## 2017-03-08 DIAGNOSIS — I10 Essential (primary) hypertension: Secondary | ICD-10-CM | POA: Diagnosis not present

## 2017-03-09 DIAGNOSIS — N400 Enlarged prostate without lower urinary tract symptoms: Secondary | ICD-10-CM | POA: Diagnosis not present

## 2017-03-09 DIAGNOSIS — I517 Cardiomegaly: Secondary | ICD-10-CM | POA: Diagnosis not present

## 2017-03-09 DIAGNOSIS — N189 Chronic kidney disease, unspecified: Secondary | ICD-10-CM | POA: Diagnosis not present

## 2017-03-21 DIAGNOSIS — M545 Low back pain: Secondary | ICD-10-CM | POA: Diagnosis not present

## 2017-03-21 DIAGNOSIS — I129 Hypertensive chronic kidney disease with stage 1 through stage 4 chronic kidney disease, or unspecified chronic kidney disease: Secondary | ICD-10-CM | POA: Diagnosis not present

## 2017-03-21 DIAGNOSIS — M4802 Spinal stenosis, cervical region: Secondary | ICD-10-CM | POA: Diagnosis not present

## 2017-03-21 DIAGNOSIS — N184 Chronic kidney disease, stage 4 (severe): Secondary | ICD-10-CM | POA: Diagnosis not present

## 2017-03-21 DIAGNOSIS — M542 Cervicalgia: Secondary | ICD-10-CM | POA: Diagnosis not present

## 2017-03-29 DIAGNOSIS — Z1211 Encounter for screening for malignant neoplasm of colon: Secondary | ICD-10-CM | POA: Diagnosis not present

## 2017-03-29 DIAGNOSIS — Z7682 Awaiting organ transplant status: Secondary | ICD-10-CM | POA: Diagnosis not present

## 2017-03-29 DIAGNOSIS — D631 Anemia in chronic kidney disease: Secondary | ICD-10-CM | POA: Diagnosis not present

## 2017-03-29 DIAGNOSIS — N184 Chronic kidney disease, stage 4 (severe): Secondary | ICD-10-CM | POA: Diagnosis not present

## 2017-04-07 ENCOUNTER — Telehealth: Payer: Self-pay | Admitting: Gastroenterology

## 2017-04-07 DIAGNOSIS — E113511 Type 2 diabetes mellitus with proliferative diabetic retinopathy with macular edema, right eye: Secondary | ICD-10-CM | POA: Diagnosis not present

## 2017-04-07 DIAGNOSIS — H25811 Combined forms of age-related cataract, right eye: Secondary | ICD-10-CM | POA: Diagnosis not present

## 2017-04-07 DIAGNOSIS — H40041 Steroid responder, right eye: Secondary | ICD-10-CM | POA: Diagnosis not present

## 2017-04-07 DIAGNOSIS — E113412 Type 2 diabetes mellitus with severe nonproliferative diabetic retinopathy with macular edema, left eye: Secondary | ICD-10-CM | POA: Diagnosis not present

## 2017-04-18 ENCOUNTER — Encounter: Payer: Self-pay | Admitting: Gastroenterology

## 2017-04-18 NOTE — Telephone Encounter (Signed)
Dr.Jacobs' reviewed records and accepted for patient to be scheduled for an office visit. 'NAI to determine his care needs". Patient has been scheduled for next available office visit.

## 2017-04-21 ENCOUNTER — Telehealth: Payer: Self-pay | Admitting: Cardiology

## 2017-04-21 DIAGNOSIS — Z125 Encounter for screening for malignant neoplasm of prostate: Secondary | ICD-10-CM | POA: Diagnosis not present

## 2017-04-21 DIAGNOSIS — I10 Essential (primary) hypertension: Secondary | ICD-10-CM | POA: Diagnosis not present

## 2017-04-21 DIAGNOSIS — M5431 Sciatica, right side: Secondary | ICD-10-CM | POA: Diagnosis not present

## 2017-04-21 DIAGNOSIS — Z1211 Encounter for screening for malignant neoplasm of colon: Secondary | ICD-10-CM | POA: Diagnosis not present

## 2017-04-21 DIAGNOSIS — I129 Hypertensive chronic kidney disease with stage 1 through stage 4 chronic kidney disease, or unspecified chronic kidney disease: Secondary | ICD-10-CM | POA: Diagnosis not present

## 2017-04-21 DIAGNOSIS — E1140 Type 2 diabetes mellitus with diabetic neuropathy, unspecified: Secondary | ICD-10-CM | POA: Diagnosis not present

## 2017-04-21 DIAGNOSIS — N2581 Secondary hyperparathyroidism of renal origin: Secondary | ICD-10-CM | POA: Diagnosis not present

## 2017-04-21 DIAGNOSIS — Z7682 Awaiting organ transplant status: Secondary | ICD-10-CM | POA: Diagnosis not present

## 2017-04-21 DIAGNOSIS — N184 Chronic kidney disease, stage 4 (severe): Secondary | ICD-10-CM | POA: Diagnosis not present

## 2017-04-21 NOTE — Telephone Encounter (Signed)
New message   Offered patient appt with APP he refused wants to wait for Dr Ellyn Hack   Pt c/o of Chest Pain: STAT if CP now or developed within 24 hours  1. Are you having CP right now? Yes   2. Are you experiencing any other symptoms (ex. SOB, nausea, vomiting, sweating)?  No   3. How long have you been experiencing CP? Couple days  4. Is your CP continuous or coming and going? Comes and goes   5. Have you taken Nitroglycerin? no ?

## 2017-04-22 DIAGNOSIS — H40041 Steroid responder, right eye: Secondary | ICD-10-CM | POA: Diagnosis not present

## 2017-04-22 DIAGNOSIS — E113292 Type 2 diabetes mellitus with mild nonproliferative diabetic retinopathy without macular edema, left eye: Secondary | ICD-10-CM | POA: Diagnosis not present

## 2017-04-22 DIAGNOSIS — E113591 Type 2 diabetes mellitus with proliferative diabetic retinopathy without macular edema, right eye: Secondary | ICD-10-CM | POA: Diagnosis not present

## 2017-04-22 DIAGNOSIS — I10 Essential (primary) hypertension: Secondary | ICD-10-CM | POA: Diagnosis not present

## 2017-04-25 NOTE — Telephone Encounter (Signed)
SPOKE TO PATIENT . APPOINTMENT RESCHEDULE FOR 05/03/17 AT 3:40 PM. PATIENT VERBALIZED UNDERSTADNING

## 2017-05-03 ENCOUNTER — Ambulatory Visit: Payer: BLUE CROSS/BLUE SHIELD | Admitting: Cardiology

## 2017-05-10 DIAGNOSIS — Z01818 Encounter for other preprocedural examination: Secondary | ICD-10-CM | POA: Diagnosis not present

## 2017-05-10 DIAGNOSIS — H2511 Age-related nuclear cataract, right eye: Secondary | ICD-10-CM | POA: Diagnosis not present

## 2017-05-25 ENCOUNTER — Ambulatory Visit: Payer: BLUE CROSS/BLUE SHIELD | Admitting: Cardiology

## 2017-06-06 ENCOUNTER — Encounter (INDEPENDENT_AMBULATORY_CARE_PROVIDER_SITE_OTHER): Payer: Self-pay

## 2017-06-06 ENCOUNTER — Encounter

## 2017-06-06 ENCOUNTER — Telehealth: Payer: Self-pay | Admitting: Gastroenterology

## 2017-06-06 ENCOUNTER — Telehealth: Payer: Self-pay

## 2017-06-06 ENCOUNTER — Ambulatory Visit (INDEPENDENT_AMBULATORY_CARE_PROVIDER_SITE_OTHER): Payer: BLUE CROSS/BLUE SHIELD | Admitting: Gastroenterology

## 2017-06-06 ENCOUNTER — Other Ambulatory Visit: Payer: Self-pay | Admitting: Gastroenterology

## 2017-06-06 ENCOUNTER — Other Ambulatory Visit (INDEPENDENT_AMBULATORY_CARE_PROVIDER_SITE_OTHER): Payer: Self-pay

## 2017-06-06 ENCOUNTER — Encounter: Payer: Self-pay | Admitting: Gastroenterology

## 2017-06-06 VITALS — BP 132/76 | HR 74 | Ht 68.0 in | Wt 198.4 lb

## 2017-06-06 DIAGNOSIS — Z1211 Encounter for screening for malignant neoplasm of colon: Secondary | ICD-10-CM

## 2017-06-06 LAB — CBC WITH DIFFERENTIAL/PLATELET
Basophils Absolute: 0.1 10*3/uL (ref 0.0–0.1)
Basophils Relative: 0.8 % (ref 0.0–3.0)
Eosinophils Absolute: 0.5 10*3/uL (ref 0.0–0.7)
Eosinophils Relative: 6.4 % — ABNORMAL HIGH (ref 0.0–5.0)
HCT: 37 % — ABNORMAL LOW (ref 39.0–52.0)
Hemoglobin: 12.6 g/dL — ABNORMAL LOW (ref 13.0–17.0)
Lymphocytes Relative: 17.7 % (ref 12.0–46.0)
Lymphs Abs: 1.4 10*3/uL (ref 0.7–4.0)
MCHC: 34.1 g/dL (ref 30.0–36.0)
MCV: 76.8 fl — ABNORMAL LOW (ref 78.0–100.0)
Monocytes Absolute: 0.9 10*3/uL (ref 0.1–1.0)
Monocytes Relative: 11.2 % (ref 3.0–12.0)
Neutro Abs: 5 10*3/uL (ref 1.4–7.7)
Neutrophils Relative %: 63.9 % (ref 43.0–77.0)
Platelets: 156 10*3/uL (ref 150.0–400.0)
RBC: 4.81 Mil/uL (ref 4.22–5.81)
RDW: 14.7 % (ref 11.5–15.5)
WBC: 7.8 10*3/uL (ref 4.0–10.5)

## 2017-06-06 LAB — BASIC METABOLIC PANEL
BUN: 56 mg/dL — ABNORMAL HIGH (ref 6–23)
CO2: 20 mEq/L (ref 19–32)
Calcium: 8.5 mg/dL (ref 8.4–10.5)
Chloride: 106 mEq/L (ref 96–112)
Creatinine, Ser: 5.1 mg/dL (ref 0.40–1.50)
GFR: 14.86 mL/min — CL (ref >60.00)
Glucose, Bld: 140 mg/dL — ABNORMAL HIGH (ref 70–99)
Potassium: 4.4 mEq/L (ref 3.5–5.1)
Sodium: 136 mEq/L (ref 135–145)

## 2017-06-06 MED ORDER — PEG 3350-KCL-NA BICARB-NACL 420 G PO SOLR: 4000.0000 mL | ORAL | 0 refills | Status: DC

## 2017-06-06 NOTE — Telephone Encounter (Signed)
Patient with diagnosis of atrial fibrillation on Eliquis for anticoagulation.    Procedure: colonoscopy Date of procedure: 07/18/17  CHADS2-VASc score of  3 (CHF, HTN, , DM2, )  CrCl 19.4 Platelet count 156  Per office protocol, patient can hold Eliquis for 2 days prior to procedure.

## 2017-06-06 NOTE — Telephone Encounter (Signed)
Normal for him.  thanks

## 2017-06-06 NOTE — Telephone Encounter (Signed)
Lab called with critical Creat 5.1  GFR 14.86

## 2017-06-06 NOTE — Progress Notes (Signed)
HPI: This is a very pleasant 62 year old man who was referred to me by Nolene Ebbs, MD  to evaluate colon cancer screening.    Chief complaint is routine risk for colon cancer  Colonoscopy about 10 years, in [place].  Was normal per patient.  He does not remember the name of the doctor that did this.  He is heading towards dialysis, recently had a left arm fistula placed.  He has not needed dialysis yet.  Transplant evaluation going through the [place] system.  He is on eliquis for atrial fibrillation.  Dr. Ellyn Hack.   He has not had any trouble with his bowels, no bleeding, no changes in his stools, no significant constipation or diarrhea .  No family history of colon cancer    Review of systems: Pertinent positive and negative review of systems were noted in the above HPI section. All other review negative.   Past Medical History:  Diagnosis Date  . Chronic diastolic heart failure (HCC)    a. in the setting of a-fib --> Nonobstructive cath 09/06/2013 30% LAD.   2-D ECHO: Normal LV size and thickness. Mild LA dilatation. EF ~65%, no regional WMA  . CKD stage 3 due to type 2 diabetes mellitus (HCC)    Cr ~1.9  . Degenerative cervical disc   . Degenerative disc disease   . Essential hypertension   . Kidney disease   . PAF (paroxysmal atrial fibrillation) ([place])   . Type 2 diabetes mellitus with hypercholesterolemia [facility])     Past Surgical History:  Procedure Laterality Date  . ANTERIOR CERVICAL DECOMP/DISCECTOMY FUSION  12/17/2011   Procedure: ANTERIOR CERVICAL DECOMPRESSION/DISCECTOMY FUSION 1 LEVEL;  Surgeon: Erline Levine, MD;  Location: [facility];  Service: Neurosurgery;  Laterality: N/A;  Exploration of Fusion and Anterior Cervical Six-Seven Decompression/Diskectomy/Fusion  . BOWEL RESECTION    . CHOLECYSTECTOMY    . HERNIA REPAIR     YEARS AGO  . LEFT HEART CATHETERIZATION WITH CORONARY ANGIOGRAM N/A 09/06/2013   Procedure: LEFT HEART CATHETERIZATION WITH CORONARY  ANGIOGRAM;  Surgeon: Leonie Man, MD;  Location: [facility] C[facility] LAB;  Service: Cardiovascular: 30% LAD. Otherwise nonobstructive CAD  . NECK SURGERY    . POSTERIOR CERVICAL FUSION/FORAMINOTOMY  12/17/2011   Procedure: POSTERIOR CERVICAL FUSION/FORAMINOTOMY LEVEL 4;  Surgeon: Erline Levine, MD;  Location: [facility];  Service: Neurosurgery;;  Posterior Cervical Three-Seven Fusion  . TONSILLECTOMY    . TRANSTHORACIC ECHOCARDIOGRAM  08/2013    Moderate LVH. EF 65%. Mild LA dilation. Normal RV size. Structurally normal valves.    Current Outpatient Medications  Medication Sig Dispense Refill  . acetaminophen (TYLENOL) 500 MG tablet Take 1,000 mg by mouth every 6 (six) hours as needed for moderate pain.    [name] amLODipine (NORVASC) 5 MG tablet Take 5 mg by mouth daily.    [name] apixaban (ELIQUIS) 5 MG TABS tablet Take 1 tablet (5 mg total) by mouth 2 (two) times daily. 60 tablet 12  . carvedilol (COREG) 25 MG tablet Take 25 mg by mouth 2 (two) times daily with a meal.    . Cholecalciferol (VITAMIN D PO) Take 1 tablet by mouth daily.    [name] doxazosin (CARDURA) 4 MG tablet Take 4 mg by mouth at bedtime.    [name] glimepiride (AMARYL) 4 MG tablet Take 4 mg by mouth daily.     No current facility-administered medications for this visit.     Allergies as of 06/06/2017 - Review Complete 06/06/2017  Allergen Reaction Noted  .  Penicillins Hives, Itching, and Other (See Comments) 04/11/2011    Family History  Problem Relation Age of Onset  . Heart failure Mother   . Diabetes Mellitus II Mother   . Diabetes Mellitus II Sister     Social History   Socioeconomic History  . Marital status: Married    Spouse name: Not on file  . Number of children: Not on file  . Years of education: Not on file  . Highest education level: Not on file  Occupational History  . Not on file  Social Needs  . Financial resource strain: Not on file  . Food insecurity:    Worry: Not on file    Inability: Not on file  .  Transportation needs:    Medical: Not on file    Non-medical: Not on file  Tobacco Use  . Smoking status: Never Smoker  . Smokeless tobacco: Never Used  Substance and Sexual Activity  . Alcohol use: No  . Drug use: No  . Sexual activity: Never  Lifestyle  . Physical activity:    Days per week: Not on file    Minutes per session: Not on file  . Stress: Not on file  Relationships  . Social connections:    Talks on phone: Not on file    Gets together: Not on file    Attends religious service: Not on file    Active member of club or organization: Not on file    Attends meetings of clubs or organizations: Not on file    Relationship status: Not on file  . Intimate partner violence:    Fear of current or ex partner: Not on file    Emotionally abused: Not on file    Physically abused: Not on file    Forced sexual activity: Not on file  Other Topics Concern  . Not on file  Social History Narrative  . Not on file     Physical Exam: BP 132/76   Pulse 74   Ht 5\' 8"  (1.727 m)   Wt 198 lb 6 oz (90 kg)   BMI 30.16 kg/m  Constitutional: generally well-appearing Psychiatric: alert and oriented x3 Eyes: extraocular movements intact Mouth: oral pharynx moist, no lesions Neck: supple no lymphadenopathy Cardiovascular: heart regular rate and rhythm Lungs: clear to auscultation bilaterally Abdomen: soft, nontender, nondistended, no obvious ascites, no peritoneal signs, normal bowel sounds Extremities: no lower extremity edema bilaterally Skin: no lesions on visible extremities   Assessment and plan: 62 y.o. male with routine risk for colon cancer  I recommended that we proceed with colonoscopy at his soonest convenience.  He is on a blood thinner for atrial fibrillation and he understands that that will increase the risk of procedure related complications and so we recommend that he not take that medicine for 1 day prior to the colonoscopy.  We will make sure that his cardiologist  is okay with that recommendation.    Please see the "Patient Instructions" section for addition details about the plan.   Owens Loffler, MD [facility] 06/06/2017, 8:38 AM  Cc: Nolene Ebbs, MD

## 2017-06-06 NOTE — Telephone Encounter (Signed)
Routing to pharmacy.  Lylie Blacklock C. Josey Forcier, RN, ANP-C [facility] [address] [place], [place]  [address] [phone]  

## 2017-06-06 NOTE — Patient Instructions (Addendum)
You will be set up for a colonoscopy at [facility] with MAC sedation for colon cancer screening.  We will contact Dr. Ellyn Hack about holding your eliquis 24 hours prior to the colonoscopy. You will have labs checked today in the basement lab.  Please head down after you check out with the front desk  (cbc, bmet). Normal BMI (Body Mass Index- based on height and weight) is between 19 and 25. Your BMI today is Body mass index is 30.16 kg/m. [name] Please consider follow up  regarding your BMI with your Primary Care Provider.

## 2017-06-06 NOTE — Telephone Encounter (Signed)
[facility] Pre-operative Risk Assessment     Request for surgical clearance:     Endoscopy Procedure  What type of surgery is being performed?     colonoscopy  When is this surgery scheduled?    07/18/17  What type of clearance is required ?   Pharmacy  Are there any medications that need to be held prior to surgery and how long? [facility] Dr Ardis Hughs  What is your office phone and fax number?      Phone- [phone]  [phone]  Anesthesia type (None, local, MAC, general) ?       MAC

## 2017-06-07 NOTE — Telephone Encounter (Signed)
Spoke to patient. He was informed to hold  Eliquis for 2 days prior to his procedure per his medical doctors office protocol. Patient voiced understanding

## 2017-06-28 IMAGING — US US RENAL
1 series · 14 of 25 positions shown · non-contrast
Comparison: Ultrasound of the abdomen of 09/05/2013

CLINICAL DATA: Acute renal insufficiency, hypertension, diabetes

EXAM:
RENAL / URINARY TRACT ULTRASOUND COMPLETE

[Series 1: us renal · 0.23mm/px · 14 of 38 slices shown]
[im 1/38]
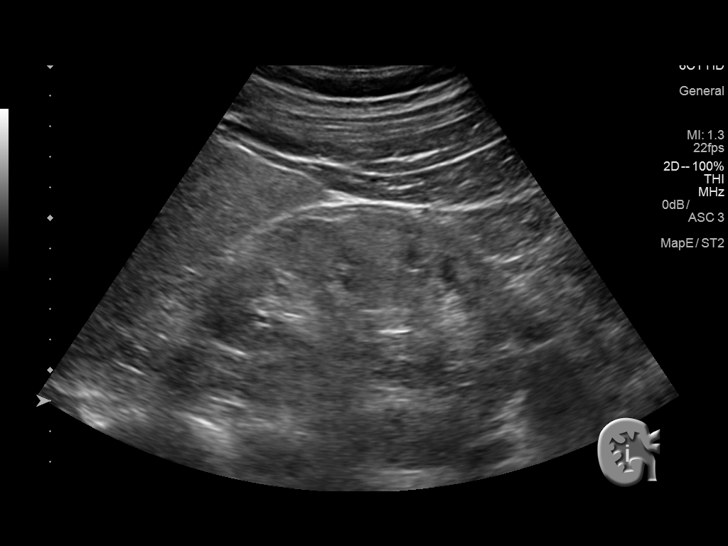
[im 4/38]
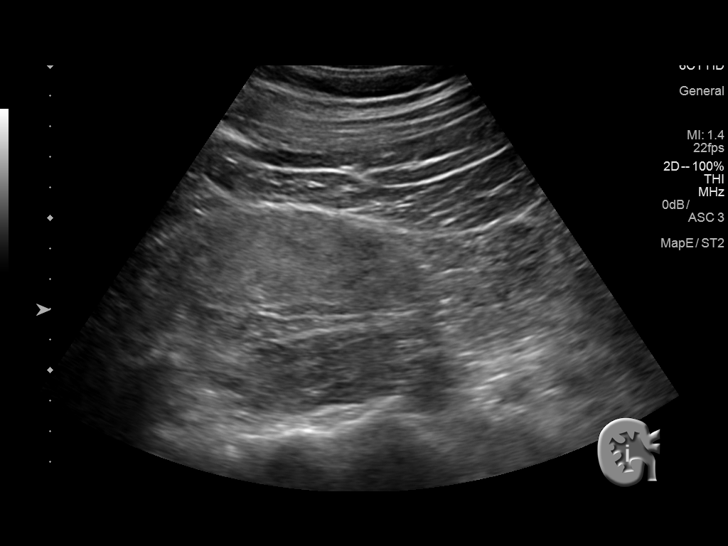
[im 7/38]
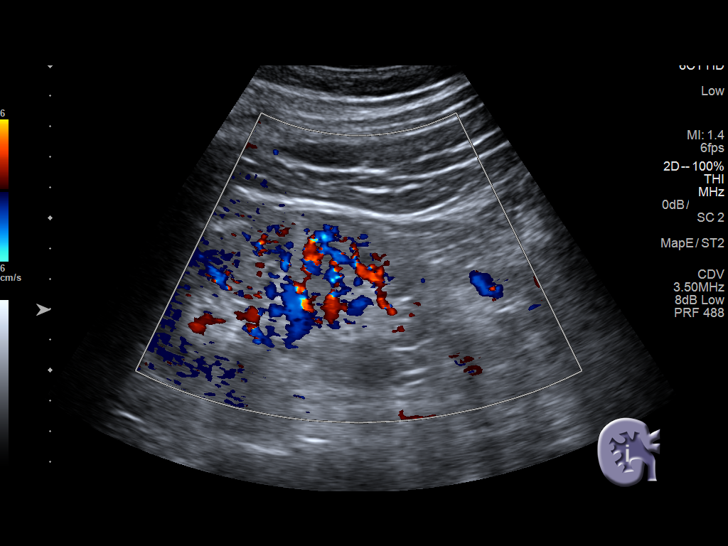
[im 10/38]
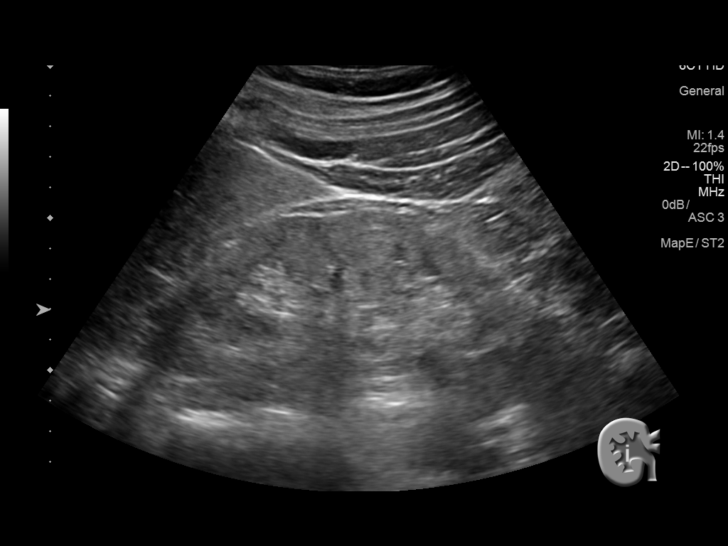
[im 13/38]
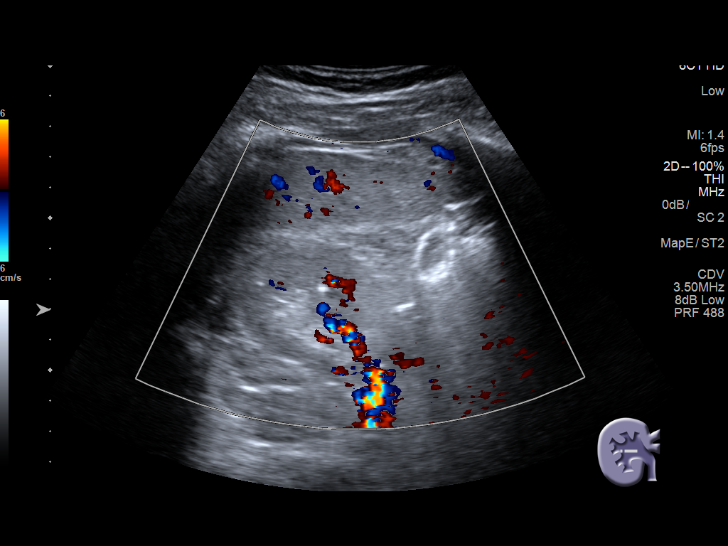
[im 14/38]
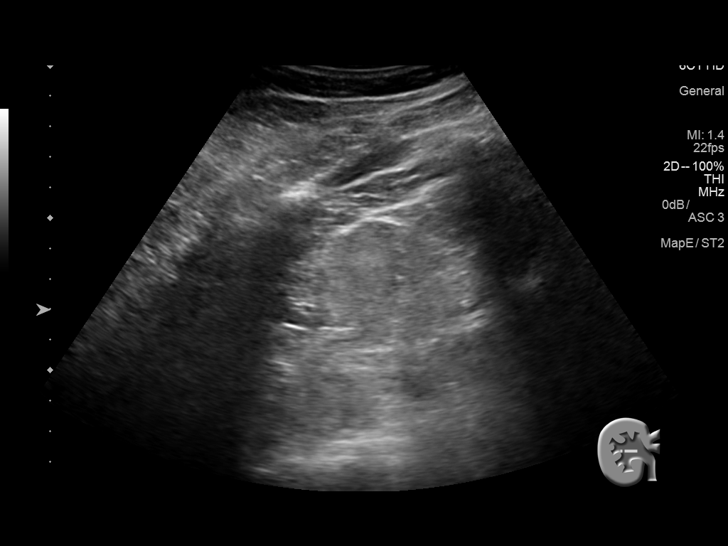
[im 17/38]
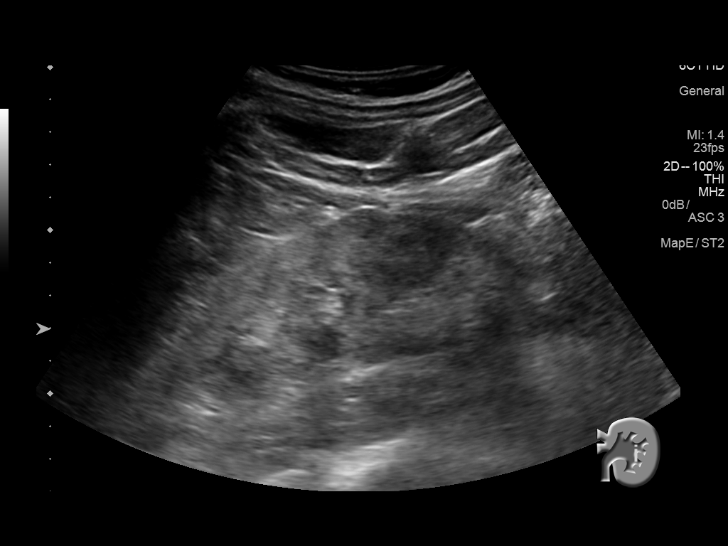
[im 21/38]
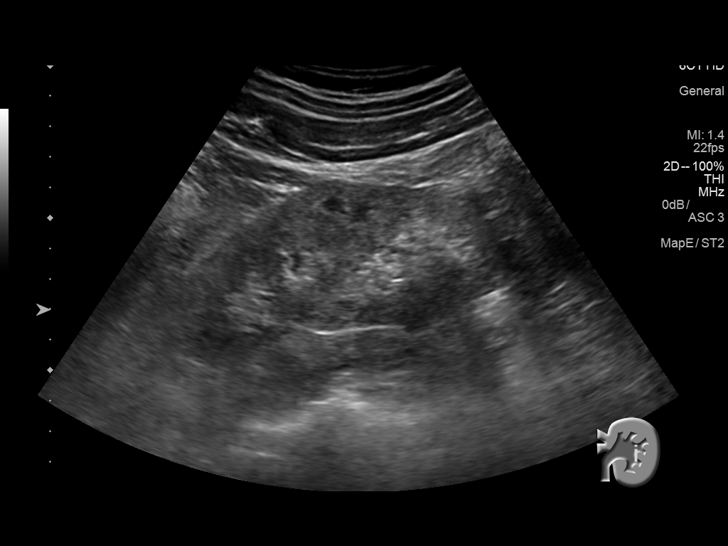
[im 24/38]
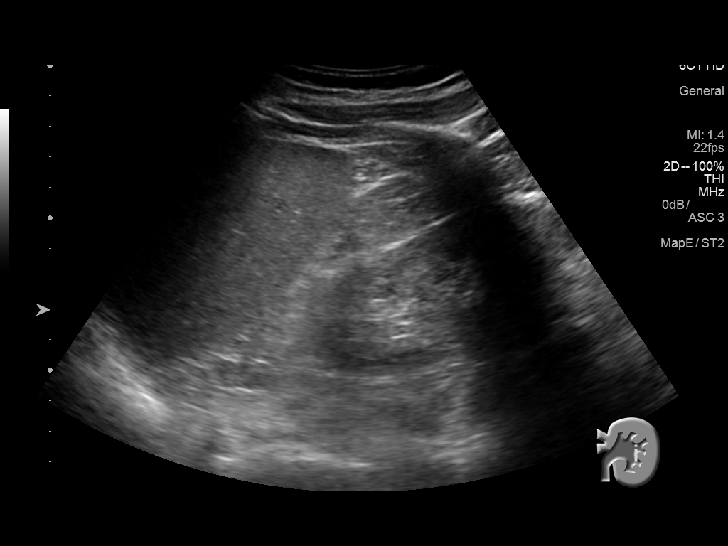
[im 25/38]
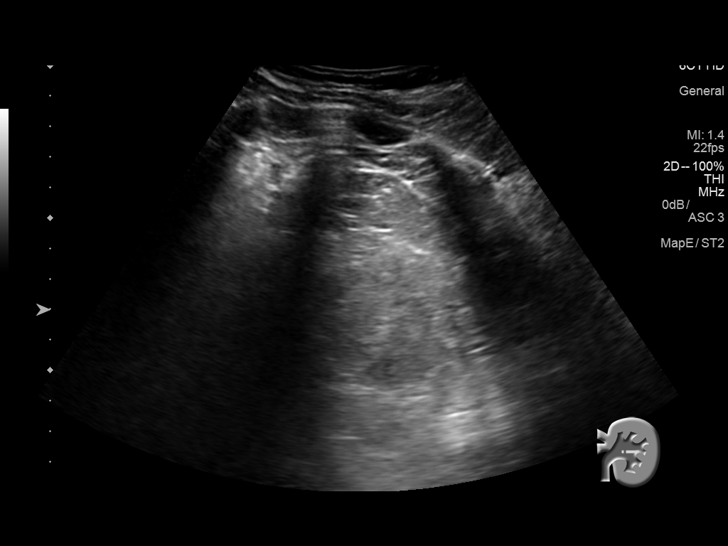
[im 28/38]
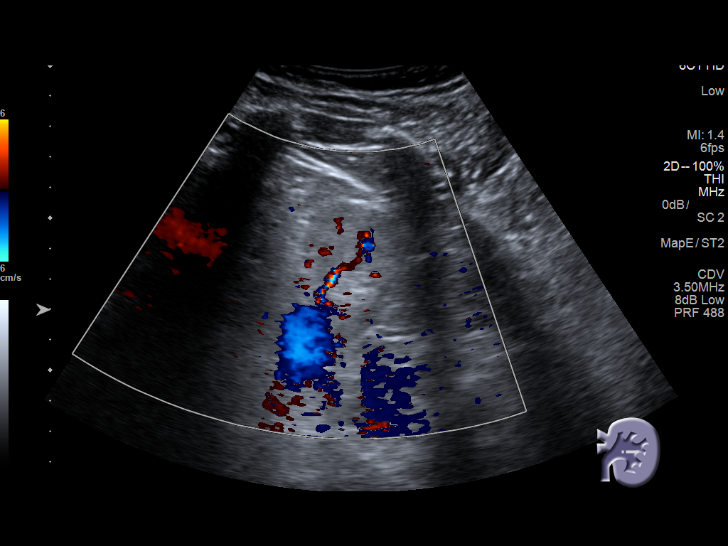
[im 31/38]
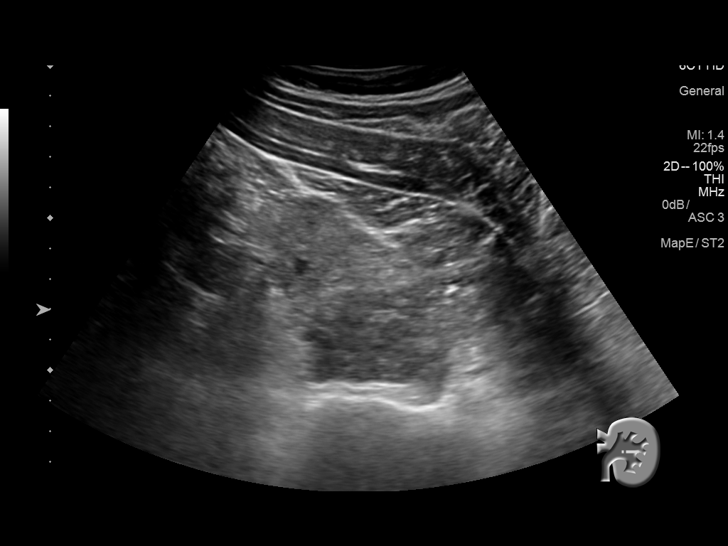
[im 34/38]
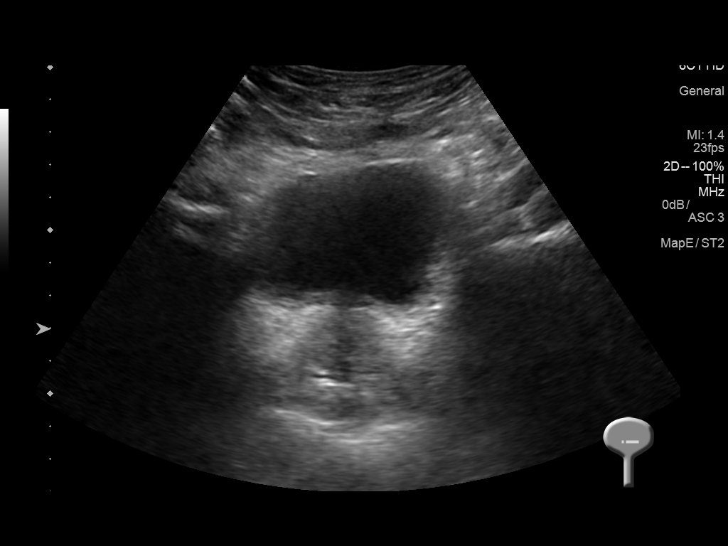
[im 38/38]
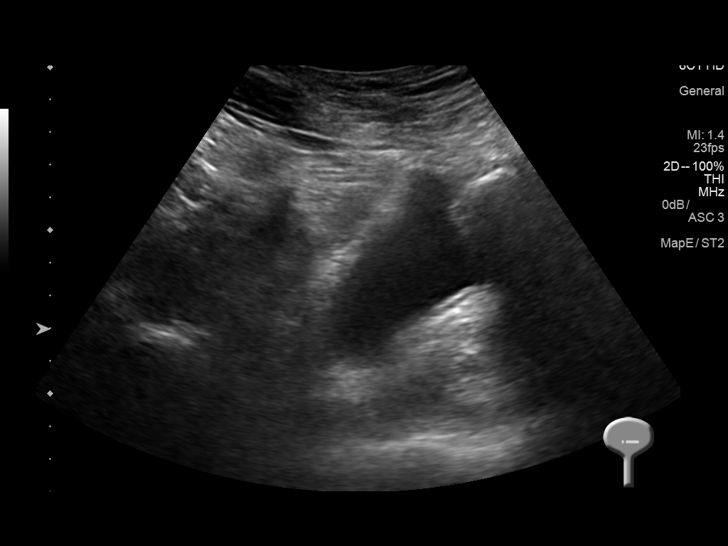

[14 of 25 positions shown; findings below may reference images not displayed]

FINDINGS: Right Kidney:

Length: 10.9 cm compared prior measurement of 11.6 cm in 9632. The
echogenicity of the parenchyma is increased consistent with chronic
renal medical disease. No hydronephrosis is seen.

Left Kidney:

Length: 11.5 cm compared to 12.7 cm previously.. No hydronephrosis
is noted. The echogenicity is diffusely increased.

Bladder:

The urinary bladder is not well distended but is grossly
unremarkable.
IMPRESSION: 1. No hydronephrosis.
2. Diffusely echogenic renal parenchyma bilaterally consistent with
chronic renal medical disease.

## 2017-07-18 ENCOUNTER — Encounter: Payer: Self-pay | Admitting: Gastroenterology

## 2017-07-18 ENCOUNTER — Ambulatory Visit (AMBULATORY_SURGERY_CENTER): Payer: Self-pay | Admitting: Gastroenterology

## 2017-07-18 ENCOUNTER — Other Ambulatory Visit: Payer: Self-pay

## 2017-07-18 VITALS — BP 169/86 | HR 83 | Temp 99.1°F | Resp 12 | Ht 68.0 in | Wt 198.0 lb

## 2017-07-18 DIAGNOSIS — Z1211 Encounter for screening for malignant neoplasm of colon: Secondary | ICD-10-CM

## 2017-07-18 DIAGNOSIS — D124 Benign neoplasm of descending colon: Secondary | ICD-10-CM

## 2017-07-18 MED ORDER — SODIUM CHLORIDE 0.9 % IV SOLN: 500.0000 mL | Freq: Once | INTRAVENOUS | Status: AC

## 2017-07-18 NOTE — Patient Instructions (Signed)
Start Eliquis tomorrow per Dr Ardis Hughs. Handout given on polyps.  YOU HAD AN ENDOSCOPIC PROCEDURE TODAY AT [facility]:   Refer to the procedure report that was given to you for any specific questions about what was found during the examination.  If the procedure report does not answer your questions, please call your gastroenterologist to clarify.  If you requested that your care partner not be given the details of your procedure findings, then the procedure report has been included in a sealed envelope for you to review at your convenience later.  YOU SHOULD EXPECT: Some feelings of bloating in the abdomen. Passage of more gas than usual.  Walking can help get rid of the air that was put into your GI tract during the procedure and reduce the bloating. If you had a lower endoscopy (such as a colonoscopy or flexible sigmoidoscopy) you may notice spotting of blood in your stool or on the toilet paper. If you underwent a bowel prep for your procedure, you may not have a normal bowel movement for a few days.  Please Note:  You might notice some irritation and congestion in your nose or some drainage.  This is from the oxygen used during your procedure.  There is no need for concern and it should clear up in a day or so.  SYMPTOMS TO REPORT IMMEDIATELY:   Following lower endoscopy (colonoscopy or flexible sigmoidoscopy):  Excessive amounts of blood in the stool  Significant tenderness or worsening of abdominal pains  Swelling of the abdomen that is new, acute  Fever of 100F or higher   Following upper endoscopy (EGD)  Vomiting of blood or coffee ground material  New chest pain or pain under the shoulder blades  Painful or persistently difficult swallowing  New shortness of breath  Fever of 100F or higher  Black, tarry-looking stools  For urgent or emergent issues, a gastroenterologist can be reached at any hour by calling [phone].   DIET:  We do recommend a small  meal at first, but then you may proceed to your regular diet.  Drink plenty of fluids but you should avoid alcoholic beverages for 24 hours.  ACTIVITY:  You should plan to take it easy for the rest of today and you should NOT DRIVE or use heavy machinery until tomorrow (because of the sedation medicines used during the test).    FOLLOW UP: Our staff will call the number listed on your records the next business day following your procedure to check on you and address any questions or concerns that you may have regarding the information given to you following your procedure. If we do not reach you, we will leave a message.  However, if you are feeling well and you are not experiencing any problems, there is no need to return our call.  We will assume that you have returned to your regular daily activities without incident.  If any biopsies were taken you will be contacted by phone or by letter within the next 1-3 weeks.  Please call us at [phone] if you have not heard about the biopsies in 3 weeks.    SIGNATURES/CONFIDENTIALITY: You and/or your care partner have signed paperwork which will be entered into your electronic medical record.  These signatures attest to the fact that that the information above on your After Visit Summary has been reviewed and is understood.  Full responsibility of the confidentiality of this discharge information lies with you and/or your care-partner.

## 2017-07-18 NOTE — Progress Notes (Signed)
Called to room to assist during endoscopic procedure.  Patient ID and intended procedure confirmed with present staff. Received instructions for my participation in the procedure from the performing physician.  

## 2017-07-18 NOTE — Op Note (Signed)
[place] Patient Name: Barry Lopez Procedure Date: 07/18/2017 3:09 PM MRN: [id number] Endoscopist: Milus Banister , MD Age: 62 Referring MD:  Date of Birth: 1955/09/02 Gender: Male Account #: [id number] Procedure:                Colonoscopy Indications:              Screening for colorectal malignant neoplasm Medicines:                Monitored Anesthesia Care Procedure:                Pre-Anesthesia Assessment:                           - Prior to the procedure, a History and Physical                            was performed, and patient medications and                            allergies were reviewed. The patient's tolerance of                            previous anesthesia was also reviewed. The risks                            and benefits of the procedure and the sedation                            options and risks were discussed with the patient.                            All questions were answered, and informed consent                            was obtained. Prior Anticoagulants: The patient has                            taken Eliquis (apixaban), last dose was 2 days                            prior to procedure. ASA Grade Assessment: III - A                            patient with severe systemic disease. After                            reviewing the risks and benefits, the patient was                            deemed in satisfactory condition to undergo the                            procedure.  After obtaining informed consent, the colonoscope                            was passed under direct vision. Throughout the                            procedure, the patient's blood pressure, pulse, and                            oxygen saturations were monitored continuously. The                            Model CF-HQ190L [id number]) scope was introduced                            through the anus and advanced to the the cecum,                        identified by appendiceal orifice and ileocecal                            valve. The colonoscopy was performed without                            difficulty. The patient tolerated the procedure                            well. The quality of the bowel preparation was                            good. The ileocecal valve, appendiceal orifice, and                            rectum were photographed. Scope In: 3:10:24 PM Scope Out: 3:22:03 PM Scope Withdrawal Time: 0 hours 9 minutes 42 seconds  Total Procedure Duration: 0 hours 11 minutes 39 seconds  Findings:                 A 2 mm polyp was found in the descending colon. The                            polyp was sessile. The polyp was removed with a                            cold snare. Resection and retrieval were complete.                           The exam was otherwise without abnormality on                            direct and retroflexion views. Complications:            No immediate complications. Estimated blood loss:  None. Estimated Blood Loss:     Estimated blood loss: none. Impression:               - One 2 mm polyp in the descending colon, removed                            with a cold snare. Resected and retrieved.                           - The examination was otherwise normal on direct                            and retroflexion views. Recommendation:           - Patient has a contact number available for                            emergencies. The signs and symptoms of potential                            delayed complications were discussed with the                            patient. Return to normal activities tomorrow.                            Written discharge instructions were provided to the                            patient.                           - Resume previous diet.                           - Continue present medications.                           You  will receive a letter within 2-3 weeks with the                            pathology results and my final recommendations.                           If the polyp(s) is proven to be 'pre-cancerous' on                            pathology, you will need repeat colonoscopy in 5                            years. If the polyp(s) is NOT 'precancerous' on                            pathology then you should repeat colon cancer  screening in 10 years with colonoscopy without need                            for colon cancer screening by any method prior to                            then (including stool testing). Milus Banister, MD 07/18/2017 3:25:58 PM This report has been signed electronically.

## 2017-07-18 NOTE — Progress Notes (Signed)
Report given to PACU, vss 

## 2017-07-19 ENCOUNTER — Telehealth: Payer: Self-pay | Admitting: *Deleted

## 2017-07-19 NOTE — Telephone Encounter (Signed)
  Follow up Call-  Call back number 07/18/2017  Post procedure Call Back phone  # [phone]  Permission to leave phone message Yes  Some recent data might be hidden     Patient questions:  Do you have a fever, pain , or abdominal swelling? No. Pain Score  0 *  Have you tolerated food without any problems? Yes.    Have you been able to return to your normal activities? Yes.    Do you have any questions about your discharge instructions: Diet   No. Medications  No. Follow up visit  No.  Do you have questions or concerns about your Care? No.  Actions: * If pain score is 4 or above: No action needed, pain <4.

## 2017-07-26 ENCOUNTER — Encounter: Payer: Self-pay | Admitting: Gastroenterology

## 2018-04-05 ENCOUNTER — Encounter (HOSPITAL_COMMUNITY): Payer: Self-pay

## 2018-04-05 ENCOUNTER — Emergency Department (HOSPITAL_COMMUNITY)
Admission: EM | Admit: 2018-04-05 | Discharge: 2018-04-05 | Disposition: A | Discharge status: Home / Self Care | Payer: Non-veteran care | Attending: Emergency Medicine | Admitting: Emergency Medicine

## 2018-04-05 ENCOUNTER — Emergency Department (HOSPITAL_COMMUNITY): Payer: Non-veteran care

## 2018-04-05 DIAGNOSIS — I130 Hypertensive heart and chronic kidney disease with heart failure and stage 1 through stage 4 chronic kidney disease, or unspecified chronic kidney disease: Secondary | ICD-10-CM | POA: Insufficient documentation

## 2018-04-05 DIAGNOSIS — Z79899 Other long term (current) drug therapy: Secondary | ICD-10-CM | POA: Diagnosis not present

## 2018-04-05 DIAGNOSIS — R69 Illness, unspecified: Secondary | ICD-10-CM

## 2018-04-05 DIAGNOSIS — E7800 Pure hypercholesterolemia, unspecified: Secondary | ICD-10-CM | POA: Diagnosis not present

## 2018-04-05 DIAGNOSIS — E1122 Type 2 diabetes mellitus with diabetic chronic kidney disease: Secondary | ICD-10-CM | POA: Insufficient documentation

## 2018-04-05 DIAGNOSIS — E1169 Type 2 diabetes mellitus with other specified complication: Secondary | ICD-10-CM | POA: Diagnosis not present

## 2018-04-05 DIAGNOSIS — I5032 Chronic diastolic (congestive) heart failure: Secondary | ICD-10-CM | POA: Insufficient documentation

## 2018-04-05 DIAGNOSIS — J101 Influenza due to other identified influenza virus with other respiratory manifestations: Secondary | ICD-10-CM | POA: Insufficient documentation

## 2018-04-05 DIAGNOSIS — N183 Chronic kidney disease, stage 3 (moderate): Secondary | ICD-10-CM | POA: Diagnosis not present

## 2018-04-05 DIAGNOSIS — R509 Fever, unspecified: Secondary | ICD-10-CM | POA: Diagnosis present

## 2018-04-05 DIAGNOSIS — E161 Other hypoglycemia: Secondary | ICD-10-CM | POA: Diagnosis not present

## 2018-04-05 DIAGNOSIS — J111 Influenza due to unidentified influenza virus with other respiratory manifestations: Secondary | ICD-10-CM

## 2018-04-05 DIAGNOSIS — I10 Essential (primary) hypertension: Secondary | ICD-10-CM | POA: Diagnosis not present

## 2018-04-05 DIAGNOSIS — E162 Hypoglycemia, unspecified: Secondary | ICD-10-CM | POA: Diagnosis not present

## 2018-04-05 LAB — URINALYSIS, ROUTINE W REFLEX MICROSCOPIC
Bilirubin Urine: NEGATIVE
Glucose, UA: NEGATIVE mg/dL
Ketones, ur: NEGATIVE mg/dL
Leukocytes,Ua: NEGATIVE
Nitrite: NEGATIVE
Protein, ur: 100 mg/dL — AB
Specific Gravity, Urine: 1.016 (ref 1.005–1.030)
pH: 8 (ref 5.0–8.0)

## 2018-04-05 LAB — INFLUENZA PANEL BY PCR (TYPE A & B)
Influenza A By PCR: POSITIVE — AB
Influenza B By PCR: NEGATIVE

## 2018-04-05 LAB — COMPREHENSIVE METABOLIC PANEL
ALT: 24 U/L (ref 0–44)
AST: 31 U/L (ref 15–41)
Albumin: 3.8 g/dL (ref 3.5–5.0)
Alkaline Phosphatase: 65 U/L (ref 38–126)
Anion gap: 12 (ref 5–15)
BUN: 18 mg/dL (ref 8–23)
CO2: 29 mmol/L (ref 22–32)
Calcium: 8.8 mg/dL — ABNORMAL LOW (ref 8.9–10.3)
Chloride: 92 mmol/L — ABNORMAL LOW (ref 98–111)
Creatinine, Ser: 4.64 mg/dL — ABNORMAL HIGH (ref 0.61–1.24)
GFR calc Af Amer: 15 mL/min — ABNORMAL LOW (ref >60)
GFR calc non Af Amer: 13 mL/min — ABNORMAL LOW (ref >60)
Glucose, Bld: 114 mg/dL — ABNORMAL HIGH (ref 70–99)
Potassium: 3.4 mmol/L — ABNORMAL LOW (ref 3.5–5.1)
Sodium: 133 mmol/L — ABNORMAL LOW (ref 135–145)
Total Bilirubin: 0.8 mg/dL (ref 0.3–1.2)
Total Protein: 8.1 g/dL (ref 6.5–8.1)

## 2018-04-05 LAB — CBC WITH DIFFERENTIAL/PLATELET
Abs Immature Granulocytes: 0.01 10*3/uL (ref 0.00–0.07)
Basophils Absolute: 0 10*3/uL (ref 0.0–0.1)
Basophils Relative: 1 %
Eosinophils Absolute: 0.1 10*3/uL (ref 0.0–0.5)
Eosinophils Relative: 3 %
HCT: 36 % — ABNORMAL LOW (ref 39.0–52.0)
Hemoglobin: 12 g/dL — ABNORMAL LOW (ref 13.0–17.0)
Immature Granulocytes: 0 %
Lymphocytes Relative: 7 %
Lymphs Abs: 0.3 10*3/uL — ABNORMAL LOW (ref 0.7–4.0)
MCH: 25.5 pg — ABNORMAL LOW (ref 26.0–34.0)
MCHC: 33.3 g/dL (ref 30.0–36.0)
MCV: 76.6 fL — ABNORMAL LOW (ref 80.0–100.0)
Monocytes Absolute: 1.1 10*3/uL — ABNORMAL HIGH (ref 0.1–1.0)
Monocytes Relative: 26 %
Neutro Abs: 2.7 10*3/uL (ref 1.7–7.7)
Neutrophils Relative %: 63 %
Platelets: 107 10*3/uL — ABNORMAL LOW (ref 150–400)
RBC: 4.7 MIL/uL (ref 4.22–5.81)
RDW: 15.1 % (ref 11.5–15.5)
WBC: 4.3 10*3/uL (ref 4.0–10.5)
nRBC: 0 % (ref 0.0–0.2)

## 2018-04-05 LAB — CBG MONITORING, ED
Glucose-Capillary: 63 mg/dL — ABNORMAL LOW (ref 70–99)
Glucose-Capillary: 120 mg/dL — ABNORMAL HIGH (ref 70–99)
Glucose-Capillary: 118 mg/dL — ABNORMAL HIGH (ref 70–99)

## 2018-04-05 LAB — LACTIC ACID, PLASMA: Lactic Acid, Venous: 2.1 mmol/L (ref 0.5–1.9)

## 2018-04-05 MED ORDER — OSELTAMIVIR PHOSPHATE 75 MG PO CAPS: 75.0000 mg | ORAL_CAPSULE | Freq: Two times a day (BID) | ORAL | 0 refills | Status: DC

## 2018-04-05 MED ORDER — OSELTAMIVIR PHOSPHATE 30 MG PO CAPS
30.0000 mg | ORAL_CAPSULE | Freq: Once | ORAL | Status: AC
  Administered 2018-04-05: 30 mg via ORAL
  Filled 2018-04-05: qty 1

## 2018-04-05 MED ORDER — ACETAMINOPHEN 500 MG PO TABS
1000.0000 mg | ORAL_TABLET | Freq: Once | ORAL | Status: AC
  Administered 2018-04-05: 1000 mg via ORAL
  Filled 2018-04-05: qty 2

## 2018-04-05 MED ORDER — OSELTAMIVIR PHOSPHATE 30 MG PO CAPS: ORAL_CAPSULE | ORAL | 0 refills | Status: AC

## 2018-04-05 NOTE — ED Notes (Signed)
Patient verbalizes understanding of discharge instructions. Opportunity for questioning and answers were provided. Armband removed by staff, pt discharged from ED. Pt ambulatory to lobby. Prescriptions/pharmacy reviewed.

## 2018-04-05 NOTE — ED Triage Notes (Signed)
Pt arrives via [place] from [place], pt had full dialysis treatment. After dialysis he had 103 temp and 192/80 BP. Pt endorses HA, lightheadedness and dizziness x2 days. Pt restricted on left side. Pt reports fever of 99.7 before dialysis.

## 2018-04-05 NOTE — ED Notes (Signed)
Patient transported to X-ray 

## 2018-04-05 NOTE — ED Notes (Signed)
EDP notified of lactic

## 2018-04-05 NOTE — Discharge Instructions (Addendum)
Take tylenol 2 pills 4 times a day.    Return for worsening shortness of breath, headache, confusion. Follow up with your family doctor.   

## 2018-04-05 NOTE — ED Provider Notes (Signed)
[facility] EMERGENCY DEPARTMENT Provider Note   CSN: [id number] Arrival date & time: 04/05/18  1658    History   Chief Complaint Chief Complaint  Patient presents with  . Hypertension  . Hypoglycemia    HPI Barry Lopez is a 63 y.o. male.     63 yo M with a chief complaint of fever.  Patient is felt bad for the past couple days.  Went to dialysis today and completed this session but felt worse and so came to the ED.  He took his oral hypoglycemics and has not had anything the eat today.  Has had some low blood sugar measurements.  He denies abdominal pain denies nausea vomiting or diarrhea.  Has mostly had cough and congestion.  Denies shortness of breath denies chest pain.  No known sick contacts.  Denies dysuria increased frequency hesitancy or flank pain.  The history is provided by the patient.  Hypertension  Pertinent negatives include no chest pain, no abdominal pain, no headaches and no shortness of breath.  Hypoglycemia  Associated symptoms: no shortness of breath, no tremors and no vomiting   Illness  Severity:  Moderate Onset quality:  Gradual Duration:  2 days Timing:  Constant Progression:  Worsening Chronicity:  New Associated symptoms: congestion, cough, fatigue, fever and rhinorrhea   Associated symptoms: no abdominal pain, no chest pain, no diarrhea, no headaches, no myalgias, no rash, no shortness of breath and no vomiting     Past Medical History:  Diagnosis Date  . Chronic diastolic heart failure (HCC)    a. in the setting of a-fib --> Nonobstructive cath 09/06/2013 30% LAD.   2-D ECHO: Normal LV size and thickness. Mild LA dilatation. EF ~65%, no regional WMA  . CKD stage 3 due to type 2 diabetes mellitus (HCC)    Cr ~1.9  . Degenerative cervical disc   . Degenerative disc disease   . Essential hypertension   . Kidney disease   . PAF (paroxysmal atrial fibrillation) ([place])   . Type 2 diabetes mellitus with hypercholesterolemia  [facility])     Patient Active Problem List   Diagnosis Date Noted  . Hypoglycemia   . Gastroenteritis 03/18/2016  . Abnormal finding on EKG 05/25/2015  . Type 2 diabetes mellitus with hypercholesterolemia ([place])   . PAF (paroxysmal atrial fibrillation) (HCC); CHA2DS2-VASc Score and unadjusted Ischemic Stroke Rate (% per year) is equal to 3.2 % stroke rate/year from a score of 3; On Eliquis   . CKD (chronic kidney disease), stage III ([place]) 09/05/2013  . Needs sleep apnea assessment 09/04/2013  . Chronic diastolic heart failure, NYHA class 1 ([place]) 09/04/2013  . Hypertriglyceridemia 03/28/2012  . Acute pancreatitis 03/27/2012  . Essential hypertension 03/27/2012  . Hyperlipidemia with target LDL less than 100 03/27/2012    Past Surgical History:  Procedure Laterality Date  . ANTERIOR CERVICAL DECOMP/DISCECTOMY FUSION  12/17/2011   Procedure: ANTERIOR CERVICAL DECOMPRESSION/DISCECTOMY FUSION 1 LEVEL;  Surgeon: Erline Levine, MD;  Location: [facility]URO ORS;  Service: Neurosurgery;  Laterality: N/A;  Exploration of Fusion and Anterior Cervical Six-Seven Decompression/Diskectomy/Fusion  . BOWEL RESECTION    . CHOLECYSTECTOMY    . HERNIA REPAIR     YEARS AGO  . LEFT HEART CATHETERIZATION WITH CORONARY ANGIOGRAM N/A 09/06/2013   Procedure: LEFT HEART CATHETERIZATION WITH CORONARY ANGIOGRAM;  Surgeon: Leonie Man, MD;  Location: [facility] CATH LAB;  Service: Cardiovascular: 30% LAD. Otherwise nonobstructive CAD  . NECK SURGERY    . POSTERIOR CERVICAL  FUSION/FORAMINOTOMY  12/17/2011   Procedure: POSTERIOR CERVICAL FUSION/FORAMINOTOMY LEVEL 4;  Surgeon: Erline Levine, MD;  Location: [facility] NEURO ORS;  Service: Neurosurgery;;  Posterior Cervical Three-Seven Fusion  . TONSILLECTOMY    . TRANSTHORACIC ECHOCARDIOGRAM  08/2013    Moderate LVH. EF 65%. Mild LA dilation. Normal RV size. Structurally normal valves.        Home Medications    Prior to Admission medications   Medication Sig Start Date End Date  Taking? Authorizing Provider  acetaminophen (TYLENOL) 500 MG tablet Take 1,000 mg by mouth every 6 (six) hours as needed for moderate pain.    [provider]  amLODipine (NORVASC) 5 MG tablet Take 5 mg by mouth daily. 07/02/16   [provider]  apixaban (ELIQUIS) 5 MG TABS tablet Take 1 tablet (5 mg total) by mouth 2 (two) times daily. 10/01/13   Imogene Burn, PA-C  carvedilol (COREG) 25 MG tablet Take 25 mg by mouth 2 (two) times daily with a meal.    [provider]  Cholecalciferol (VITAMIN D PO) Take 1 tablet by mouth daily.    [provider]  doxazosin (CARDURA) 4 MG tablet Take 4 mg by mouth at bedtime.    [provider]  glimepiride (AMARYL) 4 MG tablet Take 4 mg by mouth daily. 05/11/16   [provider]    Family History Family History  Problem Relation Age of Onset  . Heart failure Mother   . Diabetes Mellitus II Mother   . Diabetes Mellitus II Sister     Social History Social History   Tobacco Use  . Smoking status: Never Smoker  . Smokeless tobacco: Never Used  Substance Use Topics  . Alcohol use: No  . Drug use: No     Allergies   Penicillins   Review of Systems Review of Systems  Constitutional: Positive for fatigue and fever. Negative for chills.  HENT: Positive for congestion and rhinorrhea. Negative for facial swelling.   Eyes: Negative for discharge and visual disturbance.  Respiratory: Positive for cough. Negative for shortness of breath.   Cardiovascular: Negative for chest pain and palpitations.  Gastrointestinal: Negative for abdominal pain, diarrhea and vomiting.  Musculoskeletal: Negative for arthralgias and myalgias.  Skin: Negative for color change and rash.  Neurological: Negative for tremors, syncope and headaches.  Psychiatric/Behavioral: Negative for confusion and dysphoric mood.     Physical Exam Updated Vital Signs BP (!) 155/61   Pulse 89   Temp 99.4 F (37.4 C) (Oral)    Resp 18   Ht 5\' 8"  (1.727 m)   Wt 85.7 kg   SpO2 96%   BMI 28.74 kg/m   Physical Exam Vitals signs and nursing note reviewed.  Constitutional:      Appearance: He is well-developed.  HENT:     Head: Normocephalic and atraumatic.     Comments: Swollen turbinates, posterior nasal drip, no noted sinus ttp, tm normal bilaterally.   Eyes:     Pupils: Pupils are equal, round, and reactive to light.  Neck:     Musculoskeletal: Normal range of motion and neck supple.     Vascular: No JVD.  Cardiovascular:     Rate and Rhythm: Normal rate and regular rhythm.     Heart sounds: No murmur. No friction rub. No gallop.   Pulmonary:     Effort: No respiratory distress.     Breath sounds: No wheezing.  Abdominal:     General: There is no distension.  Tenderness: There is no guarding or rebound.  Musculoskeletal: Normal range of motion.  Skin:    Coloration: Skin is not pale.     Findings: No rash.  Neurological:     Mental Status: He is alert and oriented to person, place, and time.  Psychiatric:        Behavior: Behavior normal.      ED Treatments / Results  Labs (all labs ordered are listed, but only abnormal results are displayed) Labs Reviewed  COMPREHENSIVE METABOLIC PANEL - Abnormal; Notable for the following components:      Result Value   Sodium 133 (*)    Potassium 3.4 (*)    Chloride 92 (*)    Glucose, Bld 114 (*)    Creatinine, Ser 4.64 (*)    Calcium 8.8 (*)    GFR calc non Af Amer 13 (*)    GFR calc Af Amer 15 (*)    All other components within normal limits  CBC WITH DIFFERENTIAL/PLATELET - Abnormal; Notable for the following components:   Hemoglobin 12.0 (*)    HCT 36.0 (*)    MCV 76.6 (*)    MCH 25.5 (*)    Platelets 107 (*)    Lymphs Abs 0.3 (*)    Monocytes Absolute 1.1 (*)    All other components within normal limits  URINALYSIS, ROUTINE W REFLEX MICROSCOPIC - Abnormal; Notable for the following components:   Hgb urine dipstick SMALL (*)     Protein, ur 100 (*)    Bacteria, UA RARE (*)    All other components within normal limits  LACTIC ACID, PLASMA - Abnormal; Notable for the following components:   Lactic Acid, Venous 2.1 (*)    All other components within normal limits  INFLUENZA PANEL BY PCR (TYPE A & B) - Abnormal; Notable for the following components:   Influenza A By PCR POSITIVE (*)    All other components within normal limits  CBG MONITORING, ED - Abnormal; Notable for the following components:   Glucose-Capillary 63 (*)    All other components within normal limits  CBG MONITORING, ED - Abnormal; Notable for the following components:   Glucose-Capillary 120 (*)    All other components within normal limits  CBG MONITORING, ED - Abnormal; Notable for the following components:   Glucose-Capillary 118 (*)    All other components within normal limits  CULTURE, BLOOD (ROUTINE X 2)  CULTURE, BLOOD (ROUTINE X 2)  URINE CULTURE  LACTIC ACID, PLASMA    EKG EKG Interpretation  Date/Time:  Wednesday April 05 2018 17:57:21 EST Ventricular Rate:  104 PR Interval:    QRS Duration: 95 QT Interval:  303 QTC Calculation: 399 R Axis:   53 Text Interpretation:  Sinus tachycardia Left atrial enlargement RSR' in V1 or V2, right VCD or RVH Abnormal T, consider ischemia, lateral leads Baseline wander in lead(s) V4 No significant change since last tracing Confirmed by Deno Etienne [phone]) on 04/05/2018 6:27:39 PM   Radiology Dg Chest 2 View  Result Date: 04/05/2018 CLINICAL DATA:  Cough and dizziness.  Renal failure. EXAM: CHEST - 2 VIEW COMPARISON:  September 28, 2016. FINDINGS: No edema or consolidation. Heart size and pulmonary vascularity are normal. No adenopathy. Postoperative changes noted in the lower cervical spine region. IMPRESSION: No edema or consolidation. Electronically Signed   By: Lowella Grip III M.D.   On: 04/05/2018 19:03    Procedures Procedures (including critical care time)  Medications Ordered in  ED Medications  acetaminophen (TYLENOL) tablet 1,000 mg (1,000 mg Oral Given 04/05/18 1735)  oseltamivir (TAMIFLU) capsule 30 mg (30 mg Oral Given 04/05/18 2123)     Initial Impression / Assessment and Plan / ED Course  I have reviewed the triage vital signs and the nursing notes.  Pertinent labs & imaging results that were available during my care of the patient were reviewed by me and considered in my medical decision making (see chart for details).        63 yo M with a chief complaint of febrile illness.  It is influenza-like in nature.  The patient is also noted to be hypoglycemic here in the ED.  I suspect that the patient's hypoglycemia is likely due to him not having oral intake today and taking his glimepiride.  We will give him a sandwich and obtain lab work chest x-ray urine if able and reevaluate.  The patient was able to eat a sandwich, blood sugar remained elevated during his stay here.  UA is negative for infection chest x-ray viewed by me without pneumonia.  The patient has a flulike illness, his flu test did return positive.  I discussed the results with him and he would like to start Tamiflu.  Given a dose here.  Discharge home.  9:24 PM:  I have discussed the diagnosis/risks/treatment options with the patient and family and believe the pt to be eligible for discharge home to follow-up with PCP. We also discussed returning to the ED immediately if new or worsening sx occur. We discussed the sx which are most concerning (e.g., sudden worsening pain, fever, inability to tolerate by mouth) that necessitate immediate return. Medications administered to the patient during their visit and any new prescriptions provided to the patient are listed below.  Medications given during this visit Medications  acetaminophen (TYLENOL) tablet 1,000 mg (1,000 mg Oral Given 04/05/18 1735)  oseltamivir (TAMIFLU) capsule 30 mg (30 mg Oral Given 04/05/18 2123)     The patient appears reasonably  screen and/or stabilized for discharge and I doubt any other medical condition or other [facility] requiring further screening, evaluation, or treatment in the ED at this time prior to discharge.    Final Clinical Impressions(s) / ED Diagnoses   Final diagnoses:  Influenza-like illness    ED Discharge Orders         Ordered    oseltamivir (TAMIFLU) 75 MG capsule  Every 12 hours,   Status:  Discontinued     04/05/18 2040           Deno Etienne, DO 04/05/18 2124

## 2018-04-05 NOTE — ED Notes (Addendum)
CBG- 63, pt alert and oriented. Pt given OJ at this time.

## 2018-04-06 LAB — URINE CULTURE: Culture: <10000 — AB

## 2018-04-08 ENCOUNTER — Telehealth: Payer: Self-pay | Admitting: *Deleted

## 2018-04-08 NOTE — Telephone Encounter (Signed)
Received call from [place], Katrina to clarify Tamiflu, received two Rx for 75 mg and 30 mg. Contacted ED MD, spoke to Dr. Billy Fischer MD, pt is to receive 30 mg (renal dosing) x 3 tabs after his HD treatments. Contacted [facility] to clarify Tamiflu Rx. Jonnie Finner RN CCM Case Mgmt phone [phone]

## 2018-04-10 LAB — CULTURE, BLOOD (ROUTINE X 2)
Culture: NO GROWTH
Culture: NO GROWTH
Special Requests: ADEQUATE
Special Requests: ADEQUATE

## 2018-08-19 ENCOUNTER — Other Ambulatory Visit: Payer: Self-pay

## 2018-08-19 DIAGNOSIS — Z20822 Contact with and (suspected) exposure to covid-19: Secondary | ICD-10-CM

## 2018-08-25 LAB — NOVEL CORONAVIRUS, NAA: SARS-CoV-2, NAA: NOT DETECTED

## 2018-12-16 ENCOUNTER — Other Ambulatory Visit: Payer: Self-pay

## 2018-12-16 DIAGNOSIS — Z20822 Contact with and (suspected) exposure to covid-19: Secondary | ICD-10-CM

## 2018-12-17 LAB — NOVEL CORONAVIRUS, NAA: SARS-CoV-2, NAA: NOT DETECTED

## 2019-04-13 ENCOUNTER — Encounter: Payer: Self-pay | Admitting: General Practice

## 2020-02-05 IMAGING — DX DG CHEST 2V
2 series · 2 of 2 positions shown · non-contrast
Comparison: September 28, 2016.

CLINICAL DATA: Cough and dizziness.  Renal failure.

EXAM:
CHEST - 2 VIEW

[w chest pa]
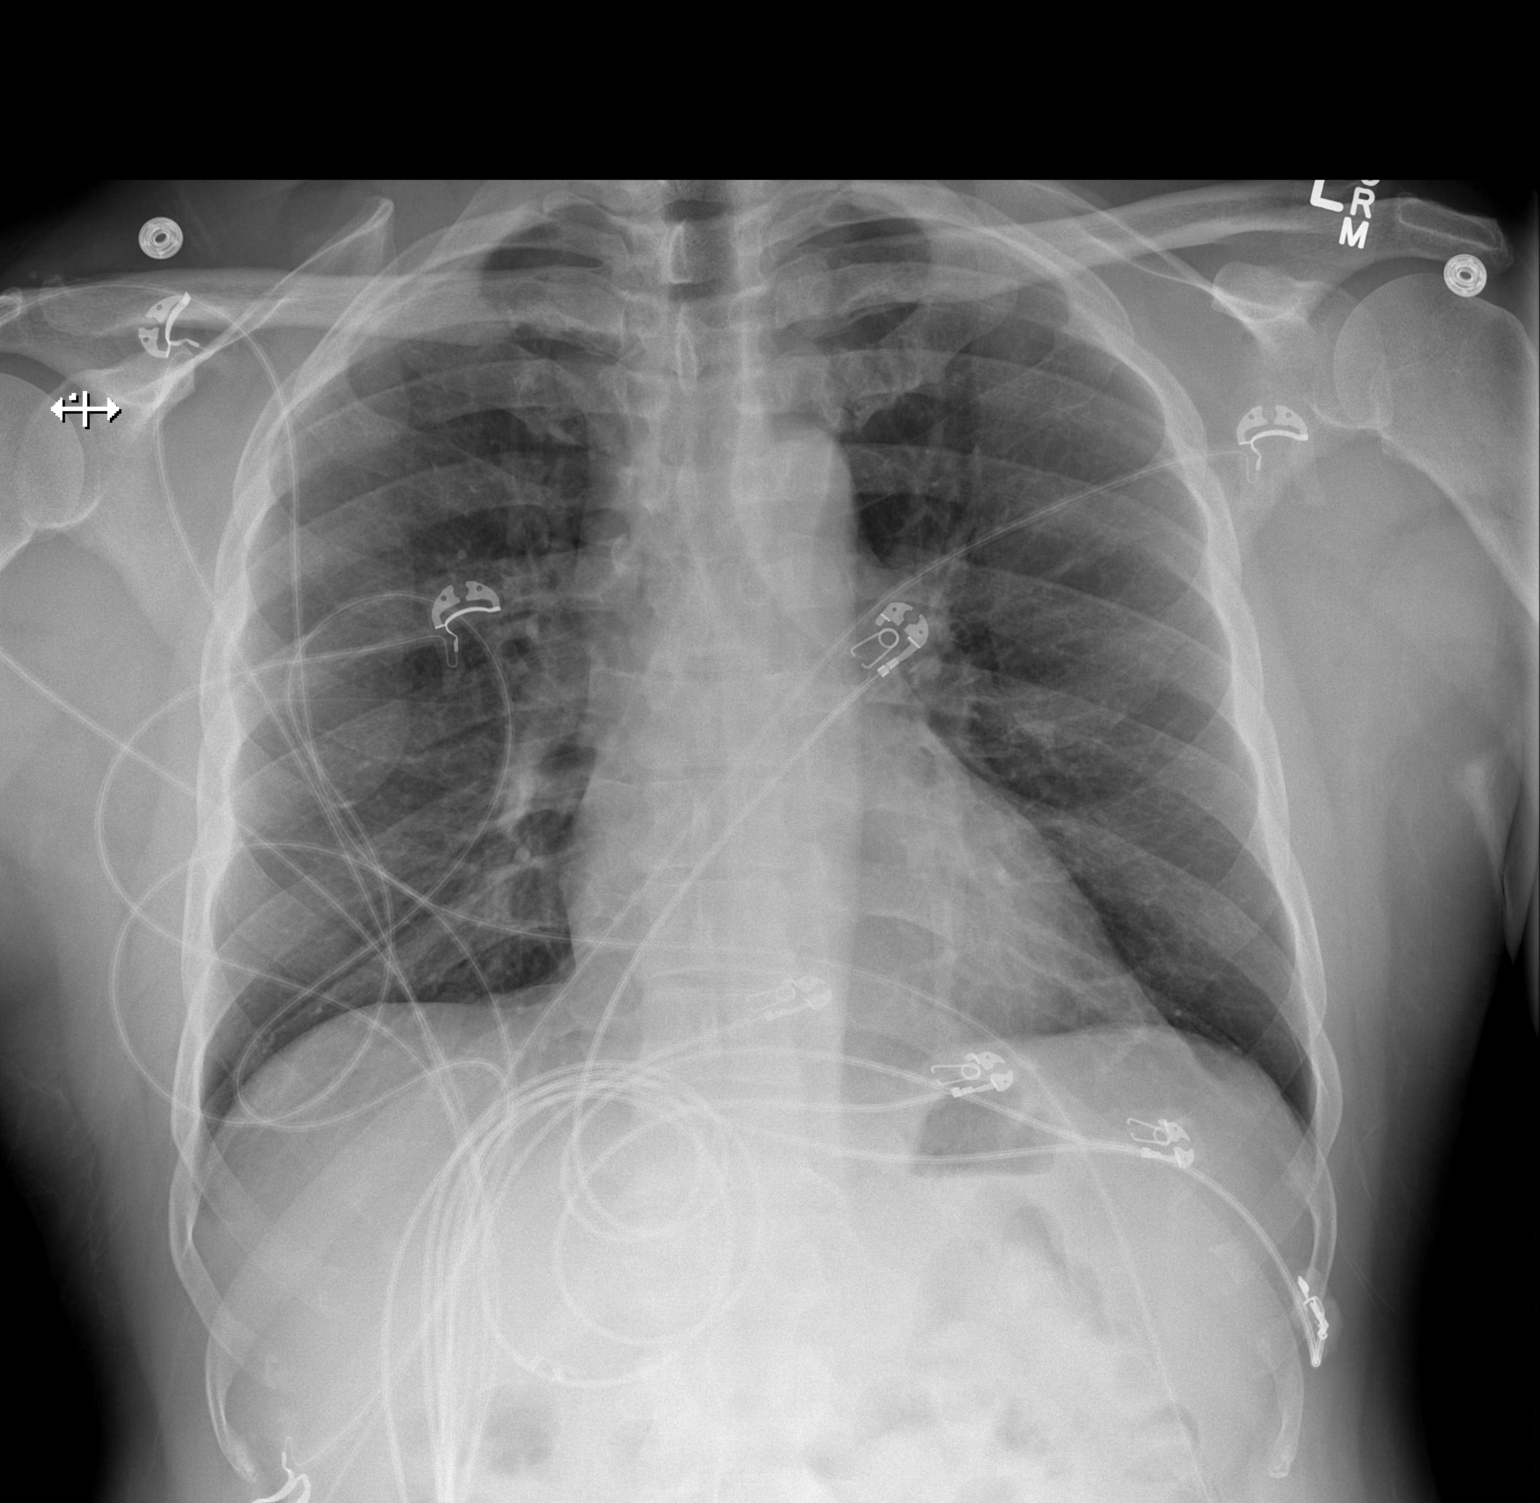

[w chest lat]
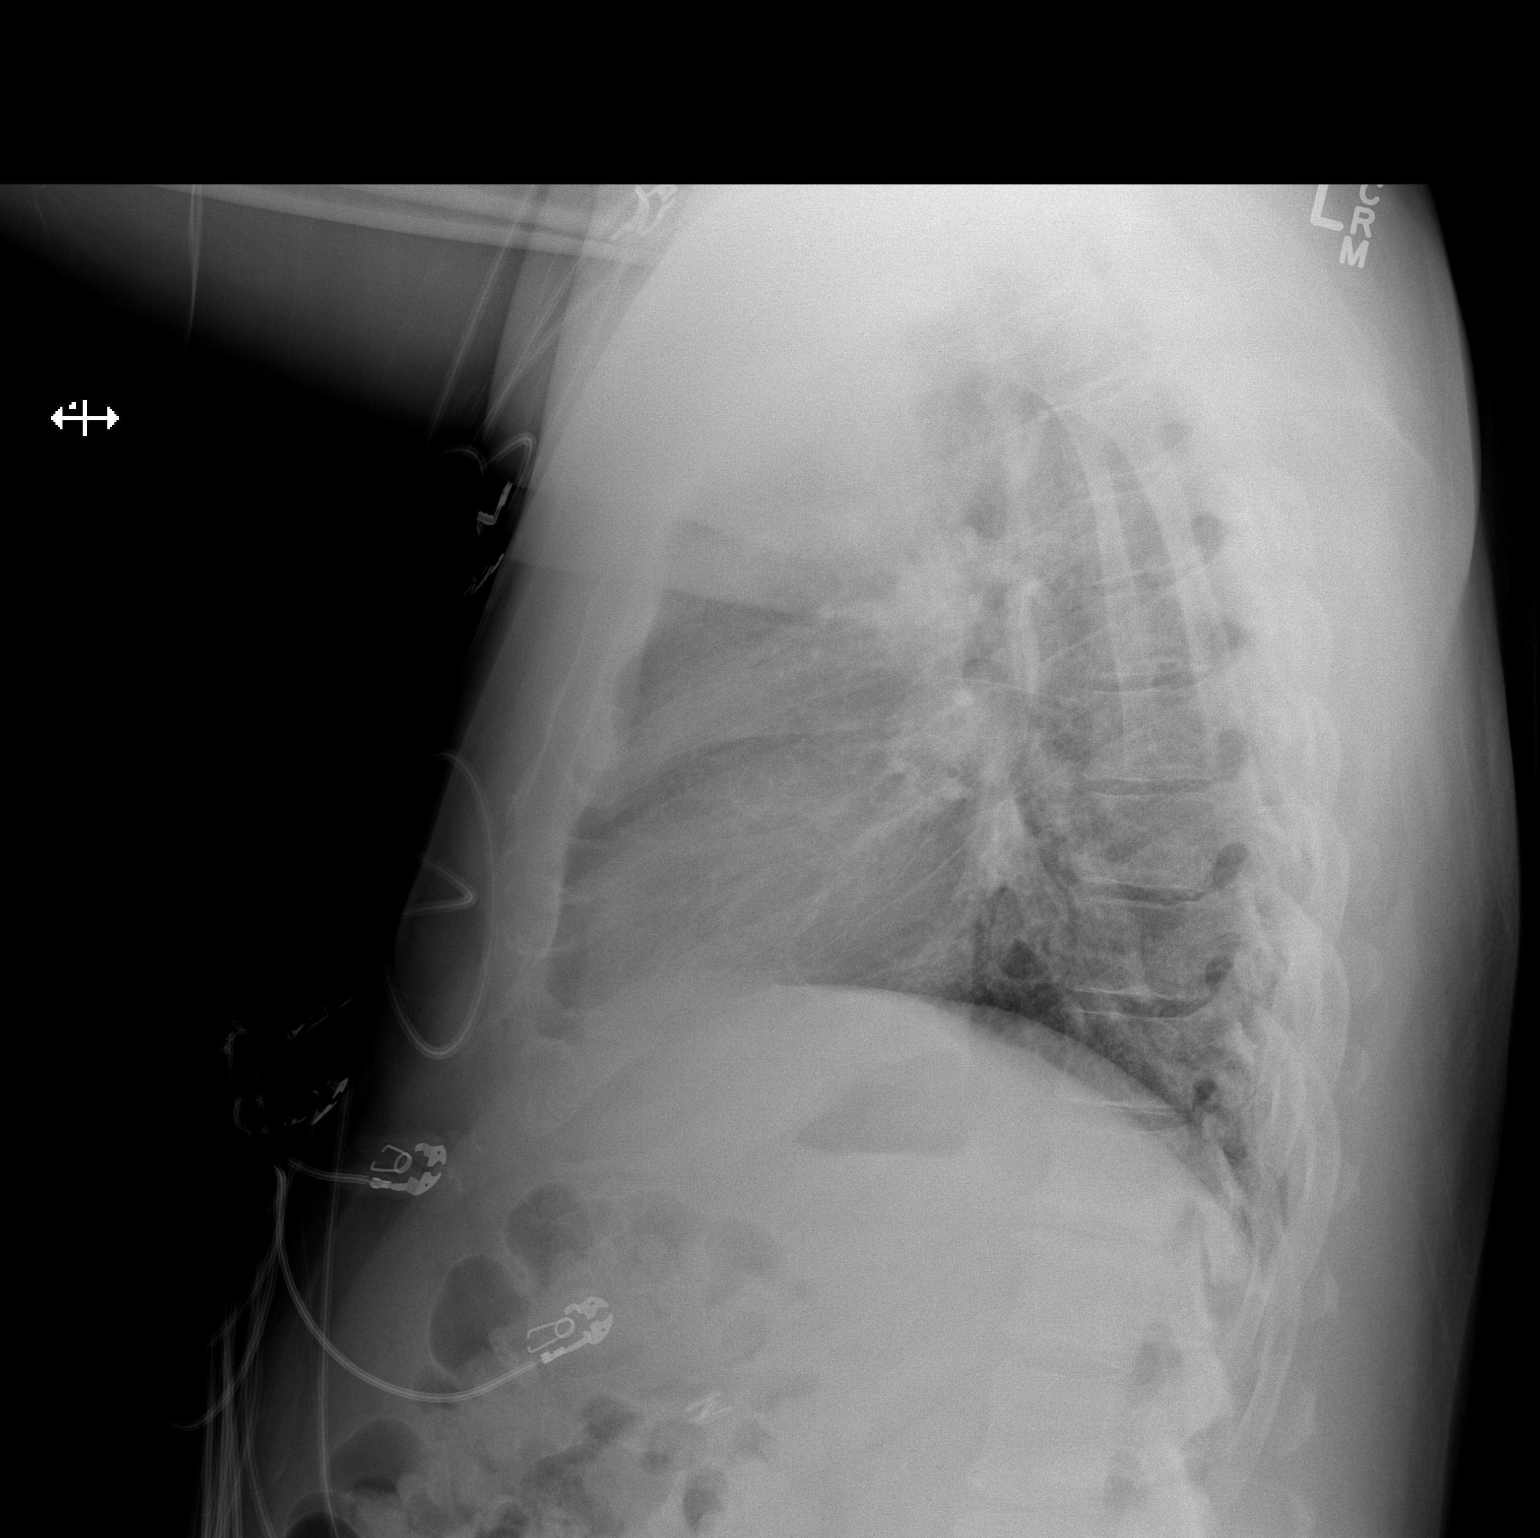

[2 of 2 positions shown; findings below may reference images not displayed]

FINDINGS: No edema or consolidation. Heart size and pulmonary vascularity are
normal. No adenopathy. Postoperative changes noted in the lower
cervical spine region.
IMPRESSION: No edema or consolidation.

## 2021-03-08 ENCOUNTER — Other Ambulatory Visit: Payer: Self-pay

## 2021-03-08 ENCOUNTER — Emergency Department (HOSPITAL_BASED_OUTPATIENT_CLINIC_OR_DEPARTMENT_OTHER): Payer: No Typology Code available for payment source

## 2021-03-08 ENCOUNTER — Emergency Department (HOSPITAL_BASED_OUTPATIENT_CLINIC_OR_DEPARTMENT_OTHER)
Admission: EM | Admit: 2021-03-08 | Discharge: 2021-03-08 | Disposition: A | Discharge status: Home / Self Care | Payer: No Typology Code available for payment source | Attending: Emergency Medicine | Admitting: Emergency Medicine

## 2021-03-08 ENCOUNTER — Encounter (HOSPITAL_BASED_OUTPATIENT_CLINIC_OR_DEPARTMENT_OTHER): Payer: Self-pay | Admitting: Emergency Medicine

## 2021-03-08 DIAGNOSIS — Z7901 Long term (current) use of anticoagulants: Secondary | ICD-10-CM | POA: Insufficient documentation

## 2021-03-08 DIAGNOSIS — U071 COVID-19: Secondary | ICD-10-CM | POA: Insufficient documentation

## 2021-03-08 DIAGNOSIS — Z79899 Other long term (current) drug therapy: Secondary | ICD-10-CM | POA: Diagnosis not present

## 2021-03-08 DIAGNOSIS — Z992 Dependence on renal dialysis: Secondary | ICD-10-CM | POA: Diagnosis not present

## 2021-03-08 DIAGNOSIS — N186 End stage renal disease: Secondary | ICD-10-CM | POA: Diagnosis not present

## 2021-03-08 DIAGNOSIS — R059 Cough, unspecified: Secondary | ICD-10-CM | POA: Diagnosis present

## 2021-03-08 LAB — RESP PANEL BY RT-PCR (FLU A&B, COVID) ARPGX2
Influenza A by PCR: NEGATIVE
Influenza B by PCR: NEGATIVE
SARS Coronavirus 2 by RT PCR: POSITIVE — AB

## 2021-03-08 MED ORDER — ACETAMINOPHEN 500 MG PO TABS
1000.0000 mg | ORAL_TABLET | Freq: Once | ORAL | Status: AC
  Administered 2021-03-08: 1000 mg via ORAL
  Filled 2021-03-08: qty 2

## 2021-03-08 MED ORDER — IBUPROFEN 400 MG PO TABS: 400.0000 mg | ORAL_TABLET | Freq: Once | ORAL | Status: DC

## 2021-03-08 MED ORDER — MOLNUPIRAVIR EUA 200MG CAPSULE: 4.0000 | ORAL_CAPSULE | Freq: Two times a day (BID) | ORAL | 0 refills | Status: AC

## 2021-03-08 NOTE — Discharge Instructions (Signed)
It was our pleasure to provide your ER care today - we hope that you feel better.  Your covid test is positive - see attached info. Take molnupiravir as prescribed. Take acetaminophen as need.   Return to ER if worse, new symptoms, increased trouble breathing, or other concern.

## 2021-03-08 NOTE — ED Provider Notes (Signed)
[facility] Provider Note   CSN: [id number] Arrival date & time: 03/08/21  1459     History  Chief Complaint  Patient presents with   Cough   Fever    Barry Lopez is a 66 y.o. male.  Patient c/o fever and non productive cough in past 3 days. Symptoms acute onset, moderate, persistent. No specific known ill contacts or known covid or flu exposure. +body aches. Nasal congestion. No sore throat or trouble swallowing. No severe headaches. No neck pain or stiffness. No sob. No chest pain other than soreness w coughing episode. No leg pain or swelling. Had normal dialysis Friday.   The history is provided by the patient, medical records and a relative.  Cough Associated symptoms: fever and myalgias   Associated symptoms: no chest pain, no headaches, no rash, no shortness of breath and no sore throat   Fever Associated symptoms: congestion, cough and myalgias   Associated symptoms: no chest pain, no confusion, no dysuria, no headaches, no nausea, no rash, no sore throat and no vomiting       Home Medications Prior to Admission medications   Medication Sig Start Date End Date Taking? Authorizing Provider  acetaminophen (TYLENOL) 500 MG tablet Take 1,000 mg by mouth every 6 (six) hours as needed for moderate pain.    [provider]  amLODipine (NORVASC) 5 MG tablet Take 5 mg by mouth daily. 07/02/16   [provider]  apixaban (ELIQUIS) 5 MG TABS tablet Take 1 tablet (5 mg total) by mouth 2 (two) times daily. 10/01/13   Imogene Burn, PA-C  carvedilol (COREG) 25 MG tablet Take 25 mg by mouth 2 (two) times daily with a meal.    [provider]  Cholecalciferol (VITAMIN D PO) Take 1 tablet by mouth daily.    [provider]  doxazosin (CARDURA) 4 MG tablet Take 4 mg by mouth at bedtime.    [provider]  glimepiride (AMARYL) 4 MG tablet Take 4 mg by mouth daily. 05/11/16   [provider]   oseltamivir (TAMIFLU) 30 MG capsule Take a dose of this medicine after you finish dialysis. 04/05/18   Deno Etienne, DO      Allergies    Penicillins    Review of Systems   Review of Systems  Constitutional:  Positive for fever.  HENT:  Positive for congestion. Negative for sore throat.   Eyes:  Negative for redness.  Respiratory:  Positive for cough. Negative for shortness of breath.   Cardiovascular:  Negative for chest pain and leg swelling.  Gastrointestinal:  Negative for abdominal pain, nausea and vomiting.  Genitourinary:  Negative for dysuria and flank pain.  Musculoskeletal:  Positive for myalgias. Negative for neck pain and neck stiffness.  Skin:  Negative for rash.  Neurological:  Negative for headaches.  Hematological:  Negative for adenopathy.  Psychiatric/Behavioral:  Negative for confusion.    Physical Exam Updated Vital Signs BP 112/61 (BP Location: Right Arm)    Pulse 87    Temp 100 F (37.8 C) (Oral)    Resp 17    SpO2 97%  Physical Exam Vitals and nursing note reviewed.  Constitutional:      Appearance: Normal appearance. He is well-developed.  HENT:     Head: Atraumatic.     Right Ear: Tympanic membrane normal.     Left Ear: Tympanic membrane normal.     Nose: Congestion present.     Mouth/Throat:  Mouth: Mucous membranes are moist.     Pharynx: Oropharynx is clear. No oropharyngeal exudate or posterior oropharyngeal erythema.  Eyes:     General: No scleral icterus.    Conjunctiva/sclera: Conjunctivae normal.     Pupils: Pupils are equal, round, and reactive to light.  Neck:     Trachea: No tracheal deviation.     Comments: No stiffness or rigidity Cardiovascular:     Rate and Rhythm: Normal rate and regular rhythm.     Pulses: Normal pulses.     Heart sounds: Normal heart sounds. No murmur heard.   No friction rub. No gallop.  Pulmonary:     Effort: Pulmonary effort is normal. No accessory muscle usage or respiratory distress.     Breath  sounds: Normal breath sounds.  Abdominal:     General: Bowel sounds are normal. There is no distension.     Palpations: Abdomen is soft.     Tenderness: There is no abdominal tenderness.  Genitourinary:    Comments: No cva tenderness. Musculoskeletal:        General: No swelling or tenderness.     Cervical back: Normal range of motion and neck supple. No rigidity.     Right lower leg: No edema.     Left lower leg: No edema.     Comments: LUE dialysis access w palp thrill and without sign of infection  Lymphadenopathy:     Cervical: No cervical adenopathy.  Skin:    General: Skin is warm and dry.     Findings: No rash.  Neurological:     Mental Status: He is alert.     Comments: Alert, speech clear.   Psychiatric:        Mood and Affect: Mood normal.    ED Results / Procedures / Treatments   Labs (all labs ordered are listed, but only abnormal results are displayed) Labs Reviewed  RESP PANEL BY RT-PCR (FLU A&B, COVID) ARPGX2 - Abnormal; Notable for the following components:      Result Value   SARS Coronavirus 2 by RT PCR POSITIVE (*)    All other components within normal limits    EKG None  Radiology DG Chest Port 1 View  Result Date: 03/08/2021 CLINICAL DATA:  Cough EXAM: PORTABLE CHEST 1 VIEW.  Patient is slightly rotated. COMPARISON:  Chest x-ray 04/05/2018 FINDINGS: The heart and mediastinal contours are within normal limits. No focal consolidation. No pulmonary edema. No pleural effusion. No pneumothorax. No acute osseous abnormality. Cervical surgical hardware partially visualized. IMPRESSION: No active disease. Electronically Signed   By: Iven Finn M.D.   On: 03/08/2021 16:07    Procedures Procedures    Medications Ordered in ED Medications  acetaminophen (TYLENOL) tablet 1,000 mg (has no administration in time range)    ED Course/ Medical Decision Making/ A&P                           Medical Decision Making Problems Addressed: COVID-19 virus  infection: acute illness or injury with systemic symptoms that poses a threat to life or bodily functions ESRD on dialysis [facility]): chronic illness or injury that poses a threat to life or bodily functions  Amount and/or Complexity of Data Reviewed Independent Historian:     Details: family/additional hx. External Data Reviewed: labs and notes. Labs: ordered. Decision-making details documented in ED Course. Radiology: ordered and independent interpretation performed. Decision-making details documented in ED Course.  Risk OTC drugs. Prescription  drug management.   Labs and imaging ordered.   Reviewed nursing notes and prior charts for additional history.  External reports reviewed. Additional hx from family.  Labs reviewed/interpreted by me - covid pos.   CXR reviewed/interpreted by me - no pna.   Acetaminophen po.   Pt is breathing comfortably, pulse ox 98%.   Rx for home.   Return precautions provided.           Final Clinical Impression(s) / ED Diagnoses Final diagnoses:  None    Rx / DC Orders ED Discharge Orders     None         Lajean Saver, MD 03/08/21 [phone]

## 2021-03-08 NOTE — ED Notes (Signed)
Ambulated from lobby to room 11, SpO2 95-100%, HR 95-100.

## 2021-03-08 NOTE — ED Notes (Signed)
Pt given ginger ale per request

## 2021-03-08 NOTE — ED Triage Notes (Signed)
Pt POV from home c/o chills, fevers, body aches, cough since Friday. Denies cp. Took tylenol with no relief. Pt ambulatory to room.

## 2022-01-21 ENCOUNTER — Emergency Department (HOSPITAL_BASED_OUTPATIENT_CLINIC_OR_DEPARTMENT_OTHER)
Admission: EM | Admit: 2022-01-21 | Discharge: 2022-01-21 | Disposition: A | Discharge status: Home / Self Care | Payer: No Typology Code available for payment source | Attending: Emergency Medicine | Admitting: Emergency Medicine

## 2022-01-21 ENCOUNTER — Emergency Department (HOSPITAL_BASED_OUTPATIENT_CLINIC_OR_DEPARTMENT_OTHER): Payer: No Typology Code available for payment source

## 2022-01-21 ENCOUNTER — Other Ambulatory Visit (HOSPITAL_BASED_OUTPATIENT_CLINIC_OR_DEPARTMENT_OTHER): Payer: Self-pay

## 2022-01-21 ENCOUNTER — Encounter (HOSPITAL_BASED_OUTPATIENT_CLINIC_OR_DEPARTMENT_OTHER): Payer: Self-pay | Admitting: Emergency Medicine

## 2022-01-21 ENCOUNTER — Other Ambulatory Visit: Payer: Self-pay

## 2022-01-21 DIAGNOSIS — I4891 Unspecified atrial fibrillation: Secondary | ICD-10-CM | POA: Insufficient documentation

## 2022-01-21 DIAGNOSIS — Z79899 Other long term (current) drug therapy: Secondary | ICD-10-CM | POA: Insufficient documentation

## 2022-01-21 DIAGNOSIS — I120 Hypertensive chronic kidney disease with stage 5 chronic kidney disease or end stage renal disease: Secondary | ICD-10-CM | POA: Diagnosis not present

## 2022-01-21 DIAGNOSIS — R079 Chest pain, unspecified: Secondary | ICD-10-CM | POA: Diagnosis not present

## 2022-01-21 DIAGNOSIS — B349 Viral infection, unspecified: Secondary | ICD-10-CM | POA: Insufficient documentation

## 2022-01-21 DIAGNOSIS — R059 Cough, unspecified: Secondary | ICD-10-CM | POA: Diagnosis present

## 2022-01-21 DIAGNOSIS — Z992 Dependence on renal dialysis: Secondary | ICD-10-CM | POA: Diagnosis not present

## 2022-01-21 DIAGNOSIS — Z7901 Long term (current) use of anticoagulants: Secondary | ICD-10-CM | POA: Insufficient documentation

## 2022-01-21 DIAGNOSIS — N186 End stage renal disease: Secondary | ICD-10-CM | POA: Diagnosis not present

## 2022-01-21 DIAGNOSIS — Z7984 Long term (current) use of oral hypoglycemic drugs: Secondary | ICD-10-CM | POA: Insufficient documentation

## 2022-01-21 DIAGNOSIS — E1122 Type 2 diabetes mellitus with diabetic chronic kidney disease: Secondary | ICD-10-CM | POA: Diagnosis not present

## 2022-01-21 DIAGNOSIS — R051 Acute cough: Secondary | ICD-10-CM

## 2022-01-21 DIAGNOSIS — Z20822 Contact with and (suspected) exposure to covid-19: Secondary | ICD-10-CM | POA: Insufficient documentation

## 2022-01-21 LAB — CBC WITH DIFFERENTIAL/PLATELET
Abs Immature Granulocytes: 0.03 10*3/uL (ref 0.00–0.07)
Basophils Absolute: 0 10*3/uL (ref 0.0–0.1)
Basophils Relative: 1 %
Eosinophils Absolute: 0.5 10*3/uL (ref 0.0–0.5)
Eosinophils Relative: 5 %
HCT: 33.2 % — ABNORMAL LOW (ref 39.0–52.0)
Hemoglobin: 11.8 g/dL — ABNORMAL LOW (ref 13.0–17.0)
Immature Granulocytes: 0 %
Lymphocytes Relative: 13 %
Lymphs Abs: 1.1 10*3/uL (ref 0.7–4.0)
MCH: 29.1 pg (ref 26.0–34.0)
MCHC: 35.5 g/dL (ref 30.0–36.0)
MCV: 81.8 fL (ref 80.0–100.0)
Monocytes Absolute: 1.3 10*3/uL — ABNORMAL HIGH (ref 0.1–1.0)
Monocytes Relative: 15 %
Neutro Abs: 5.8 10*3/uL (ref 1.7–7.7)
Neutrophils Relative %: 66 %
Platelets: 171 10*3/uL (ref 150–400)
RBC: 4.06 MIL/uL — ABNORMAL LOW (ref 4.22–5.81)
RDW: 13.6 % (ref 11.5–15.5)
WBC: 8.8 10*3/uL (ref 4.0–10.5)
nRBC: 0 % (ref 0.0–0.2)

## 2022-01-21 LAB — COMPREHENSIVE METABOLIC PANEL
ALT: 18 U/L (ref 0–44)
AST: 21 U/L (ref 15–41)
Albumin: 3.9 g/dL (ref 3.5–5.0)
Alkaline Phosphatase: 61 U/L (ref 38–126)
Anion gap: 11 (ref 5–15)
BUN: 42 mg/dL — ABNORMAL HIGH (ref 8–23)
CO2: 27 mmol/L (ref 22–32)
Calcium: 9.1 mg/dL (ref 8.9–10.3)
Chloride: 96 mmol/L — ABNORMAL LOW (ref 98–111)
Creatinine, Ser: 7.95 mg/dL — ABNORMAL HIGH (ref 0.61–1.24)
GFR, Estimated: 7 mL/min — ABNORMAL LOW (ref >60)
Glucose, Bld: 124 mg/dL — ABNORMAL HIGH (ref 70–99)
Potassium: 4.5 mmol/L (ref 3.5–5.1)
Sodium: 134 mmol/L — ABNORMAL LOW (ref 135–145)
Total Bilirubin: 0.7 mg/dL (ref 0.3–1.2)
Total Protein: 8.1 g/dL (ref 6.5–8.1)

## 2022-01-21 LAB — RESP PANEL BY RT-PCR (RSV, FLU A&B, COVID)  RVPGX2
Influenza A by PCR: NEGATIVE
Influenza B by PCR: NEGATIVE
Resp Syncytial Virus by PCR: NEGATIVE
SARS Coronavirus 2 by RT PCR: NEGATIVE

## 2022-01-21 LAB — LIPASE, BLOOD: Lipase: 31 U/L (ref 11–51)

## 2022-01-21 MED ORDER — ONDANSETRON HCL 4 MG PO TABS
4.0000 mg | ORAL_TABLET | Freq: Four times a day (QID) | ORAL | 0 refills | Status: AC
  Filled 2022-01-21: qty 10, 2d supply, fill #0
  Filled 2022-01-21: qty 12, 3d supply, fill #0
  Filled 2022-01-21: qty 2, 1d supply, fill #0

## 2022-01-21 MED ORDER — BENZONATATE 100 MG PO CAPS
100.0000 mg | ORAL_CAPSULE | Freq: Three times a day (TID) | ORAL | 0 refills | Status: AC
  Filled 2022-01-21: qty 21, 7d supply, fill #0

## 2022-01-21 NOTE — ED Triage Notes (Signed)
Pt is c/o chills, chest hurting, cough, body aches  Sxs started a couple days ago  Pt also states he has low grade fever

## 2022-01-21 NOTE — Discharge Instructions (Addendum)
You were seen in the emergency department for your cough and body aches.  Your workup showed no signs of pneumonia and no significant electrolyte abnormalities.  You likely have a viral infection causing your symptoms and doses and was treated based on what symptoms that you are having.  You can take Tylenol as needed for headaches or bodyaches and Zofran as needed for nausea.  You should make sure that you are drinking plenty of fluids and you can follow-up with your primary doctor in the next few days to have your symptoms rechecked.  You should return to the emergency department if you have significantly worsening shortness of breath, worsening fevers, you pass out or if you have any other new or concerning symptoms.

## 2022-01-21 NOTE — ED Provider Notes (Signed)
[facility] Provider Note   CSN: [id number] Arrival date & time: 01/21/22  0554     History  Chief Complaint  Patient presents with   Weakness    Barry Lopez is a 66 y.o. male.  Is a 67 year old male with a past medical history of ESRD on MWF HD, hypertension, diabetes and A-fib on Eliquis presenting to the emergency department with cough and bodyaches.  States that he has been having a nonproductive cough for the past 3 to 4 days.  He states that he has some associated chest pain.  He states that he has had low-grade fevers of 99 and has had chills.  He states that he has been nauseous but denies any vomiting.  He denies any diarrhea or constipation.  He denies any known sick contacts.  He states he had his last full dialysis session yesterday.  The history is provided by the patient and the spouse.       Home Medications Prior to Admission medications   Medication Sig Start Date End Date Taking? Authorizing Provider  benzonatate (TESSALON) 100 MG capsule Take 1 capsule (100 mg total) by mouth every 8 (eight) hours. 01/21/22  Yes Maylon Peppers, [place] K, DO  ondansetron (ZOFRAN) 4 MG tablet Take 1 tablet (4 mg total) by mouth every 6 (six) hours. 01/21/22  Yes Leanord Asal K, DO  acetaminophen (TYLENOL) 500 MG tablet Take 1,000 mg by mouth every 6 (six) hours as needed for moderate pain.    [provider]  amLODipine (NORVASC) 5 MG tablet Take 5 mg by mouth daily. 07/02/16   [provider]  apixaban (ELIQUIS) 5 MG TABS tablet Take 1 tablet (5 mg total) by mouth 2 (two) times daily. 10/01/13   Imogene Burn, PA-C  carvedilol (COREG) 25 MG tablet Take 25 mg by mouth 2 (two) times daily with a meal.    [provider]  Cholecalciferol (VITAMIN D PO) Take 1 tablet by mouth daily.    [provider]  doxazosin (CARDURA) 4 MG tablet Take 4 mg by mouth at bedtime.    [provider]  glimepiride  (AMARYL) 4 MG tablet Take 4 mg by mouth daily. 05/11/16   [provider]  oseltamivir (TAMIFLU) 30 MG capsule Take a dose of this medicine after you finish dialysis. 04/05/18   Deno Etienne, DO      Allergies    Penicillins    Review of Systems   Review of Systems  Physical Exam Updated Vital Signs BP 133/67   Pulse 80   Temp 98.7 F (37.1 C) (Oral)   Resp (!) 21   Ht '5\' 7"'$  (1.702 m)   Wt 83.9 kg   SpO2 99%   BMI 28.98 kg/m  Physical Exam Vitals and nursing note reviewed.  Constitutional:      General: He is not in acute distress.    Appearance: Normal appearance.  HENT:     Head: Normocephalic and atraumatic.     Nose: Nose normal.     Mouth/Throat:     Mouth: Mucous membranes are moist.     Pharynx: Oropharynx is clear.  Eyes:     Extraocular Movements: Extraocular movements intact.     Conjunctiva/sclera: Conjunctivae normal.  Cardiovascular:     Rate and Rhythm: Normal rate and regular rhythm.     Heart sounds: Normal heart sounds.  Pulmonary:     Effort: Pulmonary effort is normal.     Breath sounds:  Normal breath sounds.  Abdominal:     General: Abdomen is flat.     Palpations: Abdomen is soft.     Tenderness: There is no abdominal tenderness.  Musculoskeletal:        General: Normal range of motion.     Cervical back: Normal range of motion and neck supple.     Right lower leg: No edema.     Left lower leg: No edema.  Skin:    General: Skin is warm and dry.  Neurological:     General: No focal deficit present.     Mental Status: He is alert and oriented to person, place, and time.  Psychiatric:        Mood and Affect: Mood normal.        Behavior: Behavior normal.     ED Results / Procedures / Treatments   Labs (all labs ordered are listed, but only abnormal results are displayed) Labs Reviewed  COMPREHENSIVE METABOLIC PANEL - Abnormal; Notable for the following components:      Result Value   Sodium 134 (*)    Chloride 96 (*)     Glucose, Bld 124 (*)    BUN 42 (*)    Creatinine, Ser 7.95 (*)    GFR, Estimated 7 (*)    All other components within normal limits  CBC WITH DIFFERENTIAL/PLATELET - Abnormal; Notable for the following components:   RBC 4.06 (*)    Hemoglobin 11.8 (*)    HCT 33.2 (*)    Monocytes Absolute 1.3 (*)    All other components within normal limits  RESP PANEL BY RT-PCR (RSV, FLU A&B, COVID)  RVPGX2  LIPASE, BLOOD    EKG None  Radiology DG Chest 2 View  Result Date: 01/21/2022 CLINICAL DATA:  Cough and chest pain EXAM: CHEST - 2 VIEW COMPARISON:  03/08/2021 FINDINGS: Normal heart size and mediastinal contours. Left subclavian stent. No acute infiltrate or edema. No effusion or pneumothorax. No acute osseous findings. ACDF and posterior arthrodesis hardware in the cervical spine. Cholecystectomy clips. IMPRESSION: Negative chest. Electronically Signed   By: Jorje Guild M.D.   On: 01/21/2022 07:41    Procedures Procedures    Medications Ordered in ED Medications - No data to display  ED Course/ Medical Decision Making/ A&P Clinical Course as of 01/21/22 0935  Thu Jan 21, 2022  0934 Upon reassessment, the patient's labs and x-ray are negative.  Patient likely has viral syndrome and is stable for discharge home with primary care follow-up.  He was given strict return precautions. [VK]    Clinical Course User Index [VK] Kemper Durie, DO                           Medical Decision Making This patient presents to the ED with chief complaint(s) of chest pain, cough, body aches with pertinent past medical history of ESRD on MWF HD, diabetes, hypertension, A-fib on Eliquis which further complicates the presenting complaint. The complaint involves an extensive differential diagnosis and also carries with it a high risk of complications and morbidity.    The differential diagnosis includes viral syndrome, pneumonia, ACS, anemia, electrolyte abnormality, pulmonary edema, pleural  effusion  Additional history obtained: Additional history obtained from spouse Records reviewed Care Everywhere/External Records  ED Course and Reassessment: On patient's arrival to the emergency department he is awake alert well-appearing satting well on room air.  Due to patient's symptoms and significant medical history,  he will have EKG, labs, chest x-ray and viral swab performed to evaluate for cause of his symptoms.  He declined any nausea or pain medication at this time.  Independent labs interpretation:  The following labs were independently interpreted: at baseline/no acute abnormalities  Independent visualization of imaging: - I independently visualized the following imaging with scope of interpretation limited to determining acute life threatening conditions related to emergency care: CXR, which revealed no acute disease  Consultation: - Consulted or discussed management/test interpretation w/ external professional: N/A  Consideration for admission or further workup: Patient has no emergent conditions requiring admission or further work-up at this time and is stable for discharge home with primary care follow-up  Social Determinants of health: N/A    Amount and/or Complexity of Data Reviewed Labs: ordered. Radiology: ordered.  Risk Prescription drug management.          Final Clinical Impression(s) / ED Diagnoses Final diagnoses:  Viral syndrome  Acute cough    Rx / DC Orders ED Discharge Orders          Ordered    ondansetron (ZOFRAN) 4 MG tablet  Every 6 hours        01/21/22 0935    benzonatate (TESSALON) 100 MG capsule  Every 8 hours        01/21/22 0935              Kemper Durie, DO 01/21/22 [id number]

## 2022-01-21 NOTE — ED Notes (Signed)
Patient transported to X-ray 

## 2022-01-22 ENCOUNTER — Other Ambulatory Visit (HOSPITAL_COMMUNITY): Payer: Self-pay

## 2022-01-28 ENCOUNTER — Other Ambulatory Visit (HOSPITAL_BASED_OUTPATIENT_CLINIC_OR_DEPARTMENT_OTHER): Payer: Self-pay
